# Patient Record
Sex: Male | Born: 1950 | Race: White | Hispanic: No | Marital: Married | Smoking: Former smoker
Health system: Southern US, Community
[De-identification: ages and names within clinical notes are randomized; demographics above are authoritative.]

## PROBLEM LIST (undated history)

## (undated) DIAGNOSIS — G4730 Sleep apnea, unspecified: Secondary | ICD-10-CM

## (undated) DIAGNOSIS — I10 Essential (primary) hypertension: Secondary | ICD-10-CM

## (undated) DIAGNOSIS — M1611 Unilateral primary osteoarthritis, right hip: Secondary | ICD-10-CM

## (undated) DIAGNOSIS — M1990 Unspecified osteoarthritis, unspecified site: Secondary | ICD-10-CM

## (undated) DIAGNOSIS — I4891 Unspecified atrial fibrillation: Secondary | ICD-10-CM

## (undated) HISTORY — PX: OTHER SURGICAL HISTORY: SHX169

## (undated) HISTORY — DX: Unspecified atrial fibrillation: I48.91

---

## 2000-03-26 ENCOUNTER — Other Ambulatory Visit: Admission: RE | Admit: 2000-03-26 | Discharge: 2000-03-26 | Payer: Self-pay | Admitting: Internal Medicine

## 2000-03-26 ENCOUNTER — Encounter (INDEPENDENT_AMBULATORY_CARE_PROVIDER_SITE_OTHER): Payer: Self-pay | Admitting: Specialist

## 2000-06-24 HISTORY — PX: CARPAL TUNNEL RELEASE: SHX101

## 2001-07-20 ENCOUNTER — Ambulatory Visit (HOSPITAL_BASED_OUTPATIENT_CLINIC_OR_DEPARTMENT_OTHER): Admission: RE | Admit: 2001-07-20 | Discharge: 2001-07-20 | Payer: Self-pay | Admitting: *Deleted

## 2001-08-12 ENCOUNTER — Ambulatory Visit (HOSPITAL_BASED_OUTPATIENT_CLINIC_OR_DEPARTMENT_OTHER): Admission: RE | Admit: 2001-08-12 | Discharge: 2001-08-12 | Payer: Self-pay | Admitting: *Deleted

## 2003-01-14 ENCOUNTER — Encounter: Admission: RE | Admit: 2003-01-14 | Discharge: 2003-01-14 | Payer: Self-pay | Admitting: Infectious Diseases

## 2003-01-27 ENCOUNTER — Encounter: Admission: RE | Admit: 2003-01-27 | Discharge: 2003-01-27 | Payer: Self-pay | Admitting: Infectious Diseases

## 2004-04-21 ENCOUNTER — Encounter: Admission: RE | Admit: 2004-04-21 | Discharge: 2004-04-21 | Payer: Self-pay | Admitting: Orthopedic Surgery

## 2004-04-24 ENCOUNTER — Encounter: Admission: RE | Admit: 2004-04-24 | Discharge: 2004-04-24 | Payer: Self-pay | Admitting: Orthopedic Surgery

## 2004-06-24 HISTORY — PX: KNEE ARTHROSCOPY: SUR90

## 2004-06-24 HISTORY — PX: BACK SURGERY: SHX140

## 2005-01-22 ENCOUNTER — Ambulatory Visit (HOSPITAL_COMMUNITY): Admission: RE | Admit: 2005-01-22 | Discharge: 2005-01-22 | Payer: Self-pay | Admitting: Orthopedic Surgery

## 2005-02-01 ENCOUNTER — Encounter (HOSPITAL_COMMUNITY): Admission: RE | Admit: 2005-02-01 | Discharge: 2005-03-03 | Payer: Self-pay | Admitting: Orthopedic Surgery

## 2005-02-15 ENCOUNTER — Encounter: Admission: RE | Admit: 2005-02-15 | Discharge: 2005-02-15 | Payer: Self-pay | Admitting: Neurosurgery

## 2005-02-19 ENCOUNTER — Ambulatory Visit (HOSPITAL_COMMUNITY): Admission: RE | Admit: 2005-02-19 | Discharge: 2005-02-20 | Payer: Self-pay | Admitting: Neurosurgery

## 2005-02-28 ENCOUNTER — Inpatient Hospital Stay (HOSPITAL_COMMUNITY): Admission: RE | Admit: 2005-02-28 | Discharge: 2005-03-01 | Payer: Self-pay | Admitting: Neurosurgery

## 2005-03-04 ENCOUNTER — Inpatient Hospital Stay (HOSPITAL_COMMUNITY): Admission: AD | Admit: 2005-03-04 | Discharge: 2005-03-14 | Payer: Self-pay | Admitting: Neurosurgery

## 2005-06-05 ENCOUNTER — Ambulatory Visit: Payer: Self-pay | Admitting: Internal Medicine

## 2006-02-06 ENCOUNTER — Ambulatory Visit: Payer: Self-pay | Admitting: Cardiology

## 2008-05-06 ENCOUNTER — Emergency Department (HOSPITAL_COMMUNITY): Admission: EM | Admit: 2008-05-06 | Discharge: 2008-05-06 | Payer: Self-pay | Admitting: Emergency Medicine

## 2010-09-24 ENCOUNTER — Other Ambulatory Visit: Payer: Self-pay | Admitting: Orthopedic Surgery

## 2010-09-24 DIAGNOSIS — M25561 Pain in right knee: Secondary | ICD-10-CM

## 2010-09-27 ENCOUNTER — Other Ambulatory Visit: Payer: Self-pay

## 2010-11-01 ENCOUNTER — Other Ambulatory Visit: Payer: Self-pay | Admitting: Neurosurgery

## 2010-11-01 DIAGNOSIS — M899 Disorder of bone, unspecified: Secondary | ICD-10-CM

## 2010-11-02 ENCOUNTER — Ambulatory Visit
Admission: RE | Admit: 2010-11-02 | Discharge: 2010-11-02 | Disposition: A | Discharge status: Home / Self Care | Payer: BC Managed Care – PPO | Source: Ambulatory Visit | Attending: Neurosurgery | Admitting: Neurosurgery

## 2010-11-02 DIAGNOSIS — M899 Disorder of bone, unspecified: Secondary | ICD-10-CM

## 2010-11-09 NOTE — H&P (Signed)
Timothy Barton, Timothy Barton              ACCOUNT NO.:  [id number]   MEDICAL RECORD NO.:  [id number]          PATIENT TYPE:  INP   LOCATION:                               FACILITY:  [facility]   PHYSICIAN:  Hilda Lias, M.D.   DATE OF BIRTH:  1950/11/08   DATE OF ADMISSION:  02/28/2005  DATE OF DISCHARGE:                                HISTORY & PHYSICAL   Mr. Bhakta is a gentleman who on August 29 underwent an L4-L5 diskectomy.  Patient did really well up to the point that he went home within 24 hours.  Over the weekend the wife called me because he was having some mild  headache.  He started taking some Percocet for pain and the headache got  worse.  He noticed some fluid coming through his wound and today he is  having an awful headache with more fluid up to the point I brought him  immediately to my office.  Because of the findings we are going to admit him  to the hospital for exploration of the lumbar wound.   PAST MEDICAL HISTORY:  Hand surgery, knee surgery, diskectomy a few days  ago.  He is not allergic to medication.   SOCIAL HISTORY:  Negative.   FAMILY HISTORY:  Unremarkable.   Patient is 5 feet 9 inches.  He is 330 pounds.   PHYSICAL EXAMINATION:  HEENT:  Unremarkable.  NECK:  Unremarkable.  CARDIOVASCULAR:  Unremarkable.  LUNGS:  Unremarkable.  ABDOMEN:  Unremarkable.  EXTREMITIES:  Normal pulses.  NEUROLOGIC:  Mental status unremarkable.  Cranial nerves unremarkable.  Strength unremarkable.  Sensation unremarkable.   IMPRESSION:  The lumbar wound shows that it is opening anterior and superior  and there is some CSF coming through.   CLINICAL IMPRESSION:  CSF leak.   RECOMMENDATIONS:  The patient has not eaten anything since this morning.  We  are going to admit to the hospital and take him for exploration of the  lumbar wound in the next few hours.           ______________________________  Hilda Lias, M.D.    EB/MEDQ  D:  02/27/2005  T:  02/27/2005   Job:  [id number]

## 2010-11-09 NOTE — Op Note (Signed)
[place]  Patient:    Timothy Barton, Timothy Barton Visit Number: [id number] MRN: [id number]          Service Type: DSU Location: [facility] Attending Physician:  Kendell Bane Dictated by:   Lowell Bouton, M.D. Proc. Date: 08/12/01 Admit Date:  08/12/2001 Discharge Date: 08/12/2001                             Operative Report  PREOPERATIVE DIAGNOSIS:  Right carpal tunnel syndrome.  POSTOPERATIVE DIAGNOSIS:  Right carpal tunnel syndrome.  PROCEDURE:  Decompression median nerve right carpal tunnel.  SURGEON:  Lowell Bouton, M.D.  ANESTHESIA:  0.5% Marcaine local with sedation.  OPERATIVE FINDINGS:  The patient had moderate compression of the median nerve. The were no masses identified and the motor branch was intact.  PROCEDURE:  Under 0.5% Marcaine local anesthesia with the tourniquet on the right arm, the right hand was prepped and draped in usual fashion.  After exsanguinating the limb, the tourniquet was inflated to 250 mmHg. A longitudinal incision was made in the palm just ulnar to the thenar crease measuring 3 cm in length.  Sharp dissection was carried through the subcutaneous tissues and bleeding points were coagulated.  Blunt dissection was carried through the superficial palmar fascia distal to the transverse carpal ligament and a hemostat was placed in the carpal canal up against the hook of the Hamate.   The transverse carpal ligament was then divided sharply on the ulnar border of the median nerve.  The proximal end of the ligament was divided with the scissors after dissecting the nerve away from the undersurface of the ligament.  The carpal canal was then palpated and was found to be adequately decompressed.  The nerve was examined and the motor branch identified.  The wound was irrigated with saline and the skin was closed with a 4-0 nylon suture.  Sterile dressings were applied, followed by a volar  wrist splint.  The patient tolerated the procedure well and went to the recovery room awake and stable in good condition. Dictated by:   Lowell Bouton, M.D. Attending Physician:  Kendell Bane DD:  08/12/01 TD:  08/13/01 Job: [id number]

## 2010-11-09 NOTE — Discharge Summary (Signed)
[name], [name]              ACCOUNT NO.:  [id number]   MEDICAL RECORD NO.:  [id number]          PATIENT TYPE:  INP   LOCATION:  3033                         FACILITY:  [facility]   PHYSICIAN:  Stefani Dama, M.D.  DATE OF BIRTH:  1951/03/06   DATE OF ADMISSION:  03/04/2005  DATE OF DISCHARGE:  03/14/2005                                 DISCHARGE SUMMARY   ADMITTING DIAGNOSIS:  Cerebrospinal fluid leak L3-L4 status post right L4  hemilaminectomy for disk herniation.   MAJOR OPERATION:  1.  Exploration of the lumbar wound.  2.  Right L4 hemilaminectomy.  3.  Repair of dura with 6-0 Prolene operating microscope microdissection      technique on March 08, 2005.   CONDITION ON DISCHARGE:  Improving.   HOSPITAL COURSE:  Mr. Timothy Barton is a 60 year old individual who  underwent surgical decompression and laminectomy at L3-L4 approximately 2  weeks before this admission.  He he had a CSF leak at the time; he had  required repair that CSF leak prior to this admission; he seemed to be doing  well with the wound being dry. However, on the day before his admission on  the 10th the patient noticed significant onset of headache and drainage. He  was brought to the [facility] and admitted on the 11th. After  placing a drain without drying the wound, he was taken to the operating room  on the 15th where he underwent surgical revision of the CSF leak with dural  closure. Postoperatively he had a drain placed while lying flat in bed.  The  drain was maintained for 4 days postoperatively. His drain was removed and  he was gradually mobilized on the 20th; and on the 21st he is ambulatory; he  is feeling well; and he would like to go home.  He has been using a minimal  amount of pain medication.   He is discharged home on Cipro 500 mg b.i.d. for 5 days.  His incision is  clean and dry at this time.   CONDITION ON DISCHARGE:  Stable.      Stefani Dama, M.D.  Electronically Signed     HJE/MEDQ  D:  03/14/2005  T:  03/15/2005  Job:  [id number]

## 2010-11-09 NOTE — Op Note (Signed)
[place]  Patient:    Timothy Barton, RABINE Visit Number: [id number] MRN: [id number]          Service Type: DSU Location: [facility] Attending Physician:  Kendell Bane Dictated by:   Lowell Bouton, M.D. Proc. Date: 07/20/01 Admit Date:  07/20/2001                             Operative Report  PREOPERATIVE DIAGNOSIS:  Left carpal tunnel syndrome.  POSTOPERATIVE DIAGNOSIS:  Left carpal tunnel syndrome.  OPERATION PERFORMED:  Decompression median nerve, left carpal tunnel.  SURGEON:  Lowell Bouton, M.D.  ANESTHESIA:  0.5% Marcaine local with sedation.  OPERATIVE FINDINGS:  The patient had significant pressure on the median nerve in the carpal canal; however, no masses were identified.  The motor branch of the nerve was intact.  DESCRIPTION OF PROCEDURE:  Under 0.5% Marcaine local anesthesia with a tourniquet on the left arm, the left hand was prepped and draped in the usual fashion and after exsanguinating the limb, the tourniquet was inflated to 225 mmHg.  A 3 cm longitudinal incision was made in the palm just ulnar to the thenar crease.  Sharp dissection was carried through the subcutaneous tissues and bleeding points were coagulated.  Blunt dissection was carried down through the superficial palmar fascia and hemostat was placed in the carpal canal up against the hook of the hamate.  The transverse carpal ligament was divided on the ulnar border of the median nerve.  The proximal end of the ligament was divided with the scissors after dissecting the nerve away from the undersurface of the ligament.  The carpal canal was then palpated and was found to be adequately decompressed.  The nerve was examined and motor branch identified.  The wound was irrigated with saline and the skin was closed with 4-0 nylon sutures.  Sterile dressings were applied followed by a volar wrist splint.  The patient tolerated the  procedure well and went to the recovery room awake and stable in good condition. Dictated by:   Lowell Bouton, M.D. Attending Physician:  Kendell Bane DD:  07/20/01 TD:  07/20/01 Job: [id number]

## 2010-11-09 NOTE — Op Note (Signed)
Timothy Barton, Timothy Barton              ACCOUNT NO.:  [id number]   MEDICAL RECORD NO.:  [id number]          PATIENT TYPE:  OIB   LOCATION:  2550                         FACILITY:  [facility]   PHYSICIAN:  Hilda Lias, M.D.   DATE OF BIRTH:  July 27, 1950   DATE OF PROCEDURE:  02/19/2005  DATE OF DISCHARGE:                                 OPERATIVE REPORT   PREOPERATIVE DIAGNOSIS:  Left L3-L4 herniated disc.   POSTOPERATIVE DIAGNOSIS:  Left L3-L4 herniated disc.   PROCEDURE:  Left L3-L4 foraminotomy, laminotomy, removal of six light  fragments, microscopy.   SURGEON:  Hilda Lias, M.D.   CLINICAL HISTORY:  The patient was admitted because of back and left leg  pain.  He had failed conservative treatment.  This gentleman is quite obese.  He had failed with conservative treatment and he wanted to proceed with  surgery.   DESCRIPTION OF PROCEDURE:  The patient was taken to the OR and he was  positioned in a prone manner.  We prepped the area with DuraPrep.  I was  unable to feel any bone whatsoever.  We introduced a needle and the needle  showed that we were at the level of L2.  From then on, we did the incision  below that through the skin through a thick layer of adipose tissue down to  cervical spine.  X-ray showed that one probe was at 2-3, the other was at 3-  4.  Then because we were unable to see well, we brought the microscope into  the area.  We identified the L3 and L4 lamina and using the drill, we did a  laminotomy.  A thick yellow ligament was excised.  Patient has a large  epidural vein.  Hemostasis was accomplished.  We retracted the dural sac and  there were about five to six light fragments compromising the L2 nerve root.  Removal was accomplished.  We looked at the disc space and it was completely  closed.  There was no need to do the discectomy.  At the end, the area was  irrigated.  Fentanyl and Depo-Medrol were left in the epidural space and the  wound was closed with  Vicryl and Steri-Strips.           ______________________________  Hilda Lias, M.D.     EB/MEDQ  D:  02/19/2005  T:  02/19/2005  Job:  [id number]

## 2010-11-09 NOTE — Op Note (Signed)
NAMEPROPHET, RENWICK              ACCOUNT NO.:  [id number]   MEDICAL RECORD NO.:  [id number]          PATIENT TYPE:  OIB   LOCATION:  2899                         FACILITY:  [facility]   PHYSICIAN:  Lenard Galloway. Mortenson, M.D.DATE OF BIRTH:  [birth year]   DATE OF PROCEDURE:  01/22/2005  DATE OF DISCHARGE:                                 OPERATIVE REPORT   PREOPERATIVE DIAGNOSIS:  Tear of posterior horn, medial meniscus, right  knee; chondromalacia and early osteoarthritis femoral notch, right knee.   POSTOPERATIVE DIAGNOSIS:  Tear of posterior horn, medial meniscus, right  knee; chondromalacia and early osteoarthritis femoral notch, right knee.   OPERATION PERFORMED:  Chondroplasty of femoral notch, right knee;  debridement of posterior horn, medial meniscus, right knee.   SURGEON:  Lenard Galloway. Chaney Malling, M.D.   ANESTHESIA:  MAC.   PATHOLOGY:  With the arthroscope in the knee, a very careful examination of  the right knee was undertaken.  There was some early chondromalacia in the  posterior aspect of the patella.  In the femoral notch area, there was grade  2 and grade 3 cartilage damage and fraying and tearing of articular  cartilage.  The arthroscope was then passed into the femoral notch area  distally and the anterior cruciate ligament was intact.  In the lateral  compartment, there was fairly normal articular cartilage over the lateral  femoral condyle and lateral tibial plateau.  There was some slight fraying  of the midportion of the lateral meniscus, otherwise this compartment  appeared fairly normal. The arthroscope was then passed into the medial  compartment.  The anterior two thirds of the medial meniscus was normal but  there was a tear of the meniscus at the junction of the posterior and mid  thirds of the medial meniscus.  The articular cartilage over the tibial  plateau appeared fairly normal but there was some tearing and fraying of  articular cartilage in the posterior  aspect of the femoral condyle near the  area of the torn medial meniscus.   DESCRIPTION OF PROCEDURE:  Patient placed on the operating table in supine  position with a pneumatic tourniquet about the right upper thigh.  The right  leg was placed in the leg holder, the right lower extremity was prepped with  DuraPrep and draped out in the usual manner.  An infusion cannula was placed  in the superior medial pouch and the knee distended with saline.  Anterior  medial and anterior lateral portals were made and the arthroscope was  introduced.  The findings were as described above.  The chondroplastic  shaver was inserted and the posterior aspect of the patella was debrided as  was the femoral notch area.  Once this was completed and loose cartilage was  removed from the femoral notch, the arthroscope was placed in the lateral  compartment.  There was slight fraying of the leading edge of the lateral  meniscus and this was debrided with a [employer].  The arthroscope  was then passed in the medial compartment.  There was some tear of the  posterior horn as above.  Through both medial and lateral portals, a series  of baskets was inserted and this area of torn portion was debrided.  The  intra-articular shaver was introduced and the remaining rim was then  smoothed and balanced, had a nice transition to the mid third of the medial  meniscus.  There was good stabilization of the rim.  There was some fraying  and tearing of articular cartilage over the posterior aspect of the medial  femoral condyle and this was also debrided with a chondroplastic shaver.  Marcaine was then placed in the knee and a large bulky compressive dressing  was applied.  The patient returned to the recovery room in excellent  condition. Technically, this procedure went extremely well.   DRAINS:  None.   COMPLICATIONS:  None.   I was very pleased with the surgical procedure.       RAM/MEDQ  D:   01/22/2005  T:  01/23/2005  Job:  [id number]

## 2010-11-09 NOTE — H&P (Signed)
NAMEANIK, WESCH              ACCOUNT NO.:  [id number]   MEDICAL RECORD NO.:  [id number]          PATIENT TYPE:  INP   LOCATION:  3015                         FACILITY:  [facility]   PHYSICIAN:  Hilda Lias, M.D.   DATE OF BIRTH:  17-Dec-1950   DATE OF ADMISSION:  03/04/2005  DATE OF DISCHARGE:                                HISTORY & PHYSICAL   Mr. Carroll is a gentleman who underwent diskectomy and repair of CSF leak.  Patient did well, went home with no headache and the wound was completely  dry.  Over the weekend he noticed a little bit of headache and his wife  noticed CSF.  Because of that we brought him immediately to the hospital for  further care.   PAST MEDICAL HISTORY:  Diskectomy and repair of CSF leak.   REVIEW OF SYSTEMS:  Back pain and headache.   He is not allergic to any medication.   PHYSICAL EXAMINATION:  HEENT:  Normal.  NECK:  Normal.  LUNGS:  Clear.  HEART:  Normal.  ABDOMEN:  Normal.  EXTREMITIES:  Normal.  NEUROLOGIC:  Essentially normal with no stiffening of the neck.   CLINICAL IMPRESSION:  Recurrent CSF leak.   RECOMMENDATIONS:  I talked to the patient and his wife.  Dr. Jean Rosenthal from  the anesthesia service is going to put a drain around L2.  We are going to  be draining 30 mL/hour for the next two to three days just to be sure that  there is no evidence of any more CSF leak.  At the present time I prefer not  to take him to surgery since it already did not help.  Nevertheless, if the  drain does not help to stop the leak than he is going to have lumbar  exploration at the end of the week.           ______________________________  Hilda Lias, M.D.     EB/MEDQ  D:  03/04/2005  T:  03/04/2005  Job:  [id number]

## 2010-11-09 NOTE — Op Note (Signed)
NAMEJESSEE, Timothy Barton              ACCOUNT NO.:  [id number]   MEDICAL RECORD NO.:  [id number]          PATIENT TYPE:  INP   LOCATION:  2550                         FACILITY:  [facility]   PHYSICIAN:  Hilda Lias, M.D.   DATE OF BIRTH:  1950/07/21   DATE OF PROCEDURE:  02/27/2005  DATE OF DISCHARGE:                                 OPERATIVE REPORT   PREOPERATIVE DIAGNOSIS:  Spinal headache, cerebrospinal fluid leak.   POSTOPERATIVE DIAGNOSIS:  Spinal headache, cerebrospinal fluid leak.   OPERATION PERFORMED:  Repair of cerebrospinal fluid leak.  Microscope.   SURGEON:  Hilda Lias, M.D.   ANESTHESIA:   INDICATIONS FOR PROCEDURE:  [name] is a gentleman who a week or eight  days ago underwent diskectomy.  The patient did excellently.  He went home  in less than 24 hours.  He is almost 350 pounds.  Over the weekend they  called me because he was having some headaches.  Today brought in because he  was draining some cerebrospinal fluid.  Because of that we decided to bring  him into the hospital for surgery.   DESCRIPTION OF PROCEDURE:  The patient was taken to the operating room and  he was positioned in a prone manner.  The patient was prepped with Betadine.  The previous incision was opened and the stitches were removed.  Immediately, we saw that indeed there was CSF in the epidural space.  A  specimen was sent to the laboratory to rule out a possibility of infection  associated with the CSF leak.  With the microscope, we fo8nd that indeed  there was a tiny pin hole right at the take off of the L4 nerve root.  A  single stitch of Prolene was used.  Valsalva maneuver up to 50 was negative.  Tisseel was left in the epidural space.  Fat was also put on top to help  with the possibility of recurrence.  From then on, the area was irrigated  and the wound was closed with Vicryl and nylon.           ______________________________  Hilda Lias, M.D.     EB/MEDQ  D:   02/27/2005  T:  02/28/2005  Job:  [id number]

## 2010-11-09 NOTE — Op Note (Signed)
Timothy Barton, Timothy Barton              ACCOUNT NO.:  [id number]   MEDICAL RECORD NO.:  [id number]          PATIENT TYPE:  INP   LOCATION:  2550                         FACILITY:  [facility]   PHYSICIAN:  Hilda Lias, M.D.   DATE OF BIRTH:  1951/05/02   DATE OF PROCEDURE:  03/08/2005  DATE OF DISCHARGE:                                 OPERATIVE REPORT   PREOPERATIVE DIAGNOSIS:  Cerebrospinal fluid leak, left 3-4.   POSTOPERATIVE DIAGNOSIS:  Cerebrospinal fluid leak, left 3-4.   PROCEDURE:  1.  Exploration of the lumbar wound.  2.  Right L4 hemilaminectomy.  3.  Repair of the dura mater opening with one single stitch of 6-0 Prolene.  4.  Microscopy.   SURGEON:  Hilda Lias, MD.   ASSISTANT:  Cristi Loron, MD.   CLINICAL HISTORY:  [name] is being readmitted to the operating room.  He has a lumbar diskectomy and then followed by a CSF leak.  He was  repaired.  The patient came back having the same problem.  A lumbar catheter  was inserted about four days ago.  We did our morning rounds this morning  and indeed there was still some CSF leak.  The tip of the catheter was below  the area of 3-4.  Because of that, we decided to bring him back to the  operating room.   DESCRIPTION OF PROCEDURE:  The patient was taken to the operating room and  we removed the L3 drain after we prepped the wound with Betadine.  The  incision following the previous one was made.  Immediately, plenty of CSF  was found in the subcutaneous space.  We brought the microscope into the  area.  We found a single opening right at the take-off of the L4 nerve root.  The previous suture was completely intact and there was no evidence of any  CSF leak coming through there.  Because the patient is quite obese, it was  difficult to put a stitch.  To give [place] more room and to investigate the  whole area, we removed the spinous process of L4 as well as the medial  aspect of the lamina of L4 in the right side.   Then, a single stitch of 6-0  Prolene was used.  It was tied in place.  Valsalva maneuver up to 50 was  essentially negative.  After that, we investigated the bone below and there  was no evidence of any more opening.  Tisseel was left in the epidural space  followed by ___________ and muscle.  The wound was closed with Vicryl and  nylon.  Prior to that, we introduced an LP drain medial through the yellow  ligament at the level of 2-3.  The catheter was inserted and brought out  through a separate incision about five inches above the wound.  The catheter  was draining and was secured in place.  We are going to secure this drain  with tape, and we are going to drain this constantly.          ______________________________  Hilda Lias, M.D.  EB/MEDQ  D:  03/08/2005  T:  03/09/2005  Job:  [id number]

## 2010-11-14 ENCOUNTER — Ambulatory Visit (HOSPITAL_COMMUNITY)
Admission: EM | Admit: 2010-11-14 | Discharge: 2010-11-14 | Disposition: A | Discharge status: Home / Self Care | Payer: BC Managed Care – PPO | Source: Other Acute Inpatient Hospital | Attending: Cardiology | Admitting: Cardiology

## 2010-11-14 ENCOUNTER — Emergency Department (HOSPITAL_COMMUNITY): Payer: BC Managed Care – PPO

## 2010-11-14 ENCOUNTER — Emergency Department (HOSPITAL_COMMUNITY)
Admission: EM | Admit: 2010-11-14 | Discharge: 2010-11-14 | Disposition: A | Discharge status: Other Acute Inpatient Hospital | Payer: BC Managed Care – PPO | Source: Home / Self Care | Attending: Emergency Medicine | Admitting: Emergency Medicine

## 2010-11-14 DIAGNOSIS — I10 Essential (primary) hypertension: Secondary | ICD-10-CM | POA: Insufficient documentation

## 2010-11-14 DIAGNOSIS — M1990 Unspecified osteoarthritis, unspecified site: Secondary | ICD-10-CM | POA: Insufficient documentation

## 2010-11-14 DIAGNOSIS — R079 Chest pain, unspecified: Secondary | ICD-10-CM | POA: Insufficient documentation

## 2010-11-14 DIAGNOSIS — G4733 Obstructive sleep apnea (adult) (pediatric): Secondary | ICD-10-CM | POA: Insufficient documentation

## 2010-11-14 DIAGNOSIS — I119 Hypertensive heart disease without heart failure: Secondary | ICD-10-CM | POA: Insufficient documentation

## 2010-11-14 DIAGNOSIS — I200 Unstable angina: Secondary | ICD-10-CM | POA: Insufficient documentation

## 2010-11-14 DIAGNOSIS — R61 Generalized hyperhidrosis: Secondary | ICD-10-CM | POA: Insufficient documentation

## 2010-11-14 LAB — APTT: aPTT: 30 seconds (ref 24–37)

## 2010-11-14 LAB — BASIC METABOLIC PANEL
BUN: 23 mg/dL (ref 6–23)
CO2: 27 mEq/L (ref 19–32)
Calcium: 9.1 mg/dL (ref 8.4–10.5)
Chloride: 99 mEq/L (ref 96–112)
Creatinine, Ser: 0.77 mg/dL (ref 0.4–1.5)
GFR calc Af Amer: >60 mL/min (ref >60)
GFR calc non Af Amer: >60 mL/min (ref >60)
Glucose, Bld: 95 mg/dL (ref 70–99)
Potassium: 5.5 mEq/L — ABNORMAL HIGH (ref 3.5–5.1)
Sodium: 137 mEq/L (ref 135–145)

## 2010-11-14 LAB — CK TOTAL AND CKMB (NOT AT ARMC)
CK, MB: 3 ng/mL (ref 0.3–4.0)
Relative Index: INVALID (ref 0.0–2.5)

## 2010-11-14 LAB — PROTIME-INR
INR: 1.07 (ref 0.00–1.49)
Prothrombin Time: 14.1 seconds (ref 11.6–15.2)

## 2010-11-14 LAB — CBC
HCT: 47 % (ref 39.0–52.0)
Hemoglobin: 15.8 g/dL (ref 13.0–17.0)
MCH: 30.5 pg (ref 26.0–34.0)
MCHC: 33.6 g/dL (ref 30.0–36.0)
MCV: 90.7 fL (ref 78.0–100.0)
Platelets: 141 10*3/uL — ABNORMAL LOW (ref 150–400)
RBC: 5.18 MIL/uL (ref 4.22–5.81)
RDW: 14.2 % (ref 11.5–15.5)
WBC: 11.4 10*3/uL — ABNORMAL HIGH (ref 4.0–10.5)

## 2010-11-14 LAB — DIFFERENTIAL
Basophils Absolute: 0 10*3/uL (ref 0.0–0.1)
Basophils Relative: 0 % (ref 0–1)
Eosinophils Absolute: 0 10*3/uL (ref 0.0–0.7)
Eosinophils Relative: 0 % (ref 0–5)
Lymphocytes Relative: 13 % (ref 12–46)
Lymphs Abs: 1.5 10*3/uL (ref 0.7–4.0)
Monocytes Absolute: 0.6 10*3/uL (ref 0.1–1.0)
Monocytes Relative: 5 % (ref 3–12)
Neutro Abs: 9.3 10*3/uL — ABNORMAL HIGH (ref 1.7–7.7)
Neutrophils Relative %: 82 % — ABNORMAL HIGH (ref 43–77)

## 2010-11-14 LAB — TROPONIN I: Troponin I: <0.3 ng/mL (ref <0.30)

## 2010-11-14 LAB — POCT CARDIAC MARKERS
CKMB, poc: 1.5 ng/mL (ref 1.0–8.0)
Myoglobin, poc: 84.5 ng/mL (ref 12–200)
Troponin i, poc: <0.05 ng/mL (ref 0.00–0.09)

## 2010-11-14 LAB — POTASSIUM: Potassium: 4.1 mEq/L (ref 3.5–5.1)

## 2010-12-18 NOTE — H&P (Signed)
Timothy Barton, Timothy Barton              ACCOUNT NO.:  [id number]  MEDICAL RECORD NO.:  [id number]           PATIENT TYPE:  I  LOCATION:  6522                         FACILITY:  [facility]  PHYSICIAN:  Pamella Pert, MD DATE OF BIRTH:  June 04, 1951  DATE OF ADMISSION:  11/14/2010 DATE OF DISCHARGE:  11/14/2010                             HISTORY & PHYSICAL   ADMISSION DIAGNOSIS: 1. Chest pain unstable angina. 2. Hypertension. 3. Degenerative joint disease. 4. Morbid obesity.  RECOMMENDATION:  The patient will be taken to the cardiac catheterization lab this afternoon to evaluate his coronary anatomy because of very worrisome chest discomfort associated with diaphoresis. I have discussed the risks, benefits, alternatives of cardiac catheterization including performing a pharmacological stress testing in the morning with the patient and his wife who is still at the bedside. They would wish to proceed with cardiac catheterization.  They do understand less than a percent risk of death, stroke, heart attack, urgent bypass, bleeding, infection, but not limited to these as major complications.  Further recommendations will follow after cardiac catheterization.  HISTORY OF PRESENT ILLNESS:  Timothy Barton is a pleasant 60 year old gentleman who has history of known degenerative joint disease.  He has been doing well until Monday, that is 4 days ago, when he was getting ready to go to work had developed burning sensation in his chest associated with mild diaphoresis.  He felt well within a few minutes and hence went back to work and did not have any problems since then.  Today while he was at work, the patient developed chest pressure and also burning sensation in the chest.  This was associated with marked diaphoresis.  He stated that his shirt got wet.  He stopped his activity and rested for little bit and then felt better.  He went back to resume his work and then had recurrence of  chest discomfort with marked diaphoresis.  Because of his worrisome chest pain, he presented to [facility] Emergency Department for further evaluation.  By the time he came to the emergency room, he was essentially chest pain free.  Presently, he denies any chest pain, shortness of breath, or palpitations.  REVIEW OF SYSTEMS:  He has degenerative joint disease and back pain.  He has sleep apnea and uses Z-Pak.  He is not a diabetic.  There is no recent bowel or bladder disturbances.  No history of GI bleed in the past.  No fever, nausea, vomiting.  Other systems are negative.  His home medications include hydrochlorothiazide.  ALLERGIES:  No known drug allergies.  PAST MEDICAL HISTORY:  As dictated above.  It is significant for degenerative joint disease, hypertension, and sleep apnea.  PAST SURGICAL HISTORY:  He has had back surgery in 2006.  FAMILY HISTORY:  There is no history of premature coronary artery disease in the family.  No history of diabetes in family.  SOCIAL HISTORY:  He is married, lives with his wife.  He quit smoking about 15 years ago.  Does not drink alcohol.  No history of illicit drug use.  He has sedentary lifestyle, although he works in the  [employer] which he states is pretty heavy work.  On physical exam, he is well-built and morbidly obese.  He appears to be in no acute distress.  His vital signs include a height of 5 feet and 9 inches, weight of 318 pounds, temperature of 98.7, pulse is 61% beats per minute and regular, respiration 14, blood pressure 129/62 mmHg. Cardiac exam S1 and S2 was normal without any gallops or murmur.  Chest was clear.  Abdomen is soft, obese.  No organomegaly.  Bowel sounds heard in all quadrants.  Extremities examination revealed full range of movement.  Fatty legs.  There was no evidence of eczema.  Full range of movements.  Peripheral arterial examination was normal with bilateral equal pulses.  There is no carotid  bruit.  There is no femoral arterial bruit.  His EKG was normal sinus rhythm.  His cardiac markers have been negative.  Point-of-care markers have been negative x1 along with CK, CK- MB, and troponin have been negative x1.  His CBC showed mild thrombocytopenia 141,000 but otherwise normal and BMP was within normal limits.  His chest x-ray was normal.  IMPRESSION:  Chest pain very worrisome for angina pectoris which as dictated above be taken to the cardiac catheterization lab and further evaluation will be continued and recommendations to follow.     Pamella Pert, MD     JRG/MEDQ  D:  11/14/2010  T:  11/15/2010  Job:  [id number]  cc:   Dierdre Highman. Loney Hering, MD  Electronically Signed by Yates Decamp MD on 12/18/2010 09:29:47 AM

## 2010-12-18 NOTE — Cardiovascular Report (Signed)
NAMEMARQUETT, BERTOLI              ACCOUNT NO.:  [id number]  MEDICAL RECORD NO.:  [id number]           PATIENT TYPE:  I  LOCATION:  6522                         FACILITY:  [facility]  PHYSICIAN:  Pamella Pert, MD DATE OF BIRTH:  July 27, 1950  DATE OF PROCEDURE:  11/14/2010 DATE OF DISCHARGE:  11/14/2010                           CARDIAC CATHETERIZATION   PROCEDURE PERFORMED: 1. Left ventriculography. 2. Selected left coronary arteriography.  INDICATIONS:  Mr. Timothy Barton is a 60 year old gentleman with history of hypertension.  He has normal lipid, has obstructive sleep apnea, and degenerative joint disease.  He was admitted to the hospital via emergency room with the burning sensation in the chest associated with diaphoresis with exertion.  His physical examination revealed him to be morbidly obese in no acute distress.  No significant other findings were evident.  His EKG was normal.  His cardiac markers were negative formyocardial injury.  Because of his classic presentation to presentation with chest pain after discussions regarding risks, benefits, alternatives, the patient wants to proceed with cardiac catheterization. Hence he was brought to the cardiac cath lab the same day as admission for evaluation of his coronary anatomy.  HEMODYNAMIC DATA:  The left ventricular pressure was 112/10 with end- diastolic pressure of 23 mmHg.  Aortic pressure was 112/65 with a mean of 85 mmHg.  There was no pressure gradient across the aortic valve.  ANGIOGRAPHIC DATA:  Left ventricle:  Left ventricular systolic function was normal with ejection fraction of 55-60% without any regional wall motion abnormality.  Mitral regurgitation could not be well delineated as it was a hand contrast injection.  Right coronary artery:  Right coronary artery is a large caliber vessel. It is codominant with circumflex coronary artery.  There is mild luminal irregularity.  Left main coronary artery:   Left main coronary artery is a large caliber vessel .  It is at least 5-6 mm in diameter.  There is about 10-20% luminal irregularity.  Circumflex coronary artery:  Circumflex coronary artery is codominant with right coronary artery.  It gives origin to small obtuse marginal 1 and 2 and a large obtuse marginal 3 which gives PDA branches.  There is no significant stenoses evident in this circumflex coronary artery.  LAD:  LAD is a large caliber vessel with very minimal luminal irregularity.  Mid segment of the LAD has a very mild intramyocardial bridging.  IMPRESSION: 1. No significant coronary artery disease by cardiac catheterization.     Very large vessel measuring anywhere from 4-6 mm.  All the major     vessels measured anywhere from 4-6 mm in diameter.  Normal left     ventricular systolic function. 2. Elevated left ventricular end-diastolic pressure secondary to     hypertension and hypertensive heart disease and morbid obesity.  RECOMMENDATIONS:  The patient will be discharged home today.  He will continue with his Diovan HCT at his home dose and will also be started on aspirin at 325 mg once a day along with a PPI, Protonix 40 mg once a day.  I will see him back in the office in about 2 weeks.  I advised him to be off work until Tuesday.  If he has recurrent chest pain, the patient is willing to go back to the emergency room.  He prefers to go home rather than staying in the hospital.  I discussed these with the patient and his wife and family.  They are all in agreement.  A total of 100 mL of contrast was utilized for diagnostic angiography.  TECHNIQUE OF THE PROCEDURE:  Under sterile precautions using a 6-French right radial access, a 6-French TIG #4 catheter was advanced into ascending aorta and selective left coronary arteriography was performed. The catheter was then advanced into the left ventricle.  Left ventriculography was performed both in LAO and RAO projection  and catheter pulled into the ascending aorta.  Right coronary artery selective engaged and angiography was performed.  Catheter was then pulled out of body.  Catheter exchange was done over a safety J-wire.  Hemostasis was obtained by applying TR band.  The patient tolerated the procedure well.  No immediate complications.     Pamella Pert, MD     JRG/MEDQ  D:  11/14/2010  T:  11/15/2010  Job:  [id number]  cc:   Ernestine Conrad  Electronically Signed by Yates Decamp MD on 12/18/2010 09:29:54 AM

## 2010-12-25 ENCOUNTER — Emergency Department (HOSPITAL_COMMUNITY)
Admission: EM | Admit: 2010-12-25 | Discharge: 2010-12-26 | Disposition: A | Discharge status: Home / Self Care | Payer: BC Managed Care – PPO | Attending: Emergency Medicine | Admitting: Emergency Medicine

## 2010-12-25 DIAGNOSIS — H81319 Aural vertigo, unspecified ear: Secondary | ICD-10-CM | POA: Insufficient documentation

## 2010-12-25 DIAGNOSIS — Z79899 Other long term (current) drug therapy: Secondary | ICD-10-CM | POA: Insufficient documentation

## 2010-12-25 DIAGNOSIS — I10 Essential (primary) hypertension: Secondary | ICD-10-CM | POA: Insufficient documentation

## 2010-12-26 ENCOUNTER — Emergency Department (HOSPITAL_COMMUNITY): Payer: BC Managed Care – PPO

## 2010-12-26 LAB — BASIC METABOLIC PANEL
CO2: 28 mEq/L (ref 19–32)
Chloride: 106 mEq/L (ref 96–112)
GFR calc Af Amer: >60 mL/min (ref >60)
Potassium: 3.9 mEq/L (ref 3.5–5.1)
Sodium: 139 mEq/L (ref 135–145)

## 2010-12-26 LAB — DIFFERENTIAL
Basophils Absolute: 0 10*3/uL (ref 0.0–0.1)
Eosinophils Absolute: 0 10*3/uL (ref 0.0–0.7)
Eosinophils Relative: 0 % (ref 0–5)
Lymphs Abs: 0.5 10*3/uL — ABNORMAL LOW (ref 0.7–4.0)
Neutrophils Relative %: 87 % — ABNORMAL HIGH (ref 43–77)

## 2010-12-26 LAB — CBC
MCV: 92.5 fL (ref 78.0–100.0)
Platelets: 87 10*3/uL — ABNORMAL LOW (ref 150–400)
RBC: 4.26 MIL/uL (ref 4.22–5.81)
RDW: 14.3 % (ref 11.5–15.5)
WBC: 5.5 10*3/uL (ref 4.0–10.5)

## 2012-06-19 ENCOUNTER — Encounter (HOSPITAL_COMMUNITY): Payer: Self-pay | Admitting: Pharmacy Technician

## 2012-06-25 ENCOUNTER — Other Ambulatory Visit (HOSPITAL_COMMUNITY): Payer: BC Managed Care – PPO

## 2012-06-30 ENCOUNTER — Other Ambulatory Visit: Payer: Self-pay | Admitting: Physician Assistant

## 2012-07-01 ENCOUNTER — Encounter (HOSPITAL_COMMUNITY)
Admission: RE | Admit: 2012-07-01 | Discharge: 2012-07-01 | Disposition: A | Discharge status: Home / Self Care | Payer: BC Managed Care – PPO | Source: Ambulatory Visit | Attending: Orthopedic Surgery | Admitting: Orthopedic Surgery

## 2012-07-01 ENCOUNTER — Encounter (HOSPITAL_COMMUNITY): Payer: Self-pay

## 2012-07-01 HISTORY — DX: Unspecified osteoarthritis, unspecified site: M19.90

## 2012-07-01 HISTORY — DX: Essential (primary) hypertension: I10

## 2012-07-01 HISTORY — DX: Sleep apnea, unspecified: G47.30

## 2012-07-01 LAB — COMPREHENSIVE METABOLIC PANEL
ALT: 45 U/L (ref 0–53)
Albumin: 4 g/dL (ref 3.5–5.2)
Calcium: 9.9 mg/dL (ref 8.4–10.5)
GFR calc Af Amer: >90 mL/min (ref >90)
Glucose, Bld: 84 mg/dL (ref 70–99)
Sodium: 137 mEq/L (ref 135–145)
Total Protein: 7.6 g/dL (ref 6.0–8.3)

## 2012-07-01 LAB — CBC WITH DIFFERENTIAL/PLATELET
Basophils Absolute: 0 10*3/uL (ref 0.0–0.1)
Basophils Relative: 0 % (ref 0–1)
Eosinophils Absolute: 0.1 10*3/uL (ref 0.0–0.7)
Eosinophils Relative: 1 % (ref 0–5)
MCH: 30.8 pg (ref 26.0–34.0)
MCHC: 34 g/dL (ref 30.0–36.0)
MCV: 90.3 fL (ref 78.0–100.0)
Platelets: 183 10*3/uL (ref 150–400)
RDW: 13.6 % (ref 11.5–15.5)

## 2012-07-01 LAB — URINALYSIS, ROUTINE W REFLEX MICROSCOPIC
Glucose, UA: NEGATIVE mg/dL
Ketones, ur: NEGATIVE mg/dL
Leukocytes, UA: NEGATIVE
Nitrite: NEGATIVE
Specific Gravity, Urine: 1.026 (ref 1.005–1.030)
pH: 5.5 (ref 5.0–8.0)

## 2012-07-01 LAB — ABO/RH: ABO/RH(D): O POS

## 2012-07-01 LAB — TYPE AND SCREEN
ABO/RH(D): O POS
Antibody Screen: NEGATIVE

## 2012-07-01 LAB — SURGICAL PCR SCREEN: MRSA, PCR: NEGATIVE

## 2012-07-01 MED ORDER — CHLORHEXIDINE GLUCONATE 4 % EX LIQD: 60.0000 mL | Freq: Once | CUTANEOUS | Status: DC

## 2012-07-01 NOTE — Pre-Procedure Instructions (Signed)
20 TILAK OAKLEY  07/01/2012   Your procedure is scheduled on:  Friday  07/03/12   Report to [facility] at 530 AM.  Call this number if you have problems the morning of surgery: [phone]   Remember:   Do not eat food OR DRINK :After Midnight.   Take these medicines the morning of surgery with A SIP OF WATER: TYLENOL OR HYDROCODONE IF NEEDED   Do not wear jewelry, make-up or nail polish.  Do not wear lotions, powders, or perfumes. You may wear deodorant.  Do not shave 48 hours prior to surgery. Men may shave face and neck.  Do not bring valuables to the hospital.  Contacts, dentures or bridgework may not be worn into surgery.  Leave suitcase in the car. After surgery it may be brought to your room.  For patients admitted to the hospital, checkout time is 11:00 AM the day of discharge.   Patients discharged the day of surgery will not be allowed to drive home.  Name and phone number of your driver:  Special Instructions: Shower using CHG 2 nights before surgery and the night before surgery.  If you shower the day of surgery use CHG.  Use special wash - you have one bottle of CHG for all showers.  You should use approximately 1/3 of the bottle for each shower.   Please read over the following fact sheets that you were given: Pain Booklet, Coughing and Deep Breathing, Blood Transfusion Information, Total Joint Packet, MRSA Information and Surgical Site Infection Prevention

## 2012-07-02 LAB — URINE CULTURE: Colony Count: 40000

## 2012-07-02 MED ORDER — DEXTROSE 5 % IV SOLN
3.0000 g | INTRAVENOUS | Status: AC
  Administered 2012-07-03: 3 g via INTRAVENOUS
  Filled 2012-07-02: qty 3000

## 2012-07-02 NOTE — H&P (Signed)
TOTAL HIP ADMISSION H&P  Patient is admitted for right total hip arthroplasty.  Subjective:  Chief Complaint: right hip pain  HPI: Timothy Barton, 62 y.o. male, has a history of pain and functional disability in the right hip(s) due to arthritis and patient has failed non-surgical conservative treatments for greater than 12 weeks to include NSAID's and/or analgesics, corticosteriod injections and activity modification.  Onset of symptoms was gradual with gradually worsening course.The patient noted no past surgery on the right hip(s).  Patient currently rates pain in the right hip at 10 out of 10 with activity. Patient has night pain, worsening of pain with activity and weight bearing, pain that interfers with activities of daily living and pain with passive range of motion. Patient has evidence of periarticular osteophytes and joint space narrowing by imaging studies. This condition presents safety issues increasing the risk of falls. There is no current active infection.  There are no active problems to display for this patient.  Past Medical History  Diagnosis Date  . Hypertension   . Sleep apnea   . Arthritis     Past Surgical History  Procedure Date  . Carpal tunnel release 2002    right and left  . Arthrosopy   . Knee arthroscopy 2006    right  . Back surgery 2006     ruptured disc,and repair of leak x3     (Not in a hospital admission) No Known Allergies  History  Substance Use Topics  . Smoking status: Former Smoker -- 1.5 packs/day for 21 years    Quit date: 07/01/1998  . Smokeless tobacco: Former Neurosurgeon    Types: Chew    Quit date: 07/01/1982  . Alcohol Use: No    No family history on file.   Review of Systems  Constitutional: Negative.   HENT: Negative.   Eyes: Negative.   Respiratory: Negative.   Cardiovascular: Negative.   Gastrointestinal: Negative.   Genitourinary: Negative.   Musculoskeletal: Positive for back pain and joint pain. Negative for falls.    Skin: Negative.   Neurological: Negative.   Endo/Heme/Allergies: Negative.   Psychiatric/Behavioral: Negative for depression and substance abuse.    Objective:  Physical Exam  Constitutional: He is oriented to person, place, and time. He appears well-developed and well-nourished. No distress.  HENT:  Head: Normocephalic and atraumatic.  Nose: Nose normal.  Eyes: Conjunctivae normal and EOM are normal. Pupils are equal, round, and reactive to light.  Neck: Normal range of motion. Neck supple.  Cardiovascular: Normal rate, regular rhythm, normal heart sounds and intact distal pulses.   No murmur heard. Respiratory: Effort normal and breath sounds normal. No respiratory distress. He has no wheezes.  GI: Soft. Bowel sounds are normal. He exhibits no distension. There is no tenderness.       obese  Musculoskeletal:       Right hip: He exhibits decreased range of motion, decreased strength, tenderness and bony tenderness.       bilat LE NVI, intact DF and PF ankles, no calf tenderness  Neurological: He is alert and oriented to person, place, and time. No cranial nerve deficit.  Skin: Skin is warm and dry. No rash noted. No erythema.  Psychiatric: He has a normal mood and affect. His behavior is normal.    Vital signs in last 24 hours: @VSRANGES @  Labs:  Ht 5'6 wt 324 BMI approx 50  Imaging Review Plain radiographs demonstrate severe degenerative joint disease of the right hip(s). The bone quality  appears to be good for age and reported activity level.  Assessment/Plan:  End stage arthritis, right hip(s)  The patient history, physical examination, clinical judgement of the provider and imaging studies are consistent with end stage degenerative joint disease of the right hip(s) and total hip arthroplasty is deemed medically necessary. The treatment options including medical management, injection therapy, arthroscopy and arthroplasty were discussed at length. The risks and benefits  of total hip arthroplasty were presented and reviewed. The risks due to aseptic loosening, infection, stiffness, dislocation/subluxation,  thromboembolic complications and other imponderables were discussed.  The patient acknowledged the explanation, agreed to proceed with the plan and consent was signed. Patient is being admitted for inpatient treatment for surgery, pain control, PT, OT, prophylactic antibiotics, VTE prophylaxis, progressive ambulation and ADL's and discharge planning.The patient is planning to be discharged home with home health services

## 2012-07-03 ENCOUNTER — Ambulatory Visit (HOSPITAL_COMMUNITY): Payer: BC Managed Care – PPO | Admitting: Anesthesiology

## 2012-07-03 ENCOUNTER — Encounter (HOSPITAL_COMMUNITY): Payer: Self-pay | Admitting: Orthopedic Surgery

## 2012-07-03 ENCOUNTER — Encounter (HOSPITAL_COMMUNITY): Payer: Self-pay | Admitting: Anesthesiology

## 2012-07-03 ENCOUNTER — Inpatient Hospital Stay (HOSPITAL_COMMUNITY): Payer: BC Managed Care – PPO

## 2012-07-03 ENCOUNTER — Inpatient Hospital Stay (HOSPITAL_COMMUNITY)
Admission: RE | Admit: 2012-07-03 | Discharge: 2012-07-07 | DRG: 818 | Disposition: A | Discharge status: Home Health | Payer: BC Managed Care – PPO | Source: Ambulatory Visit | Attending: Orthopedic Surgery | Admitting: Orthopedic Surgery

## 2012-07-03 ENCOUNTER — Encounter (HOSPITAL_COMMUNITY): Payer: Self-pay | Admitting: *Deleted

## 2012-07-03 ENCOUNTER — Encounter (HOSPITAL_COMMUNITY)
Admission: RE | Disposition: A | Discharge status: Home Health | Payer: Self-pay | Source: Ambulatory Visit | Attending: Orthopedic Surgery

## 2012-07-03 DIAGNOSIS — M1610 Unilateral primary osteoarthritis, unspecified hip: Principal | ICD-10-CM | POA: Diagnosis present

## 2012-07-03 DIAGNOSIS — N289 Disorder of kidney and ureter, unspecified: Secondary | ICD-10-CM | POA: Diagnosis not present

## 2012-07-03 DIAGNOSIS — D62 Acute posthemorrhagic anemia: Secondary | ICD-10-CM | POA: Diagnosis present

## 2012-07-03 DIAGNOSIS — Z01812 Encounter for preprocedural laboratory examination: Secondary | ICD-10-CM

## 2012-07-03 DIAGNOSIS — M1611 Unilateral primary osteoarthritis, right hip: Secondary | ICD-10-CM | POA: Diagnosis present

## 2012-07-03 DIAGNOSIS — Z87891 Personal history of nicotine dependence: Secondary | ICD-10-CM

## 2012-07-03 DIAGNOSIS — Z01810 Encounter for preprocedural cardiovascular examination: Secondary | ICD-10-CM

## 2012-07-03 DIAGNOSIS — Z6841 Body Mass Index (BMI) 40.0 and over, adult: Secondary | ICD-10-CM

## 2012-07-03 DIAGNOSIS — G4733 Obstructive sleep apnea (adult) (pediatric): Secondary | ICD-10-CM | POA: Diagnosis present

## 2012-07-03 DIAGNOSIS — Z01818 Encounter for other preprocedural examination: Secondary | ICD-10-CM

## 2012-07-03 DIAGNOSIS — M169 Osteoarthritis of hip, unspecified: Principal | ICD-10-CM | POA: Diagnosis present

## 2012-07-03 DIAGNOSIS — L259 Unspecified contact dermatitis, unspecified cause: Secondary | ICD-10-CM | POA: Diagnosis not present

## 2012-07-03 DIAGNOSIS — I10 Essential (primary) hypertension: Secondary | ICD-10-CM | POA: Diagnosis present

## 2012-07-03 HISTORY — PX: TOTAL HIP ARTHROPLASTY: SHX124

## 2012-07-03 HISTORY — DX: Unilateral primary osteoarthritis, right hip: M16.11

## 2012-07-03 LAB — CREATININE, SERUM
Creatinine, Ser: 0.9 mg/dL (ref 0.50–1.35)
GFR calc Af Amer: >90 mL/min (ref >90)
GFR calc non Af Amer: >90 mL/min (ref >90)

## 2012-07-03 LAB — CBC
Platelets: 177 10*3/uL (ref 150–400)
RBC: 4.05 MIL/uL — ABNORMAL LOW (ref 4.22–5.81)
WBC: 20.5 10*3/uL — ABNORMAL HIGH (ref 4.0–10.5)

## 2012-07-03 SURGERY — ARTHROPLASTY, HIP, TOTAL,POSTERIOR APPROACH
Anesthesia: General | Site: Hip | Laterality: Right | Wound class: Clean

## 2012-07-03 MED ORDER — LIDOCAINE HCL (CARDIAC) 20 MG/ML IV SOLN
INTRAVENOUS | Status: DC | PRN
  Administered 2012-07-03: 100 mg via INTRAVENOUS

## 2012-07-03 MED ORDER — VALSARTAN-HYDROCHLOROTHIAZIDE 320-12.5 MG PO TABS: 1.0000 | ORAL_TABLET | Freq: Every day | ORAL | Status: DC

## 2012-07-03 MED ORDER — CEFAZOLIN SODIUM-DEXTROSE 2-3 GM-% IV SOLR
2.0000 g | Freq: Four times a day (QID) | INTRAVENOUS | Status: AC
  Administered 2012-07-03 (×2): 2 g via INTRAVENOUS
  Filled 2012-07-03 (×2): qty 50

## 2012-07-03 MED ORDER — ENOXAPARIN SODIUM 30 MG/0.3ML ~~LOC~~ SOLN
30.0000 mg | Freq: Two times a day (BID) | SUBCUTANEOUS | Status: DC
  Administered 2012-07-04 – 2012-07-06 (×5): 30 mg via SUBCUTANEOUS
  Filled 2012-07-03 (×7): qty 0.3

## 2012-07-03 MED ORDER — SODIUM CHLORIDE 0.9 % IR SOLN
Status: DC | PRN
  Administered 2012-07-03: 1000 mL

## 2012-07-03 MED ORDER — MENTHOL 3 MG MT LOZG: 1.0000 | LOZENGE | OROMUCOSAL | Status: DC | PRN

## 2012-07-03 MED ORDER — ACETAMINOPHEN 325 MG PO TABS
650.0000 mg | ORAL_TABLET | Freq: Four times a day (QID) | ORAL | Status: DC | PRN
  Administered 2012-07-04 – 2012-07-05 (×2): 650 mg via ORAL
  Filled 2012-07-03 (×2): qty 2

## 2012-07-03 MED ORDER — SODIUM CHLORIDE 0.9 % IV SOLN
INTRAVENOUS | Status: DC
  Administered 2012-07-04: 02:00:00 via INTRAVENOUS

## 2012-07-03 MED ORDER — PROPOFOL 10 MG/ML IV BOLUS
INTRAVENOUS | Status: DC | PRN
  Administered 2012-07-03: 200 mg via INTRAVENOUS

## 2012-07-03 MED ORDER — ONDANSETRON HCL 4 MG/2ML IJ SOLN
INTRAMUSCULAR | Status: DC | PRN
  Administered 2012-07-03: 4 mg via INTRAVENOUS

## 2012-07-03 MED ORDER — OXYCODONE HCL 5 MG PO TABS
5.0000 mg | ORAL_TABLET | ORAL | Status: DC | PRN
  Administered 2012-07-03 – 2012-07-07 (×10): 10 mg via ORAL
  Filled 2012-07-03 (×10): qty 2

## 2012-07-03 MED ORDER — MIDAZOLAM HCL 5 MG/5ML IJ SOLN
INTRAMUSCULAR | Status: DC | PRN
  Administered 2012-07-03: 2 mg via INTRAVENOUS

## 2012-07-03 MED ORDER — ALBUMIN HUMAN 5 % IV SOLN
INTRAVENOUS | Status: DC | PRN
  Administered 2012-07-03: 09:00:00 via INTRAVENOUS

## 2012-07-03 MED ORDER — PHENYLEPHRINE HCL 10 MG/ML IJ SOLN
INTRAMUSCULAR | Status: DC | PRN
  Administered 2012-07-03 (×2): 40 ug via INTRAVENOUS

## 2012-07-03 MED ORDER — SODIUM CHLORIDE 0.9 % IV SOLN: INTRAVENOUS | Status: DC

## 2012-07-03 MED ORDER — IRBESARTAN 300 MG PO TABS
300.0000 mg | ORAL_TABLET | Freq: Every day | ORAL | Status: DC
  Administered 2012-07-03 – 2012-07-07 (×5): 300 mg via ORAL
  Filled 2012-07-03 (×5): qty 1

## 2012-07-03 MED ORDER — GLYCOPYRROLATE 0.2 MG/ML IJ SOLN
INTRAMUSCULAR | Status: DC | PRN
  Administered 2012-07-03: .8 mg via INTRAVENOUS

## 2012-07-03 MED ORDER — SENNOSIDES-DOCUSATE SODIUM 8.6-50 MG PO TABS
1.0000 | ORAL_TABLET | Freq: Every evening | ORAL | Status: DC | PRN
  Administered 2012-07-04: 1 via ORAL
  Filled 2012-07-03: qty 1

## 2012-07-03 MED ORDER — OXYCODONE HCL 5 MG PO TABS: ORAL_TABLET | ORAL | Status: DC

## 2012-07-03 MED ORDER — ACETAMINOPHEN 10 MG/ML IV SOLN
INTRAVENOUS | Status: DC | PRN
  Administered 2012-07-03: 1000 mg via INTRAVENOUS

## 2012-07-03 MED ORDER — DOCUSATE SODIUM 100 MG PO CAPS
100.0000 mg | ORAL_CAPSULE | Freq: Two times a day (BID) | ORAL | Status: DC
  Administered 2012-07-03 – 2012-07-07 (×6): 100 mg via ORAL
  Filled 2012-07-03 (×10): qty 1

## 2012-07-03 MED ORDER — PHENOL 1.4 % MT LIQD: 1.0000 | OROMUCOSAL | Status: DC | PRN

## 2012-07-03 MED ORDER — FENTANYL CITRATE 0.05 MG/ML IJ SOLN
INTRAMUSCULAR | Status: AC
  Filled 2012-07-03: qty 2

## 2012-07-03 MED ORDER — LACTATED RINGERS IV SOLN
INTRAVENOUS | Status: DC | PRN
  Administered 2012-07-03 (×2): via INTRAVENOUS

## 2012-07-03 MED ORDER — SUCCINYLCHOLINE CHLORIDE 20 MG/ML IJ SOLN
INTRAMUSCULAR | Status: DC | PRN
  Administered 2012-07-03: 140 mg via INTRAVENOUS

## 2012-07-03 MED ORDER — METOCLOPRAMIDE HCL 5 MG/ML IJ SOLN: 5.0000 mg | Freq: Three times a day (TID) | INTRAMUSCULAR | Status: DC | PRN

## 2012-07-03 MED ORDER — ONDANSETRON HCL 4 MG PO TABS: 4.0000 mg | ORAL_TABLET | Freq: Four times a day (QID) | ORAL | Status: DC | PRN

## 2012-07-03 MED ORDER — FENTANYL CITRATE 0.05 MG/ML IJ SOLN
25.0000 ug | INTRAMUSCULAR | Status: DC | PRN
  Administered 2012-07-03 (×2): 50 ug via INTRAVENOUS

## 2012-07-03 MED ORDER — ACETAMINOPHEN 10 MG/ML IV SOLN
1000.0000 mg | Freq: Four times a day (QID) | INTRAVENOUS | Status: AC
  Administered 2012-07-03 – 2012-07-04 (×3): 1000 mg via INTRAVENOUS
  Filled 2012-07-03 (×4): qty 100

## 2012-07-03 MED ORDER — METHOCARBAMOL 500 MG PO TABS
500.0000 mg | ORAL_TABLET | Freq: Four times a day (QID) | ORAL | Status: DC | PRN
  Administered 2012-07-03 – 2012-07-04 (×3): 500 mg via ORAL
  Filled 2012-07-03 (×2): qty 1

## 2012-07-03 MED ORDER — OXYCODONE HCL 5 MG PO TABS
ORAL_TABLET | ORAL | Status: AC
  Filled 2012-07-03: qty 2

## 2012-07-03 MED ORDER — BISACODYL 10 MG RE SUPP: 10.0000 mg | Freq: Every day | RECTAL | Status: DC | PRN

## 2012-07-03 MED ORDER — ACETAMINOPHEN 10 MG/ML IV SOLN: 1000.0000 mg | Freq: Four times a day (QID) | INTRAVENOUS | Status: DC

## 2012-07-03 MED ORDER — HYDROCHLOROTHIAZIDE 12.5 MG PO CAPS
12.5000 mg | ORAL_CAPSULE | Freq: Every day | ORAL | Status: DC
  Administered 2012-07-03 – 2012-07-07 (×5): 12.5 mg via ORAL
  Filled 2012-07-03 (×5): qty 1

## 2012-07-03 MED ORDER — ONDANSETRON HCL 4 MG/2ML IJ SOLN: 4.0000 mg | Freq: Four times a day (QID) | INTRAMUSCULAR | Status: DC | PRN

## 2012-07-03 MED ORDER — ARTIFICIAL TEARS OP OINT
TOPICAL_OINTMENT | OPHTHALMIC | Status: DC | PRN
  Administered 2012-07-03: 1 via OPHTHALMIC

## 2012-07-03 MED ORDER — HYDROMORPHONE HCL PF 1 MG/ML IJ SOLN
1.0000 mg | INTRAMUSCULAR | Status: DC | PRN
  Administered 2012-07-03 – 2012-07-04 (×3): 1 mg via INTRAVENOUS
  Filled 2012-07-03 (×2): qty 1

## 2012-07-03 MED ORDER — FLEET ENEMA 7-19 GM/118ML RE ENEM: 1.0000 | ENEMA | Freq: Once | RECTAL | Status: AC | PRN

## 2012-07-03 MED ORDER — METOCLOPRAMIDE HCL 10 MG PO TABS: 5.0000 mg | ORAL_TABLET | Freq: Three times a day (TID) | ORAL | Status: DC | PRN

## 2012-07-03 MED ORDER — METHOCARBAMOL 100 MG/ML IJ SOLN
500.0000 mg | Freq: Four times a day (QID) | INTRAVENOUS | Status: DC | PRN
  Filled 2012-07-03: qty 5

## 2012-07-03 MED ORDER — ACETAMINOPHEN 10 MG/ML IV SOLN
INTRAVENOUS | Status: AC
  Filled 2012-07-03: qty 100

## 2012-07-03 MED ORDER — CHLORHEXIDINE GLUCONATE 4 % EX LIQD: 60.0000 mL | Freq: Once | CUTANEOUS | Status: DC

## 2012-07-03 MED ORDER — METHOCARBAMOL 750 MG PO TABS: 750.0000 mg | ORAL_TABLET | Freq: Four times a day (QID) | ORAL | Status: DC

## 2012-07-03 MED ORDER — OXYCODONE HCL 5 MG PO TABS: 5.0000 mg | ORAL_TABLET | Freq: Once | ORAL | Status: DC | PRN

## 2012-07-03 MED ORDER — FENTANYL CITRATE 0.05 MG/ML IJ SOLN
INTRAMUSCULAR | Status: DC | PRN
  Administered 2012-07-03 (×2): 100 ug via INTRAVENOUS
  Administered 2012-07-03 (×6): 50 ug via INTRAVENOUS

## 2012-07-03 MED ORDER — ENOXAPARIN SODIUM 30 MG/0.3ML ~~LOC~~ SOLN: 30.0000 mg | Freq: Two times a day (BID) | SUBCUTANEOUS | Status: DC

## 2012-07-03 MED ORDER — NEOSTIGMINE METHYLSULFATE 1 MG/ML IJ SOLN
INTRAMUSCULAR | Status: DC | PRN
  Administered 2012-07-03: 5 mg via INTRAVENOUS

## 2012-07-03 MED ORDER — OXYCODONE HCL 5 MG/5ML PO SOLN: 5.0000 mg | Freq: Once | ORAL | Status: DC | PRN

## 2012-07-03 MED ORDER — ROCURONIUM BROMIDE 100 MG/10ML IV SOLN
INTRAVENOUS | Status: DC | PRN
  Administered 2012-07-03: 70 mg via INTRAVENOUS
  Administered 2012-07-03: 50 mg via INTRAVENOUS
  Administered 2012-07-03: 20 mg via INTRAVENOUS

## 2012-07-03 MED ORDER — METHOCARBAMOL 500 MG PO TABS
ORAL_TABLET | ORAL | Status: AC
  Filled 2012-07-03: qty 1

## 2012-07-03 MED ORDER — HYDROMORPHONE HCL PF 1 MG/ML IJ SOLN
INTRAMUSCULAR | Status: AC
  Filled 2012-07-03: qty 1

## 2012-07-03 MED ORDER — ACETAMINOPHEN 650 MG RE SUPP: 650.0000 mg | Freq: Four times a day (QID) | RECTAL | Status: DC | PRN

## 2012-07-03 SURGICAL SUPPLY — 59 items
BLADE SAW SAG 73X25 THK (BLADE) ×1
BLADE SAW SGTL 73X25 THK (BLADE) ×1 IMPLANT
BRUSH FEMORAL CANAL (MISCELLANEOUS) IMPLANT
CLOTH BEACON ORANGE TIMEOUT ST (SAFETY) ×2 IMPLANT
COVER BACK TABLE 24X17X13 BIG (DRAPES) IMPLANT
COVER SURGICAL LIGHT HANDLE (MISCELLANEOUS) ×2 IMPLANT
DRAPE INCISE IOBAN 66X45 STRL (DRAPES) IMPLANT
DRAPE ORTHO SPLIT 77X108 STRL (DRAPES) ×4
DRAPE SURG ORHT 6 SPLT 77X108 (DRAPES) ×2 IMPLANT
DRAPE U-SHAPE 47X51 STRL (DRAPES) ×2 IMPLANT
DRSG ADAPTIC 3X8 NADH LF (GAUZE/BANDAGES/DRESSINGS) ×2 IMPLANT
DRSG PAD ABDOMINAL 8X10 ST (GAUZE/BANDAGES/DRESSINGS) ×3 IMPLANT
DURAPREP 26ML APPLICATOR (WOUND CARE) ×2 IMPLANT
ELECT CAUTERY BLADE 6.4 (BLADE) ×2 IMPLANT
ELECT REM PT RETURN 9FT ADLT (ELECTROSURGICAL) ×2
ELECTRODE REM PT RTRN 9FT ADLT (ELECTROSURGICAL) ×1 IMPLANT
ELIMINATOR HOLE APEX DEPUY (Hips) IMPLANT
EVACUATOR 1/8 PVC DRAIN (DRAIN) IMPLANT
FACESHIELD LNG OPTICON STERILE (SAFETY) ×4 IMPLANT
GLOVE BIOGEL PI IND STRL 8 (GLOVE) ×4 IMPLANT
GLOVE BIOGEL PI INDICATOR 8 (GLOVE) ×4
GLOVE ORTHO TXT STRL SZ7.5 (GLOVE) ×6 IMPLANT
GLOVE SURG ORTHO 8.0 STRL STRW (GLOVE) ×8 IMPLANT
GOWN PREVENTION PLUS XLARGE (GOWN DISPOSABLE) ×4 IMPLANT
GOWN PREVENTION PLUS XXLARGE (GOWN DISPOSABLE) ×2 IMPLANT
GOWN STRL NON-REIN LRG LVL3 (GOWN DISPOSABLE) ×2 IMPLANT
HANDPIECE INTERPULSE COAX TIP (DISPOSABLE)
IMMOBILIZER KNEE 20 (SOFTGOODS)
IMMOBILIZER KNEE 20 THIGH 36 (SOFTGOODS) IMPLANT
IMMOBILIZER KNEE 22 UNIV (SOFTGOODS) IMPLANT
IMMOBILIZER KNEE 24 THIGH 36 (MISCELLANEOUS) IMPLANT
IMMOBILIZER KNEE 24 UNIV (MISCELLANEOUS)
KIT BASIN OR (CUSTOM PROCEDURE TRAY) ×2 IMPLANT
KIT ROOM TURNOVER OR (KITS) ×2 IMPLANT
MANIFOLD NEPTUNE II (INSTRUMENTS) ×2 IMPLANT
NDL MAYO TROCAR (NEEDLE) ×1 IMPLANT
NEEDLE 22X1 1/2 (OR ONLY) (NEEDLE) ×2 IMPLANT
NEEDLE MAYO TROCAR (NEEDLE) ×2 IMPLANT
NS IRRIG 1000ML POUR BTL (IV SOLUTION) ×2 IMPLANT
PACK TOTAL JOINT (CUSTOM PROCEDURE TRAY) ×2 IMPLANT
PAD ARMBOARD 7.5X6 YLW CONV (MISCELLANEOUS) ×4 IMPLANT
PRESSURIZER FEMORAL UNIV (MISCELLANEOUS) IMPLANT
SET HNDPC FAN SPRY TIP SCT (DISPOSABLE) IMPLANT
SPONGE GAUZE 4X4 12PLY (GAUZE/BANDAGES/DRESSINGS) ×2 IMPLANT
STAPLER VISISTAT 35W (STAPLE) ×2 IMPLANT
SUCTION FRAZIER TIP 10 FR DISP (SUCTIONS) ×2 IMPLANT
SUT ETHIBOND NAB CT1 #1 30IN (SUTURE) ×8 IMPLANT
SUT VIC AB 0 CTX 36 (SUTURE) ×4
SUT VIC AB 0 CTX36XBRD ANTBCTR (SUTURE) IMPLANT
SUT VIC AB 1 CTB1 27 (SUTURE) ×4 IMPLANT
SUT VIC AB 2-0 CT1 27 (SUTURE) ×4
SUT VIC AB 2-0 CT1 TAPERPNT 27 (SUTURE) ×2 IMPLANT
SYR CONTROL 10ML LL (SYRINGE) ×2 IMPLANT
TAPE CLOTH SURG 4X10 WHT LF (GAUZE/BANDAGES/DRESSINGS) ×1 IMPLANT
TOWEL OR 17X24 6PK STRL BLUE (TOWEL DISPOSABLE) ×2 IMPLANT
TOWEL OR 17X26 10 PK STRL BLUE (TOWEL DISPOSABLE) ×2 IMPLANT
TOWER CARTRIDGE SMART MIX (DISPOSABLE) IMPLANT
TRAY FOLEY CATH 14FR (SET/KITS/TRAYS/PACK) ×2 IMPLANT
WATER STERILE IRR 1000ML POUR (IV SOLUTION) ×8 IMPLANT

## 2012-07-03 NOTE — Progress Notes (Signed)
Orthopedic Tech Progress Note Patient Details:  RUSSELL QUINNEY 1951/01/26 [id number]  Patient ID: Berneta Levins, male   DOB: 03-25-1951, 62 y.o.   MRN: [id number] Trapeze bar patient helper;viewed order from rn order list  Nikki Dom 07/03/2012, 2:48 PM

## 2012-07-03 NOTE — H&P (View-Only) (Signed)
TOTAL HIP ADMISSION H&P  Patient is admitted for right total hip arthroplasty.  Subjective:  Chief Complaint: right hip pain  HPI: Timothy Barton, 61 y.o. male, has a history of pain and functional disability in the right hip(s) due to arthritis and patient has failed non-surgical conservative treatments for greater than 12 weeks to include NSAID's and/or analgesics, corticosteriod injections and activity modification.  Onset of symptoms was gradual with gradually worsening course.The patient noted no past surgery on the right hip(s).  Patient currently rates pain in the right hip at 10 out of 10 with activity. Patient has night pain, worsening of pain with activity and weight bearing, pain that interfers with activities of daily living and pain with passive range of motion. Patient has evidence of periarticular osteophytes and joint space narrowing by imaging studies. This condition presents safety issues increasing the risk of falls. There is no current active infection.  There are no active problems to display for this patient.  Past Medical History  Diagnosis Date  . Hypertension   . Sleep apnea   . Arthritis     Past Surgical History  Procedure Date  . Carpal tunnel release 2002    right and left  . Arthrosopy   . Knee arthroscopy 2006    right  . Back surgery 2006     ruptured disc,and repair of leak x3     (Not in a hospital admission) No Known Allergies  History  Substance Use Topics  . Smoking status: Former Smoker -- 1.5 packs/day for 21 years    Quit date: 07/01/1998  . Smokeless tobacco: Former User    Types: Chew    Quit date: 07/01/1982  . Alcohol Use: No    No family history on file.   Review of Systems  Constitutional: Negative.   HENT: Negative.   Eyes: Negative.   Respiratory: Negative.   Cardiovascular: Negative.   Gastrointestinal: Negative.   Genitourinary: Negative.   Musculoskeletal: Positive for back pain and joint pain. Negative for falls.    Skin: Negative.   Neurological: Negative.   Endo/Heme/Allergies: Negative.   Psychiatric/Behavioral: Negative for depression and substance abuse.    Objective:  Physical Exam  Constitutional: He is oriented to person, place, and time. He appears well-developed and well-nourished. No distress.  HENT:  Head: Normocephalic and atraumatic.  Nose: Nose normal.  Eyes: Conjunctivae normal and EOM are normal. Pupils are equal, round, and reactive to light.  Neck: Normal range of motion. Neck supple.  Cardiovascular: Normal rate, regular rhythm, normal heart sounds and intact distal pulses.   No murmur heard. Respiratory: Effort normal and breath sounds normal. No respiratory distress. He has no wheezes.  GI: Soft. Bowel sounds are normal. He exhibits no distension. There is no tenderness.       obese  Musculoskeletal:       Right hip: He exhibits decreased range of motion, decreased strength, tenderness and bony tenderness.       bilat LE NVI, intact DF and PF ankles, no calf tenderness  Neurological: He is alert and oriented to person, place, and time. No cranial nerve deficit.  Skin: Skin is warm and dry. No rash noted. No erythema.  Psychiatric: He has a normal mood and affect. His behavior is normal.    Vital signs in last 24 hours: @VSRANGES@  Labs:  Ht 5'6 wt 324 BMI approx 50  Imaging Review Plain radiographs demonstrate severe degenerative joint disease of the right hip(s). The bone quality   appears to be good for age and reported activity level.  Assessment/Plan:  End stage arthritis, right hip(s)  The patient history, physical examination, clinical judgement of the provider and imaging studies are consistent with end stage degenerative joint disease of the right hip(s) and total hip arthroplasty is deemed medically necessary. The treatment options including medical management, injection therapy, arthroscopy and arthroplasty were discussed at length. The risks and benefits  of total hip arthroplasty were presented and reviewed. The risks due to aseptic loosening, infection, stiffness, dislocation/subluxation,  thromboembolic complications and other imponderables were discussed.  The patient acknowledged the explanation, agreed to proceed with the plan and consent was signed. Patient is being admitted for inpatient treatment for surgery, pain control, PT, OT, prophylactic antibiotics, VTE prophylaxis, progressive ambulation and ADL's and discharge planning.The patient is planning to be discharged home with home health services 

## 2012-07-03 NOTE — Anesthesia Preprocedure Evaluation (Addendum)
Anesthesia Evaluation  Patient identified by MRN, date of birth, ID band Patient awake    Reviewed: Allergy & Precautions, H&P , NPO status , Patient's Chart, lab work & pertinent test results  Airway Mallampati: II  Neck ROM: full    Dental  (+) Dental Advisory Given and Caps Chipped cap in the upper front:   Pulmonary sleep apnea , former smoker,          Cardiovascular hypertension, Pt. on medications     Neuro/Psych    GI/Hepatic   Endo/Other  Morbid obesity  Renal/GU      Musculoskeletal  (+) Arthritis -,   Abdominal   Peds  Hematology   Anesthesia Other Findings   Reproductive/Obstetrics                         Anesthesia Physical Anesthesia Plan  ASA: II  Anesthesia Plan: General   Post-op Pain Management:    Induction: Intravenous  Airway Management Planned: Oral ETT  Additional Equipment:   Intra-op Plan:   Post-operative Plan: Extubation in OR  Informed Consent: I have reviewed the patients History and Physical, chart, labs and discussed the procedure including the risks, benefits and alternatives for the proposed anesthesia with the patient or authorized representative who has indicated his/her understanding and acceptance.   Dental advisory given  Plan Discussed with: CRNA, Surgeon and Anesthesiologist  Anesthesia Plan Comments:        Anesthesia Quick Evaluation

## 2012-07-03 NOTE — Transfer of Care (Signed)
Immediate Anesthesia Transfer of Care Note  Patient: Timothy Barton  Procedure(s) Performed: Procedure(s) (LRB) with comments: TOTAL HIP ARTHROPLASTY (Right) - with autograft  Patient Location: PACU  Anesthesia Type:General  Level of Consciousness: awake and patient cooperative  Airway & Oxygen Therapy: Patient Spontanous Breathing and Patient connected to face mask oxygen  Post-op Assessment: Report given to PACU RN and Patient moving all extremities X 4  Post vital signs: Reviewed and stable  Complications: No apparent anesthesia complications

## 2012-07-03 NOTE — Interval H&P Note (Signed)
History and Physical Interval Note:  07/03/2012 7:33 AM  Timothy Barton  has presented today for surgery, with the diagnosis of OA RIGHT HIP  The various methods of treatment have been discussed with the patient and family. After consideration of risks, benefits and other options for treatment, the patient has consented to  Procedure(s) (LRB) with comments: TOTAL HIP ARTHROPLASTY (Right) - with autograft as a surgical intervention .  The patient's history has been reviewed, patient examined, no change in status, stable for surgery.  I have reviewed the patient's chart and labs.  Questions were answered to the patient's satisfaction.     [name],W D

## 2012-07-03 NOTE — Anesthesia Postprocedure Evaluation (Signed)
Anesthesia Post Note  Patient: Timothy Barton  Procedure(s) Performed: Procedure(s) (LRB): TOTAL HIP ARTHROPLASTY (Right)  Anesthesia type: General  Patient location: PACU  Post pain: Pain level controlled and Adequate analgesia  Post assessment: Post-op Vital signs reviewed, Patient's Cardiovascular Status Stable, Respiratory Function Stable, Patent Airway and Pain level controlled  Last Vitals:  Filed Vitals:   07/03/12 1130  BP:   Pulse:   Temp: 36.2 C  Resp:     Post vital signs: Reviewed and stable  Level of consciousness: awake, alert  and oriented  Complications: No apparent anesthesia complications

## 2012-07-03 NOTE — Preoperative (Signed)
Beta Blockers   Reason not to administer Beta Blockers:Not Applicable 

## 2012-07-03 NOTE — Anesthesia Procedure Notes (Signed)
Procedure Name: Intubation Date/Time: 07/03/2012 7:46 AM Performed by: Sherie Don Pre-anesthesia Checklist: Patient identified, Emergency Drugs available, Suction available, Patient being monitored and Timeout performed Patient Re-evaluated:Patient Re-evaluated prior to inductionOxygen Delivery Method: Circle system utilized Preoxygenation: Pre-oxygenation with 100% oxygen Intubation Type: IV induction and Rapid sequence Laryngoscope Size: Mac and 3 (Glide scope large adult blade) Tube type: Oral Number of attempts: 1 Airway Equipment and Method: Stylet and Video-laryngoscopy Placement Confirmation: ETT inserted through vocal cords under direct vision,  CO2 detector and breath sounds checked- equal and bilateral Secured at: 23 cm Tube secured with: Tape Dental Injury: Bloody posterior oropharynx  Difficulty Due To: Difficulty was anticipated and Difficult Airway- due to large tongue Future Recommendations: Recommend- induction with short-acting agent, and alternative techniques readily available

## 2012-07-03 NOTE — Brief Op Note (Signed)
07/03/2012  10:50 AM  PATIENT:  Berneta Levins  62 y.o. male  PRE-OPERATIVE DIAGNOSIS:  OA RIGHT HIP  POST-OPERATIVE DIAGNOSIS:  OA RIGHT HIP  PROCEDURE:  Procedure(s) (LRB) with comments: TOTAL HIP ARTHROPLASTY (Right) - with autograft  SURGEON:  Surgeon(s) and Role:    * W D Carloyn Manner., MD - Primary  PHYSICIAN ASSISTANT:   ASSISTANTS: Margart Sickles, PA-C   ANESTHESIA:   general  EBL:  Total I/O In: 2950 [I.V.:2700; IV Piggyback:250] Out: 575 [Urine:175; Blood:400]  BLOOD ADMINISTERED:none  DRAINS: none   LOCAL MEDICATIONS USED:  NONE  SPECIMEN:  No Specimen  DISPOSITION OF SPECIMEN:  N/A  COUNTS:  YES  TOURNIQUET:  * No tourniquets in log *  DICTATION: .Other Dictation: Dictation Number   PLAN OF CARE: Admit to inpatient   PATIENT DISPOSITION:  PACU - hemodynamically stable.   Delay start of Pharmacological VTE agent (>24hrs) due to surgical blood loss or risk of bleeding: yes

## 2012-07-04 LAB — CBC
HCT: 29.2 % — ABNORMAL LOW (ref 39.0–52.0)
Hemoglobin: 10.1 g/dL — ABNORMAL LOW (ref 13.0–17.0)
MCH: 31.4 pg (ref 26.0–34.0)
MCHC: 34.6 g/dL (ref 30.0–36.0)
MCV: 90.7 fL (ref 78.0–100.0)

## 2012-07-04 LAB — BASIC METABOLIC PANEL
BUN: 26 mg/dL — ABNORMAL HIGH (ref 6–23)
Creatinine, Ser: 1.88 mg/dL — ABNORMAL HIGH (ref 0.50–1.35)
GFR calc non Af Amer: 37 mL/min — ABNORMAL LOW (ref >90)
Glucose, Bld: 119 mg/dL — ABNORMAL HIGH (ref 70–99)
Potassium: 4.4 mEq/L (ref 3.5–5.1)

## 2012-07-04 NOTE — Progress Notes (Signed)
Patient ID: Timothy Barton, male   DOB: 13-Jan-1951, 62 y.o.   MRN: [id number]     Subjective:  Patient reports pain as mild to moderate.  Patient having a feeling of light headedness at times but not at time of visit.  Objective:   VITALS:   Filed Vitals:   07/03/12 2229 07/04/12 0000 07/04/12 0400 07/04/12 0541  BP: 144/64   95/61  Pulse: 71   80  Temp: 98.6 F (37 C)   99.6 F (37.6 C)  TempSrc: Oral     Resp: 18 18 19 18   Height:      Weight:      SpO2: 96% 96% 97% 96%    ABD soft Sensation intact distally Dorsiflexion/Plantar flexion intact Incision: dressing C/D/I and no drainage  LABS  Results for orders placed during the hospital encounter of 07/03/12 (from the past 24 hour(s))  CBC     Status: Abnormal   Collection Time   07/03/12  3:22 PM      Component Value Range   WBC 20.5 (*) 4.0 - 10.5 K/uL   RBC 4.05 (*) 4.22 - 5.81 MIL/uL   Hemoglobin 12.4 (*) 13.0 - 17.0 g/dL   HCT 62.1 (*) 30.8 - 65.7 %   MCV 90.4  78.0 - 100.0 fL   MCH 30.6  26.0 - 34.0 pg   MCHC 33.9  30.0 - 36.0 g/dL   RDW 84.6  96.2 - 95.2 %   Platelets 177  150 - 400 K/uL  CREATININE, SERUM     Status: Normal   Collection Time   07/03/12  3:22 PM      Component Value Range   Creatinine, Ser 0.90  0.50 - 1.35 mg/dL   GFR calc non Af Amer >90  >90 mL/min   GFR calc Af Amer >90  >90 mL/min  CBC     Status: Abnormal   Collection Time   07/04/12  7:10 AM      Component Value Range   WBC 10.5  4.0 - 10.5 K/uL   RBC 3.22 (*) 4.22 - 5.81 MIL/uL   Hemoglobin 10.1 (*) 13.0 - 17.0 g/dL   HCT 84.1 (*) 32.4 - 40.1 %   MCV 90.7  78.0 - 100.0 fL   MCH 31.4  26.0 - 34.0 pg   MCHC 34.6  30.0 - 36.0 g/dL   RDW 02.7  25.3 - 66.4 %   Platelets 144 (*) 150 - 400 K/uL  BASIC METABOLIC PANEL     Status: Abnormal   Collection Time   07/04/12  7:10 AM      Component Value Range   Sodium 134 (*) 135 - 145 mEq/L   Potassium 4.4  3.5 - 5.1 mEq/L   Chloride 96  96 - 112 mEq/L   CO2 28  19 - 32 mEq/L   Glucose, Bld 119 (*) 70 - 99 mg/dL   BUN 26 (*) 6 - 23 mg/dL   Creatinine, Ser 4.03 (*) 0.50 - 1.35 mg/dL   Calcium 8.4  8.4 - 47.4 mg/dL   GFR calc non Af Amer 37 (*) >90 mL/min   GFR calc Af Amer 43 (*) >90 mL/min    Dg Pelvis Portable  07/03/2012  *RADIOLOGY REPORT*  Clinical Data: Postop  PORTABLE PELVIS  Comparison: 11/22/2010  Findings: Right total hip arthroplasty has been performed. Anatomic alignment of the osseous and prosthetic structures.  No breakage or loosening of the hardware. No acute fracture.  IMPRESSION: Right  total hip arthroplasty anatomically aligned.   Original Report Authenticated By: Jolaine Click, M.D.    Dg Hip Portable 1 View Right  07/03/2012  *RADIOLOGY REPORT*  Clinical Data: Postop  PORTABLE RIGHT HIP - 1 VIEW  Comparison: 11/22/2010  Findings: Right total hip arthroplasty has been performed. Anatomic alignment of the osseous and prosthetic structures.  No breakage or loosening of the hardware.  IMPRESSION: Total hip arthroplasty anatomically aligned.   Original Report Authenticated By: Jolaine Click, M.D.     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 Recheck creatinine tomorrow.  Advance diet Up with therapy Plan for discharge tomorrow Will continue to monitor dizziness his BP was 95/61 at time of visit but was not symptomatic       [name], [name] 07/04/2012, 10:22 AM   Teryl Lucy, MD Cell [phone] Pager [phone]

## 2012-07-04 NOTE — Progress Notes (Signed)
Physical Therapy Treatment Patient Details Name: NEFTALI ABAIR MRN: [id number] DOB: 1950-07-04 Today's Date: 07/04/2012 Time: 4098-1191 PT Time Calculation (min): 16 min  PT Assessment / Plan / Recommendation Comments on Treatment Session  Exchanged pt's in room recliner for a wide recliner.  Recliner seat elevated using blanket rolls to assist with sit-->stand.    Follow Up Recommendations  Home health PT;Supervision for mobility/OOB     Does the patient have the potential to tolerate intense rehabilitation     Barriers to Discharge        Equipment Recommendations  Other (comment) (lift chair)    Recommendations for Other Services    Frequency 7X/week   Plan Discharge plan remains appropriate;Frequency remains appropriate    Precautions / Restrictions Precautions Precautions: Posterior Hip Precaution Comments: Reviewed 3/3 hip precautions. Restrictions RLE Weight Bearing: Weight bearing as tolerated   Pertinent Vitals/Pain 3/10    Mobility  Bed Mobility Bed Mobility: Supine to Sit Supine to Sit: 1: +2 Total assist;With rails;HOB elevated Supine to Sit: Patient Percentage: 60% Details for Bed Mobility Assistance: verbal cues for sequencing, precautions Transfers Sit to Stand: 1: +2 Total assist;From bed;With upper extremity assist Sit to Stand: Patient Percentage: 60% Stand to Sit: 4: Min assist;To chair/3-in-1;With upper extremity assist Details for Transfer Assistance: verbal cues for sequencing Ambulation/Gait Ambulation/Gait Assistance: 4: Min guard Ambulation Distance (Feet): 35 Feet Assistive device: Rolling walker Ambulation/Gait Assistance Details: verbal cues for safety, RW management.  Pt wanting to rest with forearms on RW. Gait Pattern: Step-to pattern;Antalgic Gait velocity: decreased    Exercises     PT Diagnosis:    PT Problem List:   PT Treatment Interventions:     PT Goals Acute Rehab PT Goals PT Goal: Supine/Side to Sit - Progress:  Goal set today PT Goal: Sit to Supine/Side - Progress: Goal set today PT Goal: Sit to Stand - Progress: Goal set today PT Goal: Stand to Sit - Progress: Goal set today PT Goal: Ambulate - Progress: Goal set today PT Goal: Up/Down Stairs - Progress: Goal set today Additional Goals Additional Goal #1: Pt to verbalize and adhere to 3/3 hip precautions. PT Goal: Additional Goal #1 - Progress: Goal set today  Visit Information  Last PT Received On: 07/04/12 Assistance Needed: +2    Subjective Data  Subjective: "My hips are more sore this afternoon." Patient Stated Goal: home   Cognition  Overall Cognitive Status: Appears within functional limits for tasks assessed/performed Arousal/Alertness: Awake/alert Orientation Level: Appears intact for tasks assessed Behavior During Session: [facility] for tasks performed    Balance     End of Session PT - End of Session Equipment Utilized During Treatment: Gait belt Activity Tolerance: Patient tolerated treatment well Patient left: in chair;with call bell/phone within reach Nurse Communication: Mobility status   GP     Ilda Foil 07/04/2012, 2:59 PM  Aida Raider, PT  Office # [phone] Pager [phone]

## 2012-07-04 NOTE — Progress Notes (Signed)
RT Note: Pt placed on Auto CPAP with nasal mask and 2L 02 bleed in. Pt tolerating well, RT and RN will continue to monitor.

## 2012-07-04 NOTE — Op Note (Signed)
NAME:  Timothy Barton, Timothy Barton NO.:  [id number]  MEDICAL RECORD NO.:  [id number]  LOCATION:  [id number]                        FACILITY:  [facility]  PHYSICIAN:  Dyke Brackett, M.D.    DATE OF BIRTH:  July 01, 1950  DATE OF PROCEDURE: DATE OF DISCHARGE:                              OPERATIVE REPORT   INDICATIONS:  A 62 year old with morbid obesity with BMI of 50 with end- stage hip not responding to conservative treatment, thought to be amenable to hospitalization.  PREOPERATIVE DIAGNOSIS: 1. Severe osteoarthritis, right hip. 2. Morbid obesity.  POSTOPERATIVE DIAGNOSIS: 1. Severe osteoarthritis, right hip. 2. Morbid obesity.  OPERATION:  Right total hip replacement (AML porous coated 15 femur, 1.5 mm neck length with 36-mm ceramic __________ ball, and 54-mm Gription sector cup with a cross-link polyethylene).  SURGEON:  Dyke Brackett, M.D.  ASSISTANTVincent Peyer.  BLOOD LOSS:  Approximately 600.  DESCRIPTION OF PROCEDURE:  Sterile prep and drape, a lateral position posterior approach to the hip made.  The iliotibial band was split. Gluteus maximus fascia split.  We split the external rotators and did a T-capsulotomy of the hip.  Hip had a very little motion maintaining the exposure somewhat difficult.  We identified all landmarks carefully, protected the sciatic nerve.  We did dislocate the hip and then cut the head at about 1 fingerbreadth above the lesser trochanter progressively reaming up to a 0.5 mm under reaming with 0.5 mm reamer and then rasping the calcar plane.  The revision cut on the neck due to some excess neck length.  Attention was next directed to the acetabulum.  Acetabular retractors placed anteriorly and inferiorly with a wing superior, posterior.  Rim was exposed.  We had excised a fair amount of redundant capsule and labrum due to the patient's large size.  We then reamed basically in about 45-50 degrees abduction, 15-20 degrees anteversion and  reamed up to a 53-mm diameter except the 54-mm cup.  Elected to use a Gription cup with sectors, although this fit was snugged up.  I did not need to use the holes on the sector cup.  We did do an apex screw, however.  We placed a trial poly and then trialed off the rasp.  __________ left probably half a fingerbreadth __________ despite the vigorous reaming and rasping __________ even though we had a good snug fit.  I then placed the final prosthesis.  Final prosthesis was placed.  Then trialed off that.  We had the option for a 1.5 mm ceramic versus metal and based on the patient's young age and the potential stability increasing to 1.5, we elected to use +1.5 neck length ceramic head.  Prior to this, we replaced the trial poly with the final poly and all parameters deemed to be acceptable specifically flexion to 90 degrees, full adduction of the side and internal rotation of 45 degrees before the hip would dislocate.  Wound was copiously irrigated.  There was not really a lot of capsule we could close.  We closed the fascia with 2 running Ethibond, the subcutaneous fatty layer with 0, 2-0 and the skin clips on the skin.  Marcaine with epinephrine on the skin.  Lightly compressive sterile dressing and knee immobilizer applied.  Taken to recovery room in stable condition.     Dyke Brackett, M.D.     WDC/MEDQ  D:  07/03/2012  T:  07/04/2012  Job:  [id number]

## 2012-07-04 NOTE — Progress Notes (Signed)
RT Note: Pt has his own CPAP machine and is using it at this time.

## 2012-07-04 NOTE — Evaluation (Signed)
Physical Therapy Evaluation Patient Details Name: Timothy Barton MRN: [id number] DOB: Jul 13, 1950 Today's Date: 07/04/2012 Time: 1308-6578 PT Time Calculation (min): 25 min  PT Assessment / Plan / Recommendation Clinical Impression  Pt underwent R THA 07-03-12.  PT indicated to maximize functional mobility to ensure safe d/c home with family.    PT Assessment  Patient needs continued PT services    Follow Up Recommendations  Home health PT;Supervision for mobility/OOB    Does the patient have the potential to tolerate intense rehabilitation      Barriers to Discharge None      Equipment Recommendations  Other (comment) (lift chair)    Recommendations for Other Services     Frequency 7X/week    Precautions / Restrictions Precautions Precautions: Posterior Hip Precaution Comments: Educated pt on 3/3 hip precautions. Restrictions Weight Bearing Restrictions: Yes RLE Weight Bearing: Weight bearing as tolerated   Pertinent Vitals/Pain 4/10      Mobility  Transfers Transfers: Sit to Stand;Stand to Sit Sit to Stand: 1: +2 Total assist;From bed;With upper extremity assist Sit to Stand: Patient Percentage: 50% Stand to Sit: 4: Min assist;With armrests;To chair/3-in-1 Details for Transfer Assistance: sit-->stand from bedside recliner with Min assist.  Verbal cues for hand placement, sequencing Ambulation/Gait Ambulation/Gait Assistance: 4: Min guard Ambulation Distance (Feet): 45 Feet Assistive device: Rolling walker Ambulation/Gait Assistance Details: verbal cues for RW management Gait Pattern: Step-to pattern;Antalgic Gait velocity: decreased    Shoulder Instructions     Exercises     PT Diagnosis: Difficulty walking;Acute pain  PT Problem List: Decreased activity tolerance;Decreased mobility;Obesity;Pain;Decreased knowledge of precautions PT Treatment Interventions: DME instruction;Gait training;Stair training;Functional mobility training;Therapeutic  activities;Therapeutic exercise;Patient/family education   PT Goals Acute Rehab PT Goals PT Goal Formulation: With patient Time For Goal Achievement: 07/11/12 Potential to Achieve Goals: Good Pt will go Supine/Side to Sit: with min assist;with HOB 0 degrees PT Goal: Supine/Side to Sit - Progress: Goal set today Pt will go Sit to Supine/Side: with min assist;with HOB 0 degrees PT Goal: Sit to Supine/Side - Progress: Goal set today Pt will go Sit to Stand: from elevated surface;with supervision;with upper extremity assist PT Goal: Sit to Stand - Progress: Goal set today Pt will go Stand to Sit: with supervision;with upper extremity assist PT Goal: Stand to Sit - Progress: Goal set today Pt will Ambulate: 51 - 150 feet;with supervision;with rolling walker PT Goal: Ambulate - Progress: Goal set today Pt will Go Up / Down Stairs: 3-5 stairs;with min assist;with rail(s) PT Goal: Up/Down Stairs - Progress: Goal set today  Visit Information  Last PT Received On: 07/04/12 Assistance Needed: +2    Subjective Data  Subjective: "I need to get out of this bed." Patient Stated Goal: home   Prior Functioning  Home Living Lives With: Spouse;Family Available Help at Discharge: Family Type of Home: Mobile home Home Access: Stairs to enter Entrance Stairs-Number of Steps: 5 Entrance Stairs-Rails: Right;Left;Can reach both Home Layout: One level Home Adaptive Equipment: Walker - rolling Prior Function Level of Independence: Independent Able to Take Stairs?: Yes Driving: Yes Vocation: Retired Comments: worked until 05/2012 Communication Communication: No difficulties    Cognition  Overall Cognitive Status: Appears within functional limits for tasks assessed/performed Arousal/Alertness: Awake/alert Orientation Level: Appears intact for tasks assessed Behavior During Session: [facility] for tasks performed    Extremity/Trunk Assessment     Balance    End of Session PT - End of  Session Equipment Utilized During Treatment: Gait belt Activity Tolerance: Patient tolerated  treatment well Patient left: in chair;with call bell/phone within reach;with family/visitor present  GP     Ilda Foil 07/04/2012, 10:55 AM  Aida Raider, PT  Office # [phone] Pager [phone]

## 2012-07-04 NOTE — Progress Notes (Signed)
Occupational Therapy Evaluation Patient Details Name: RANEY KOEPPEN MRN: [id number] DOB: March 06, 1951 Today's Date: 07/04/2012 Time: 1830-1920 OT Time Calculation (min): 50 min  OT Assessment / Plan / Recommendation Clinical Impression  62 yo obese pt s/p R THA. Pt will benefit from skilled OT services to max independence with ADL and functional mobility for ADL to facilitate D/C to next venue of care. Pt is not ready to D/C home at this time. Pt requires Total A +2 at this time with transfers from standard height surfaces as he will have at home. If pt does not improve with mobility tomorrow, recommend that pt  D/C to SNF for ST rehab before returning home. Discussed with pt and wife who are in agreement if needed.    OT Assessment  Patient needs continued OT Services    Follow Up Recommendations  SNF;Home health OT (HH vs SNF)    Barriers to Discharge Other (comment) (6 STE) wife works  Equipment Recommendations  3 in 1 bedside comode;Other (comment) (wide BSC . pt >300#)    Recommendations for Other Services  nutrition  Frequency  Min 3X/week    Precautions / Restrictions Precautions Precautions: Posterior Hip Precaution Booklet Issued: Yes (comment) Precaution Comments: Reviewed 3/3 hip precautions. (pt able to recall) Restrictions RLE Weight Bearing: Weight bearing as tolerated   Pertinent Vitals/Pain 5. nsg notified. Repositioned. Ice    ADL  Eating/Feeding: Independent Where Assessed - Eating/Feeding: Edge of bed Grooming: Set up Where Assessed - Grooming: Supported sitting Upper Body Bathing: Set up Where Assessed - Upper Body Bathing: Supported sitting Lower Body Bathing: +1 Total assistance Where Assessed - Lower Body Bathing: Supported sit to stand Upper Body Dressing: Set up Where Assessed - Upper Body Dressing: Supported sitting Lower Body Dressing: +1 Total assistance Where Assessed - Lower Body Dressing: Supported sit to Pharmacist, hospital:  Chief of Staff: Patient Percentage: 50% Acupuncturist: Materials engineer and Hygiene: +1 Total assistance Where Assessed - Engineer, mining and Hygiene: Sit to stand from 3-in-1 or toilet Equipment Used: Rolling walker;Gait belt;Reacher;Long-handled sponge;Sock aid Transfers/Ambulation Related to ADLs: Pt unable to transfer from sit - stand with +1 total a this am ADL Comments: No knowledge of hip precautions/AE for ADL. Discussed concerns regarding D/C home. Pt unable to transfer from sit - stand this pm    OT Diagnosis: Generalized weakness;Acute pain  OT Problem List: Decreased strength;Decreased range of motion;Decreased activity tolerance;Decreased knowledge of use of DME or AE;Decreased knowledge of precautions;Cardiopulmonary status limiting activity;Obesity;Pain OT Treatment Interventions: Self-care/ADL training;DME and/or AE instruction;Therapeutic activities;Patient/family education;Therapeutic exercise;Energy conservation   OT Goals Acute Rehab OT Goals OT Goal Formulation: With patient Time For Goal Achievement: 07/18/12 Potential to Achieve Goals: Good ADL Goals Pt Will Perform Lower Body Dressing: with min assist;with caregiver independent in assisting;Sit to stand from chair;with adaptive equipment;Unsupported ADL Goal: Lower Body Dressing - Progress: Goal set today Pt Will Transfer to Toilet: with min assist;Ambulation;3-in-1;with DME;Extra wide 3-in-1;Maintaining hip precautions ADL Goal: Toilet Transfer - Progress: Goal set today Pt Will Perform Toileting - Clothing Manipulation: with modified independence;with adaptive equipment;Sitting on 3-in-1 or toilet;Standing ADL Goal: Toileting - Clothing Manipulation - Progress: Goal set today Pt Will Perform Toileting - Hygiene: with modified independence;Standing at 3-in-1/toilet;Sit to stand from 3-in-1/toilet;with adaptive equipment ADL  Goal: Toileting - Hygiene - Progress: Goal set today Additional ADL Goal #1: Complete bed mobility with mod A in preparation for ADL ADL Goal: Additional Goal #1 -  Progress: Goal set today Additional ADL Goal #2: Demonstrate hip precautions during mobility for ADL with S. ADL Goal: Additional Goal #2 - Progress: Goal set today  Visit Information  Last OT Received On: 07/04/12 Assistance Needed: +2    Subjective Data  Subjective: I can't do it   Prior Functioning     Home Living Lives With: Spouse;Family Available Help at Discharge: Family Type of Home: Mobile home Home Access: Stairs to enter Entrance Stairs-Number of Steps: 5 Entrance Stairs-Rails: Right;Left;Can reach both Home Layout: One level Bathroom Shower/Tub: Walk-in shower;Door [employer] Toilet: Standard Bathroom Accessibility: Yes How Accessible: Accessible via walker Home Adaptive Equipment: Walker - rolling;Bedside commode/3-in-1;Shower chair with back (std BSC) Prior Function Level of Independence: Independent Able to Take Stairs?: Yes Driving: Yes Vocation: Retired Musician: No difficulties Dominant Hand: Right         Vision/Perception  WFL   Cognition  Overall Cognitive Status: Appears within functional limits for tasks assessed/performed Arousal/Alertness: Awake/alert Orientation Level: Appears intact for tasks assessed Behavior During Session: [facility] for tasks performed    Extremity/Trunk Assessment Right Upper Extremity Assessment RUE ROM/Strength/Tone: WFL for tasks assessed RUE Sensation: WFL - Light Touch;WFL - Proprioception RUE Coordination: WFL - gross/fine motor Left Upper Extremity Assessment LUE ROM/Strength/Tone: WFL for tasks assessed LUE Sensation: WFL - Light Touch;WFL - Proprioception LUE Coordination: WFL - gross/fine motor Right Lower Extremity Assessment RLE ROM/Strength/Tone: Deficits;Due to pain;Due to precautions RLE ROM/Strength/Tone Deficits:  THA Left Lower Extremity Assessment LLE ROM/Strength/Tone: Deficits LLE ROM/Strength/Tone Deficits: c/o L hip pain Trunk Assessment Trunk Assessment: Normal     Mobility Bed Mobility Bed Mobility: Supine to Sit;Sitting - Scoot to Edge of Bed;Sit to Supine Supine to Sit: 1: +2 Total assist;HOB flat Supine to Sit: Patient Percentage: 60% Sitting - Scoot to Edge of Bed: 3: Mod assist Sit to Supine: 1: +2 Total assist;HOB flat Sit to Supine: Patient Percentage: 60% Details for Bed Mobility Assistance: max vc and manual facilitation. Pt unable to scoot hips back onto bed. Required +3 A to move pt back onto bed due to body habitus. Transfers Transfers: Sit to Stand;Stand to Sit Sit to Stand: 1: +2 Total assist;With upper extremity assist;From bed Sit to Stand: Patient Percentage: 50% Stand to Sit: 1: +2 Total assist;To bed Stand to Sit: Patient Percentage: 50% Details for Transfer Assistance: Pt with decreased performance this pm due to fatigue. Pt c/o L hip pain. ? due to taking pressure off R LE.     Shoulder Instructions     Exercise     Balance  poor dynamic sitting balance. Unable to lift butt back onto bed. Required +3 A   End of Session OT - End of Session Equipment Utilized During Treatment: Gait belt Activity Tolerance: Other (comment);Patient limited by fatigue Patient left: in bed;with call bell/phone within reach;with family/visitor present Nurse Communication: Mobility status;Other (comment) (concerns over d/C plans)  GO     Komal Stangelo,HILLARY 07/04/2012, 8:27 PM [facility], OTR/L  [phone] 07/04/2012

## 2012-07-05 ENCOUNTER — Encounter (HOSPITAL_COMMUNITY): Payer: Self-pay | Admitting: Orthopedic Surgery

## 2012-07-05 DIAGNOSIS — M1611 Unilateral primary osteoarthritis, right hip: Secondary | ICD-10-CM

## 2012-07-05 HISTORY — DX: Unilateral primary osteoarthritis, right hip: M16.11

## 2012-07-05 LAB — CBC
Hemoglobin: 9.5 g/dL — ABNORMAL LOW (ref 13.0–17.0)
MCH: 30.7 pg (ref 26.0–34.0)
MCHC: 33.9 g/dL (ref 30.0–36.0)
RDW: 14.3 % (ref 11.5–15.5)

## 2012-07-05 NOTE — Progress Notes (Signed)
Patient ID: Timothy Barton, male   DOB: 1950-12-25, 62 y.o.   MRN: [id number]     Subjective:  Patient reports pain as mild to moderate.  Patient having problem progressing with PT talked with family and all would like to proceed to SNF  Objective:   VITALS:   Filed Vitals:   07/04/12 2040 07/05/12 0342 07/05/12 0610 07/05/12 0800  BP: 105/55  95/50   Pulse: 103  92   Temp: 101.1 F (38.4 C) 98.4 F (36.9 C) 99.3 F (37.4 C)   TempSrc: Oral Oral Oral   Resp: 18  16 16   Height:      Weight:      SpO2: 98%  94% 94%    ABD soft Sensation intact distally Dorsiflexion/Plantar flexion intact Incision: dressing C/D/I and no drainage   CBC    Component Value Date/Time   WBC 9.6 07/05/2012 0515   RBC 3.09* 07/05/2012 0515   HGB 9.5* 07/05/2012 0515   HCT 28.0* 07/05/2012 0515   PLT 109* 07/05/2012 0515   MCV 90.6 07/05/2012 0515   MCH 30.7 07/05/2012 0515   MCHC 33.9 07/05/2012 0515   RDW 14.3 07/05/2012 0515   LYMPHSABS 2.3 07/01/2012 1541   MONOABS 0.9 07/01/2012 1541   EOSABS 0.1 07/01/2012 1541   BASOSABS 0.0 07/01/2012 1541      Assessment/Plan: 2 Days Post-Op  Right Total Hip Arthroplasty  Principal Problem:  *Osteoarthritis of right hip   Advance diet Up with therapy Plan for discharge tomorrow ABLA continue to monitor Patient and family wanting to go to SNF Dry dressing change PRN WBAT   Haskel Khan 07/05/2012, 11:42 AM   Teryl Lucy, MD Cell [phone] Pager [phone]

## 2012-07-05 NOTE — Progress Notes (Signed)
Physical Therapy Treatment Patient Details Name: Timothy Barton MRN: [id number] DOB: 1951/03/30 Today's Date: 07/05/2012 Time: 5638-7564 PT Time Calculation (min): 49 min  PT Assessment / Plan / Recommendation Comments on Treatment Session  Pt/family goal is for pt to return home with HHPT.  Pt's wife is working on getting a Surveyor, mining for pt to use at home for daytime and sleeping at night.    Follow Up Recommendations  Home health PT;Supervision for mobility/OOB     Does the patient have the potential to tolerate intense rehabilitation     Barriers to Discharge        Equipment Recommendations  Rolling walker with 5" wheels (bariatric)    Recommendations for Other Services    Frequency 7X/week   Plan Discharge plan remains appropriate;Frequency remains appropriate    Precautions / Restrictions Precautions Precautions: Posterior Hip Precaution Comments: Reviewed 3/3 hip precautions Restrictions RLE Weight Bearing: Weight bearing as tolerated   Pertinent Vitals/Pain     Mobility  Transfers Sit to Stand: 4: Min assist;From chair/3-in-1;From elevated surface;With armrests Stand to Sit: 4: Min assist;To elevated surface;To chair/3-in-1;With armrests Details for Transfer Assistance: verbal cues for sequencing, hand placement Ambulation/Gait Ambulation/Gait Assistance: 4: Min guard Ambulation Distance (Feet): 90 Feet Assistive device: Rolling walker Ambulation/Gait Assistance Details: verbal cues for safety, RW management Gait Pattern: Antalgic;Step-to pattern Gait velocity: decreased Stairs: Yes Stairs Assistance: 4: Min assist Stairs Assistance Details (indicate cue type and reason): verbal cues for sequencing Stair Management Technique: Two rails;Forwards Number of Stairs: 3     Exercises     PT Diagnosis:    PT Problem List:   PT Treatment Interventions:     PT Goals Acute Rehab PT Goals PT Goal: Supine/Side to Sit - Progress: Progressing toward  goal PT Goal: Sit to Supine/Side - Progress: Progressing toward goal PT Goal: Sit to Stand - Progress: Progressing toward goal PT Goal: Stand to Sit - Progress: Progressing toward goal PT Goal: Ambulate - Progress: Progressing toward goal PT Goal: Up/Down Stairs - Progress: Met Additional Goals PT Goal: Additional Goal #1 - Progress: Progressing toward goal  Visit Information  Last PT Received On: 07/05/12 Assistance Needed: +1 PT/OT Co-Evaluation/Treatment: Yes    Subjective Data  Subjective: "I am just agitated today." Patient Stated Goal: home   Cognition  Overall Cognitive Status: Appears within functional limits for tasks assessed/performed Arousal/Alertness: Awake/alert Orientation Level: Appears intact for tasks assessed Behavior During Session: [facility] for tasks performed    Balance     End of Session PT - End of Session Equipment Utilized During Treatment: Gait belt Activity Tolerance: Patient tolerated treatment well Patient left: with family/visitor present;Other (comment) (in bathroom, NT aware) Nurse Communication: Mobility status   GP     Ilda Foil 07/05/2012, 4:43 PM  Aida Raider, PT  Office # [phone] Pager [phone]

## 2012-07-05 NOTE — Progress Notes (Signed)
Pt is having c/o constipation, last BM 07/02/12.  PRN laxatives administered night prior.  Spouse brought prune juice from home and gave to patient.  Nurse informed pt that if no BM after pt, other measures will be taken.  Water intake encouraged, sodas at bedside.  Will continue to monitor for status changes.

## 2012-07-05 NOTE — Progress Notes (Signed)
Occupational Therapy Treatment Patient Details Name: Timothy Barton MRN: [id number] DOB: 02/02/1951 Today's Date: 07/05/2012 Time: 1600-1630 OT Time Calculation (min): 30 min  OT Assessment / Plan / Recommendation Comments on Treatment Session Pt with improved performance today. Discussed situation with SW who stated that pt would be responsible for large portion of rehab financially. Pt unable to afford rehab, therefore, PT/OT co treated to resolve D/C concerns. Pt will need to sleep in stable recliner and have family adjust height of bed/chair to adhere to post hip precautions. Pt will need HHOT/PT after D/C. Pt will be ready for D/C home Tuesday. Pt needs wide walker and wide BSC due to wt @335 #.     Follow Up Recommendations  Home health OT    Barriers to Discharge   none    Equipment Recommendations  3 in 1 bedside comode;Other (comment) (wide)    Recommendations for Other Services  none  Frequency Min 3X/week   Plan Discharge plan needs to be updated    Precautions / Restrictions Precautions Precautions: Posterior Hip Precaution Booklet Issued: Yes (comment) Precaution Comments: Reviewed 3/3 hip precautions Restrictions RLE Weight Bearing: Weight bearing as tolerated   Pertinent Vitals/Pain 4    ADL  Toilet Transfer: Min Pension scheme manager Method: Sit to Barista: Comfort height toilet;Other (comment);Grab bars Toileting - Clothing Manipulation and Hygiene: Moderate assistance Where Assessed - Toileting Clothing Manipulation and Hygiene: Sit to stand from 3-in-1 or toilet;Standing (given toilet aid) Equipment Used: Gait belt;Reacher;Long-handled sponge;Rolling walker;Sock aid Transfers/Ambulation Related to ADLs: min A from chair. REquires vc for proper positioning/adherance to hip precautions during mobility ADL Comments: Pt given AE due to financial constraints as pt will be home alone when wife returns to work. AE dramatically increases  independence with mobility Requires mod vc to follow precautions during mobility   OT Diagnosis:    OT Problem List:   OT Treatment Interventions:     OT Goals Acute Rehab OT Goals OT Goal Formulation: With patient Time For Goal Achievement: 07/18/12 Potential to Achieve Goals: Good ADL Goals Pt Will Perform Lower Body Dressing: with min assist;with caregiver independent in assisting;Sit to stand from chair;with adaptive equipment;Unsupported ADL Goal: Lower Body Dressing - Progress: Progressing toward goals Pt Will Transfer to Toilet: with min assist;Ambulation;3-in-1;with DME;Extra wide 3-in-1;Maintaining hip precautions ADL Goal: Toilet Transfer - Progress: Progressing toward goals Pt Will Perform Toileting - Clothing Manipulation: with modified independence;with adaptive equipment;Sitting on 3-in-1 or toilet;Standing ADL Goal: Toileting - Clothing Manipulation - Progress: Progressing toward goals Pt Will Perform Toileting - Hygiene: with modified independence;Standing at 3-in-1/toilet;Sit to stand from 3-in-1/toilet;with adaptive equipment ADL Goal: Toileting - Hygiene - Progress: Progressing toward goals Additional ADL Goal #1: Complete bed mobility with mod A in preparation for ADL Additional ADL Goal #2: Demonstrate hip precautions during mobility for ADL with S. ADL Goal: Additional Goal #2 - Progress: Progressing toward goals  Visit Information  Last OT Received On: 07/05/12 Assistance Needed: +1    Subjective Data      Prior Functioning       Cognition  Overall Cognitive Status: Appears within functional limits for tasks assessed/performed Arousal/Alertness: Awake/alert Orientation Level: Appears intact for tasks assessed Behavior During Session: [facility] for tasks performed    Mobility  Shoulder Instructions Bed Mobility Bed Mobility: Not assessed Transfers Sit to Stand: 4: Min assist;From chair/3-in-1;From elevated surface;With armrests Stand to Sit: 4: Min  assist;To elevated surface;To chair/3-in-1;With armrests Details for Transfer Assistance: verbal cues for sequencing, hand  placement       Exercises      Balance  WFL for ADL   End of Session OT - End of Session Equipment Utilized During Treatment: Gait belt Activity Tolerance: Patient tolerated treatment well Patient left: Other (comment) (in bathroom) Nurse Communication: Mobility status;Precautions  GO     Teresa Nicodemus,HILLARY 07/05/2012, 5:17 PM [facility], OTR/L  [phone] 07/05/2012

## 2012-07-06 ENCOUNTER — Encounter (HOSPITAL_COMMUNITY): Payer: Self-pay | Admitting: Orthopedic Surgery

## 2012-07-06 LAB — BASIC METABOLIC PANEL
Calcium: 8.9 mg/dL (ref 8.4–10.5)
Chloride: 95 mEq/L — ABNORMAL LOW (ref 96–112)
Creatinine, Ser: 1.1 mg/dL (ref 0.50–1.35)
GFR calc Af Amer: 82 mL/min — ABNORMAL LOW (ref >90)

## 2012-07-06 LAB — CBC
MCH: 31.2 pg (ref 26.0–34.0)
MCV: 91 fL (ref 78.0–100.0)
Platelets: 81 10*3/uL — ABNORMAL LOW (ref 150–400)
RDW: 14.1 % (ref 11.5–15.5)
WBC: 6.7 10*3/uL (ref 4.0–10.5)

## 2012-07-06 MED ORDER — ENOXAPARIN SODIUM 40 MG/0.4ML ~~LOC~~ SOLN
40.0000 mg | Freq: Two times a day (BID) | SUBCUTANEOUS | Status: DC
  Administered 2012-07-06 – 2012-07-07 (×2): 40 mg via SUBCUTANEOUS
  Filled 2012-07-06 (×4): qty 0.4

## 2012-07-06 NOTE — Progress Notes (Signed)
Occupational Therapy Treatment Patient Details Name: Timothy Barton MRN: [id number] DOB: [birth year] Today's Date: 07/06/2012 Time:  -     OT Assessment / Plan / Recommendation Comments on Treatment Session pt making progress and should comtinue with OT services to maximize safety and level of function to return home safely    Follow Up Recommendations  Home health OT    Barriers to Discharge       Equipment Recommendations  3 in 1 bedside comode;Tub/shower seat;Other (comment) (ADL A/E kit)    Recommendations for Other Services    Frequency Min 2X/week   Plan Discharge plan remains appropriate    Precautions / Restrictions Precautions Precautions: Posterior Hip Precaution Comments: pt able to recall 3/3 hip precautions Restrictions Weight Bearing Restrictions: Yes RLE Weight Bearing: Weight bearing as tolerated   Pertinent Vitals/Pain     ADL  Lower Body Bathing: Moderate assistance;Other (comment);Simulated (LH sponge) Where Assessed - Lower Body Bathing: Unsupported sitting Lower Body Dressing: Simulated;Performed;Moderate assistance;Maximal assistance;Other (comment) (reacher, sock aid) Where Assessed - Lower Body Dressing: Unsupported sitting;Supported sit to stand Toilet Transfer: Performed;Min guard Toilet Transfer Method: Other (comment) (ambulating from RW level) Toileting - Clothing Manipulation and Hygiene: Performed;Minimal assistance Where Assessed - Engineer, mining and Hygiene: Standing Equipment Used: Rolling walker;Sock aid;Long-handled shoe horn;Long-handled sponge;Reacher;Gait belt ADL Comments: Pt demos competent use of ADL A/E for bathing and dressing tasks    OT Diagnosis:    OT Problem List:   OT Treatment Interventions:     OT Goals ADL Goals ADL Goal: Lower Body Dressing - Progress: Progressing toward goals ADL Goal: Toilet Transfer - Progress: Progressing toward goals ADL Goal: Toileting - Clothing Manipulation - Progress:  Progressing toward goals ADL Goal: Additional Goal #2 - Progress: Progressing toward goals  Visit Information  Last OT Received On: 07/06/12     Subjective Data  Subjective: ' I should go home tomorrow ' Patient Stated Goal: to return home   Prior Functioning       Cognition  Overall Cognitive Status: Appears within functional limits for tasks assessed/performed Arousal/Alertness: Awake/alert Orientation Level: Appears intact for tasks assessed Behavior During Session: [facility] for tasks performed    Mobility  Shoulder Instructions Bed Mobility Bed Mobility: Not assessed Transfers Transfers: Sit to Stand;Stand to Sit Sit to Stand: 4: Min guard Stand to Sit: 4: Min guard Details for Transfer Assistance: cues for UE use and control descent to chair.         Exercises   Other Exercises Other Exercises: Pt provided with green theraband for B UE bicep/tricpe exercises, pt able to return demo Independently   Balance Balance Balance Assessed: No   End of Session OT - End of Session Equipment Utilized During Treatment: Gait belt;Other (comment) (RW, ADL A/E) Activity Tolerance: Patient tolerated treatment well Patient left: in chair;with call bell/phone within reach;with family/visitor present  GO     Galen Manila 07/06/2012, 3:34 PM

## 2012-07-06 NOTE — Progress Notes (Signed)
Physical Therapy Treatment Patient Details Name: Timothy Barton MRN: [id number] DOB: 06/21/1951 Today's Date: 07/06/2012 Time: 4696-2952 PT Time Calculation (min): 17 min  PT Assessment / Plan / Recommendation Comments on Treatment Session  pt rpesents with R THA.  pt moving well, but still requiring A for mobility.  pt notes not having had any pain meds since yesterday.  RN made aware.      Follow Up Recommendations  Home health PT;Supervision for mobility/OOB     Does the patient have the potential to tolerate intense rehabilitation     Barriers to Discharge        Equipment Recommendations  Rolling walker with 5" wheels    Recommendations for Other Services    Frequency 7X/week   Plan Discharge plan remains appropriate;Frequency remains appropriate    Precautions / Restrictions Precautions Precautions: Posterior Hip Precaution Comments: Reviewed 3/3 hip precautions Restrictions Weight Bearing Restrictions: Yes RLE Weight Bearing: Weight bearing as tolerated   Pertinent Vitals/Pain Indicates painful, but did not rate.  Notes has not had pain meds since yesterday.  RN made aware.      Mobility  Bed Mobility Bed Mobility: Supine to Sit;Sitting - Scoot to Edge of Bed Supine to Sit: 4: Min assist;HOB elevated;With rails Sitting - Scoot to Edge of Bed: 4: Min assist Details for Bed Mobility Assistance: A with R LE only.  pt utilized rails and HOB elevation to complete with minimal A.   Transfers Transfers: Sit to Stand;Stand to Sit Sit to Stand: 4: Min assist;With upper extremity assist;From bed Stand to Sit: 4: Min guard;With upper extremity assist;To chair/3-in-1;With armrests Details for Transfer Assistance: cues for UE use, getting closer to recliner prior to sitting and controlling descent.   Ambulation/Gait Ambulation/Gait Assistance: 4: Min guard Ambulation Distance (Feet): 80 Feet Assistive device: Rolling walker Ambulation/Gait Assistance Details: cues to  slide RW instead of lifting it, and to slow down.   Gait Pattern: Decreased step length - left;Decreased stance time - right;Step-to pattern Stairs: No Wheelchair Mobility Wheelchair Mobility: No    Exercises Total Joint Exercises Ankle Circles/Pumps: AROM;Both;10 reps Long Arc Quad: AROM;Right;10 reps   PT Diagnosis:    PT Problem List:   PT Treatment Interventions:     PT Goals Acute Rehab PT Goals Time For Goal Achievement: 07/11/12 Potential to Achieve Goals: Good PT Goal: Supine/Side to Sit - Progress: Progressing toward goal PT Goal: Sit to Stand - Progress: Progressing toward goal PT Goal: Stand to Sit - Progress: Progressing toward goal PT Goal: Ambulate - Progress: Progressing toward goal Additional Goals PT Goal: Additional Goal #1 - Progress: Progressing toward goal  Visit Information  Last PT Received On: 07/06/12 Assistance Needed: +1    Subjective Data  Subjective: They set my breakfast off to the side and I haven't been able to get it.     Cognition  Overall Cognitive Status: Appears within functional limits for tasks assessed/performed Arousal/Alertness: Awake/alert Orientation Level: Appears intact for tasks assessed Behavior During Session: [facility] for tasks performed    Balance  Balance Balance Assessed: No  End of Session PT - End of Session Equipment Utilized During Treatment: Gait belt Activity Tolerance: Patient tolerated treatment well Patient left: in chair;with call bell/phone within reach Nurse Communication: Mobility status;Patient requests pain meds   GP     Sunny Schlein, [place] [phone] 07/06/2012, 8:12 AM

## 2012-07-06 NOTE — Progress Notes (Signed)
UR COMPLETED  

## 2012-07-06 NOTE — Progress Notes (Signed)
Subjective: 3 Days Post-Op Procedure(s) (LRB): TOTAL HIP ARTHROPLASTY (Right) Patient reports pain as mild to moderate. +flatus and BM Feels making progress with PT/OT Denies dizziness, SOB, lightheadedness, fatigue  Objective: Vital signs in last 24 hours: Temp:  [98.3 F (36.8 C)-100.4 F (38 C)] 98.3 F (36.8 C) (01/13 0619) Pulse Rate:  [83-107] 83  (01/13 0619) Resp:  [18-19] 18  (01/13 0800) BP: (104-107)/(44-58) 106/44 mmHg (01/13 0619) SpO2:  [97 %] 97 % (01/13 0800)  Intake/Output from previous day: 01/12 0701 - 01/13 0700 In: -  Out: 750 [Urine:750] Intake/Output this shift:     Basename 07/06/12 0630 07/05/12 0515 07/04/12 0710 07/03/12 1522  HGB 8.7* 9.5* 10.1* 12.4*    Basename 07/06/12 0630 07/05/12 0515  WBC 6.7 9.6  RBC 2.79* 3.09*  HCT 25.4* 28.0*  PLT 81* 109*    Basename 07/04/12 0710 07/03/12 1522  NA 134* --  K 4.4 --  CL 96 --  CO2 28 --  BUN 26* --  CREATININE 1.88* 0.90  GLUCOSE 119* --  CALCIUM 8.4 --   No results found for this basename: LABPT:2,INR:2 in the last 72 hours  Neurovascular intact Sensation intact distally Intact pulses distally Dorsiflexion/Plantar flexion intact Incision: scant drainage No cellulitis present Compartment soft Patient able to stand from seated position with assist stand with walker for dressing change today He does have some blistering it appears where the surgical drape adhesive was over the right buttock no surrounding erythema or cellulitis.    Assessment/Plan: 3 Days Post-Op Procedure(s) (LRB): TOTAL HIP ARTHROPLASTY (Right) Up with therapy Discharge home with home health tomorrow, feel patient would benefit from additional day of PT/OT prior to d/c home tomorrow, this plan was discussed over the weekend in efforts to avoid SNF need and feel it is appropriate.   Recheck BMP today.  Continue pain control as needed Continue lovenox for dvt proph   Georgene Kopper 07/06/2012, 1:21 PM

## 2012-07-06 NOTE — Progress Notes (Signed)
Pt put himself on home unit. Pt is comfortable. RT will monitor.

## 2012-07-06 NOTE — Progress Notes (Signed)
CARE MANAGEMENT NOTE 07/06/2012  Patient:  Timothy Barton, Timothy Barton   Account Number:  [id number]  Date Initiated:  07/06/2012  Documentation initiated by:  Vance Peper  Subjective/Objective Assessment:   62 yr old male s/p right total hip arthroplasty.     Action/Plan:   CM spoke with patient concerning home health and DME needs. Choice offered. Patient has rolling walker, 3in1 and shower seat. Has family support at discharge.   Anticipated DC Date:  07/07/2012   Anticipated DC Plan:  HOME W HOME HEALTH SERVICES      DC Planning Services  CM consult      [facility] Choice  HOME HEALTH   Choice offered to / List presented to:  C-1 Patient        HH arranged  HH-2 PT  HH-3 OT      [facility] agency  [facility]   Status of service:  Completed, signed off Medicare Important Message given?   (If response is "NO", the following Medicare IM given date fields will be blank) Date Medicare IM given:   Date Additional Medicare IM given:    Discharge Disposition:  HOME W HOME HEALTH SERVICES  Per UR Regulation:    If discussed at Long Length of Stay Meetings, dates discussed:    Comments:

## 2012-07-06 NOTE — Progress Notes (Signed)
Physical Therapy Treatment Patient Details Name: Timothy Barton MRN: [id number] DOB: 10-05-1950 Today's Date: 07/06/2012 Time: 4098-1191 PT Time Calculation (min): 27 min  PT Assessment / Plan / Recommendation Comments on Treatment Session  pt presents with R THA.  pt movign well this pm and notes plan is for him to D/C tomorrow.  Will f/u tomorrow morning.      Follow Up Recommendations  Home health PT;Supervision for mobility/OOB     Does the patient have the potential to tolerate intense rehabilitation     Barriers to Discharge        Equipment Recommendations  Rolling walker with 5" wheels    Recommendations for Other Services    Frequency 7X/week   Plan Discharge plan remains appropriate;Frequency remains appropriate    Precautions / Restrictions Precautions Precautions: Posterior Hip Precaution Comments: pt able to verbalize 3/3 hip precautions.   Restrictions Weight Bearing Restrictions: Yes RLE Weight Bearing: Weight bearing as tolerated   Pertinent Vitals/Pain Indicates pain better this pm.    Mobility  Bed Mobility Bed Mobility: Not assessed Transfers Transfers: Sit to Stand;Stand to Sit Sit to Stand: 4: Min guard;With upper extremity assist;From chair/3-in-1;With armrests Stand to Sit: 4: Min guard;With upper extremity assist;To chair/3-in-1;With armrests Details for Transfer Assistance: cues for UE use and control descent to chair.   Ambulation/Gait Ambulation/Gait Assistance: 4: Min guard Ambulation Distance (Feet): 140 Feet Assistive device: Rolling walker Ambulation/Gait Assistance Details: cues for positionign in RW, upright posture, and to slow down.  pt tends to start ruching on way back to room.   Gait Pattern: Decreased step length - left;Decreased stance time - right;Step-to pattern Stairs: No Wheelchair Mobility Wheelchair Mobility: No    Exercises Total Joint Exercises Ankle Circles/Pumps: AROM;Both;15 reps Towel Squeeze: AROM;Both;10  reps Short Arc Quad: AROM;Right;10 reps Hip ABduction/ADduction: AAROM;Right;10 reps Straight Leg Raises: AAROM;Right;10 reps Long Arc Quad: AROM;Right;10 reps   PT Diagnosis:    PT Problem List:   PT Treatment Interventions:     PT Goals Acute Rehab PT Goals Time For Goal Achievement: 07/11/12 Potential to Achieve Goals: Good PT Goal: Sit to Stand - Progress: Progressing toward goal PT Goal: Stand to Sit - Progress: Progressing toward goal PT Goal: Ambulate - Progress: Progressing toward goal Additional Goals PT Goal: Additional Goal #1 - Progress: Progressing toward goal  Visit Information  Last PT Received On: 07/06/12 Assistance Needed: +1    Subjective Data  Subjective: pt agreeable to PT.     Cognition  Overall Cognitive Status: Appears within functional limits for tasks assessed/performed Arousal/Alertness: Awake/alert Orientation Level: Appears intact for tasks assessed Behavior During Session: [facility] for tasks performed    Balance  Balance Balance Assessed: No  End of Session PT - End of Session Equipment Utilized During Treatment: Gait belt Activity Tolerance: Patient tolerated treatment well Patient left: in chair;with call bell/phone within reach Nurse Communication: Mobility status;Patient requests pain meds   GP     Sunny Schlein, [place] [phone] 07/06/2012, 2:14 PM

## 2012-07-07 ENCOUNTER — Encounter (HOSPITAL_COMMUNITY): Payer: Self-pay | Admitting: General Practice

## 2012-07-07 DIAGNOSIS — L259 Unspecified contact dermatitis, unspecified cause: Secondary | ICD-10-CM | POA: Diagnosis not present

## 2012-07-07 MED ORDER — ENOXAPARIN SODIUM 40 MG/0.4ML ~~LOC~~ SOLN: 40.0000 mg | Freq: Two times a day (BID) | SUBCUTANEOUS | Status: DC

## 2012-07-07 MED ORDER — DIPHENHYDRAMINE-ZINC ACETATE 2-0.1 % EX CREA
TOPICAL_CREAM | Freq: Three times a day (TID) | CUTANEOUS | Status: DC | PRN
  Filled 2012-07-07 (×2): qty 28

## 2012-07-07 NOTE — Progress Notes (Signed)
Subjective: 4 Days Post-Op Procedure(s) (LRB): TOTAL HIP ARTHROPLASTY (Right) Patient reports pain as mild and moderate.   Pt reports rash on back of neck and legs/buttock area, itching. Denies SOB, dizziness, lightheadedness, chills, sweats, N/V.   Objective: Vital signs in last 24 hours: Temp:  [98.1 F (36.7 C)-99.1 F (37.3 C)] 98.2 F (36.8 C) (01/14 0533) Pulse Rate:  [79-93] 79  (01/14 0533) Resp:  [18-20] 18  (01/14 0800) BP: (109-120)/(43-62) 112/62 mmHg (01/14 0533) SpO2:  [98 %-100 %] 98 % (01/14 0800)  Intake/Output from previous day: 01/13 0701 - 01/14 0700 In: 630 [P.O.:630] Out: 1200 [Urine:1200] Intake/Output this shift:     Basename 07/06/12 0630 07/05/12 0515  HGB 8.7* 9.5*    Basename 07/06/12 0630 07/05/12 0515  WBC 6.7 9.6  RBC 2.79* 3.09*  HCT 25.4* 28.0*  PLT 81* 109*    Basename 07/06/12 1318  NA 135  K 4.2  CL 95*  CO2 28  BUN 31*  CREATININE 1.10  GLUCOSE 117*  CALCIUM 8.9   No results found for this basename: LABPT:2,INR:2 in the last 72 hours  Neurovascular intact Sensation intact distally Dorsiflexion/Plantar flexion intact Incision: scant drainage No cellulitis present Erythematous itchy rash post neck and buttock/legs, no drainage Blisters superior to dressing likely from OR drape, no surrounding erythema, no drainage  Assessment/Plan: 4 Days Post-Op Procedure(s) (LRB): TOTAL HIP ARTHROPLASTY (Right) Discharge home with home health Ordered benadryl cream to itchy rash areas, appears to likely be contact dermatitis Small amount of antibiotic ointment to blistered areas Dressing change today prior to d/c home  Timothy Barton, Ivin Booty 07/07/2012, 9:15 AM

## 2012-07-07 NOTE — Progress Notes (Signed)
Discharge instructions completed with patient and daughter. Prescriptions given. All questions answered. Patient discharged with some dressing supplies. Patient discharged with volunteer services to private car.

## 2012-07-07 NOTE — Progress Notes (Signed)
Physical Therapy Treatment Patient Details Name: Timothy Barton MRN: [id number] DOB: 05-05-1951 Today's Date: 07/07/2012 Time: 4098-1191 PT Time Calculation (min): 33 min  PT Assessment / Plan / Recommendation Comments on Treatment Session  pt presents with R THA.  pt moving well and feels confident to go home.  Reviewed car transfer and bedroom set-up with pt and wife, as well as returning to activity at home.  pt ready for D/C from PT stand point.      Follow Up Recommendations  Home health PT;Supervision for mobility/OOB     Does the patient have the potential to tolerate intense rehabilitation     Barriers to Discharge        Equipment Recommendations  Rolling walker with 5" wheels    Recommendations for Other Services    Frequency 7X/week   Plan Discharge plan remains appropriate;Frequency remains appropriate    Precautions / Restrictions Precautions Precautions: Posterior Hip Precaution Comments: pt able to verbalize 3/3 hip precautions.   Restrictions Weight Bearing Restrictions: Yes RLE Weight Bearing: Weight bearing as tolerated   Pertinent Vitals/Pain Indicates pain better today, only a little stiff.    Mobility  Bed Mobility Bed Mobility: Not assessed Transfers Transfers: Sit to Stand;Stand to Sit Sit to Stand: 5: Supervision;With upper extremity assist;From chair/3-in-1 Stand to Sit: 5: Supervision;With upper extremity assist;To chair/3-in-1 Details for Transfer Assistance: cues for use of UEs to control descent to chair Ambulation/Gait Ambulation/Gait Assistance: 5: Supervision Ambulation Distance (Feet): 300 Feet Assistive device: Rolling walker Ambulation/Gait Assistance Details: cues for upright posture and to stay closer to RW.   Gait Pattern: Decreased step length - left;Decreased stance time - right;Step-to pattern Stairs: Yes Stairs Assistance: 4: Min guard Stairs Assistance Details (indicate cue type and reason): Demos good technique.   Stair  Management Technique: Two rails;Forwards Number of Stairs: 2  Wheelchair Mobility Wheelchair Mobility: No    Exercises     PT Diagnosis:    PT Problem List:   PT Treatment Interventions:     PT Goals Acute Rehab PT Goals Time For Goal Achievement: 07/11/12 Potential to Achieve Goals: Good PT Goal: Sit to Stand - Progress: Met PT Goal: Stand to Sit - Progress: Met PT Goal: Ambulate - Progress: Met PT Goal: Up/Down Stairs - Progress: Met Additional Goals PT Goal: Additional Goal #1 - Progress: Met  Visit Information  Last PT Received On: 07/07/12 Assistance Needed: +1    Subjective Data  Subjective: I'm ready to go home.     Cognition  Overall Cognitive Status: Appears within functional limits for tasks assessed/performed Arousal/Alertness: Awake/alert Orientation Level: Appears intact for tasks assessed Behavior During Session: [facility] for tasks performed    Balance  Balance Balance Assessed: No  End of Session PT - End of Session Equipment Utilized During Treatment: Gait belt Activity Tolerance: Patient tolerated treatment well Patient left: in chair;with call bell/phone within reach;with family/visitor present Nurse Communication: Mobility status   GP     Sunny Schlein, [place] [phone] 07/07/2012, 9:20 AM

## 2012-07-16 NOTE — Discharge Summary (Signed)
Home PATIENT ID: Timothy Barton        MRN:  [id number]          DOB/AGE: [birth year] / 62 y.o.    DISCHARGE SUMMARY  ADMISSION DATE:    07/03/2012 DISCHARGE DATE:  07/07/2012  ADMISSION DIAGNOSIS: OA RIGHT HIP    DISCHARGE DIAGNOSIS:  OA RIGHT HIP    ADDITIONAL DIAGNOSIS: Principal Problem:  *Osteoarthritis of right hip Active Problems:  Contact dermatitis  Past Medical History  Diagnosis Date  . Hypertension   . Sleep apnea   . Arthritis   . Osteoarthritis of right hip 07/05/2012    PROCEDURE: Procedure(s): TOTAL HIP ARTHROPLASTY  Right  on 07/03/2012  CONSULTS:   PT/OT  HISTORY: see admission H&P   HOSPITAL COURSE:  Timothy Barton is a 62 y.o. admitted on 07/03/2012 and found to have a diagnosis of OA RIGHT HIP.  After appropriate laboratory studies were obtained  they were taken to the operating room on 07/03/2012 and underwent  Right Procedure(s): TOTAL HIP ARTHROPLASTY.   They were given perioperative antibiotics:  Anti-infectives     Start     Dose/Rate Route Frequency Ordered Stop   07/03/12 1345   ceFAZolin (ANCEF) IVPB 2 g/50 mL premix        2 g 100 mL/hr over 30 Minutes Intravenous Every 6 hours 07/03/12 1335 07/03/12 2001   07/03/12 0600   ceFAZolin (ANCEF) 3 g in dextrose 5 % 50 mL IVPB        3 g 160 mL/hr over 30 Minutes Intravenous On call to O.R. 07/02/12 1444 07/03/12 0750        .  Tolerated the procedure well.  Placed with a foley intraoperatively.  Given Ofirmev at induction and for 24 hours.    POD #1, allowed out of bed to a chair.  PT for ambulation and exercise program.  Foley D/C'd in morning.  IV saline locked.  O2 discontionued.  POD #2, continued PT and ambulation. Dressing changed.  Patient was slow with mobilization was recommended by therapy for SNF vs another day with inpatient PT/OT.  Plan was discussed and arranged discharge home with HHPT for 07/07/2012.  The remainder of the hospital course was dedicated to ambulation and  strengthening.   The patient was discharged on 13 Days Post-Op in  Stable condition.  Blood products given:  none  DIAGNOSTIC STUDIES: Recent vital signs: No data found.      Recent laboratory studies: No results found for this basename: WBC:7,HGB:7,HCT:7,PLT:7 in the last 168 hours No results found for this basename: NA:7,K:7,CL:7,CO2:7,BUN:7,CREATININE:7,GLUCOSE:7,CALCIUM:7 in the last 168 hours Lab Results  Component Value Date   INR 1.07 07/01/2012   INR 1.17 12/26/2010   INR 1.07 11/14/2010     Recent Radiographic Studies :  Dg Chest 2 View  07/01/2012  *RADIOLOGY REPORT*  Clinical Data: Hypertension, preop evaluation for right hip arthroplasty, sleep apnea  CHEST - 2 VIEW  Comparison: 11/14/2010  Findings: Normal heart size and vascularity.  No focal pneumonia, collapse, consolidation, edema, effusion or pneumothorax.  Right upper lobe prominent vascular marking versus small calcified granuloma, there is stable.  Degenerative changes of the spine with mild scoliosis.  No compression fracture evident.  No significant interval change.  IMPRESSION: Stable chest exam.  No superimposed acute process   Original Report Authenticated By: Judie Petit. Miles Costain, M.D.    Dg Pelvis Portable  07/03/2012  *RADIOLOGY REPORT*  Clinical Data: Postop  PORTABLE PELVIS  Comparison: 11/22/2010  Findings:  Right total hip arthroplasty has been performed. Anatomic alignment of the osseous and prosthetic structures.  No breakage or loosening of the hardware. No acute fracture.  IMPRESSION: Right total hip arthroplasty anatomically aligned.   Original Report Authenticated By: Timothy Barton, M.D.    Dg Hip Portable 1 View Right  07/03/2012  *RADIOLOGY REPORT*  Clinical Data: Postop  PORTABLE RIGHT HIP - 1 VIEW  Comparison: 11/22/2010  Findings: Right total hip arthroplasty has been performed. Anatomic alignment of the osseous and prosthetic structures.  No breakage or loosening of the hardware.  IMPRESSION: Total hip arthroplasty  anatomically aligned.   Original Report Authenticated By: Timothy Barton, M.D.     DISCHARGE INSTRUCTIONS:   DISCHARGE MEDICATIONS:     Medication List     As of 07/16/2012  8:33 AM    STOP taking these medications         aspirin EC 81 MG tablet      diclofenac 75 MG EC tablet   Commonly known as: VOLTAREN      HYDROcodone-acetaminophen 10-325 MG per tablet   Commonly known as: NORCO      TAKE these medications         acetaminophen 500 MG tablet   Commonly known as: TYLENOL   Take 1,000 mg by mouth 2 (two) times daily as needed. For pain      enoxaparin 40 MG/0.4ML injection   Commonly known as: LOVENOX   Inject 0.4 mLs (40 mg total) into the skin every 12 (twelve) hours.      Fish Oil 1360 MG Caps   Take 1,360 mg by mouth daily.      GLUCOSAMINE CHOND COMPLEX/MSM PO   Take 60 mLs by mouth daily. Glucosamine 2000, chondroiton 1200, msm 500      methocarbamol 750 MG tablet   Commonly known as: ROBAXIN   Take 1 tablet (750 mg total) by mouth 4 (four) times daily.      oxyCODONE 5 MG immediate release tablet   Commonly known as: Oxy IR/ROXICODONE   1-2 tabs po q4-6hrs prn pain      valsartan-hydrochlorothiazide 320-12.5 MG per tablet   Commonly known as: DIOVAN-HCT   Take 1 tablet by mouth daily.        FOLLOW UP VISIT:       Follow-up Information    Schedule an appointment as soon as possible for a visit with CAFFREY JR,W D, MD. (to be seen on 07/16/12)    Contact information:   [address] [place] [address] [phone]          DISPOSITION:  Home with HHPT  CONDITION:  {Stable  Margart Sickles 07/16/2012, 8:33 AM

## 2012-07-27 ENCOUNTER — Ambulatory Visit (HOSPITAL_COMMUNITY)
Admission: RE | Admit: 2012-07-27 | Discharge: 2012-07-27 | Disposition: A | Discharge status: Home / Self Care | Payer: BC Managed Care – PPO | Source: Ambulatory Visit | Attending: Orthopedic Surgery | Admitting: Orthopedic Surgery

## 2012-07-27 DIAGNOSIS — I10 Essential (primary) hypertension: Secondary | ICD-10-CM | POA: Insufficient documentation

## 2012-07-27 DIAGNOSIS — Z96649 Presence of unspecified artificial hip joint: Secondary | ICD-10-CM | POA: Insufficient documentation

## 2012-07-27 DIAGNOSIS — M6281 Muscle weakness (generalized): Secondary | ICD-10-CM | POA: Insufficient documentation

## 2012-07-27 DIAGNOSIS — IMO0001 Reserved for inherently not codable concepts without codable children: Secondary | ICD-10-CM | POA: Insufficient documentation

## 2012-07-27 DIAGNOSIS — M25559 Pain in unspecified hip: Secondary | ICD-10-CM | POA: Insufficient documentation

## 2012-07-27 DIAGNOSIS — R29898 Other symptoms and signs involving the musculoskeletal system: Secondary | ICD-10-CM | POA: Insufficient documentation

## 2012-07-27 DIAGNOSIS — M25569 Pain in unspecified knee: Secondary | ICD-10-CM | POA: Insufficient documentation

## 2012-07-27 DIAGNOSIS — M549 Dorsalgia, unspecified: Secondary | ICD-10-CM | POA: Insufficient documentation

## 2012-07-27 DIAGNOSIS — R269 Unspecified abnormalities of gait and mobility: Secondary | ICD-10-CM | POA: Insufficient documentation

## 2012-07-27 NOTE — Evaluation (Signed)
Physical Therapy Evaluation  Patient Details  Name: Timothy Barton MRN: [id number] Date of Birth: 05-12-1951  Today's Date: 07/27/2012  Time: 4098-1191  PT Time Calculation (min): 42 min  Charges: 1 eval, 10' TE Visit#: 1 of 8  Re-eval: 08/26/12  Re-eval:   Assessment Diagnosis: R THA Surgical Date: 07/03/12 Next MD Visit: Dr. Christian Mate - Feb 17th  Past Medical History:  Past Medical History  Diagnosis Date  . Hypertension   . Sleep apnea   . Arthritis   . Osteoarthritis of right hip 07/05/2012   Past Surgical History:  Past Surgical History  Procedure Date  . Carpal tunnel release 2002    right and left  . Arthrosopy   . Knee arthroscopy 2006    right  . Back surgery 2006     ruptured disc,and repair of leak x3  . Total hip arthroplasty 07/03/2012    Procedure: TOTAL HIP ARTHROPLASTY;  Surgeon: Thera Flake., MD;  Location: [facility] OR;  Service: Orthopedics;  Laterality: Right;  with autograft   Subjective Symptoms/Limitations Symptoms: PMH: B knee pain, back pain, L hip pain Pertinent History: Pt is referred to PT for R THA on 07/03/12 secondary to R hip OA. His c/co is decreased stamina, increased hip and leg stiffness.  How long can you stand comfortably?: 5 minutes  How long can you walk comfortably?: 5-10 minutes independently Patient Stated Goals: Pt wants to gain his strength and stamina back.  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   Sensation/Coordination/Flexibility/Functional Tests Functional Tests Functional Tests: 5 STS w/o UE A: 13  sec Functional Tests: ABC: 70%  Assessment RLE Strength Right Hip Flexion: 3+/5 Right Hip Extension: 3+/5 Right Hip ABduction: 3+/5 Right Knee Flexion: 4/5 (increased cramping to hamstring) Right Knee Extension: 4/5  Mobility/Balance  Ambulation/Gait Ambulation/Gait Assistance: 7: Independent Gait Pattern: Decreased step length - left;Decreased stance time - right;Step-to pattern;Antalgic;Lateral hip instability Stairs Assistance: 7: Independent;6: Modified independent (Device/Increase time) Stair Management Technique: No rails;One rail Right Number of Stairs: 5  Static Standing Balance Single Leg Stance - Right Leg: 0  Single Leg Stance - Left Leg: 2  Tandem Stance - Right Leg: 0  Tandem Stance - Left Leg: 7  Rhomberg - Eyes Opened: 10  Rhomberg - Eyes Closed: 10  Dynamic Gait Index Level Surface: Mild Impairment Change in Gait Speed: Moderate Impairment Gait with Horizontal Head Turns: Moderate Impairment Gait with Vertical Head Turns: Moderate Impairment Gait and Pivot Turn: Mild Impairment Step Over Obstacle: Mild Impairment Step Around Obstacles: Moderate Impairment Steps: Mild Impairment Total Score: 12    Exercise/Treatments Standing Heel Raises: 10 reps;Limitations Heel Raises Limitations: no HHA, toe raises 10 reps w/o HHA Functional Squat: 15 reps Stairs: Gym Steps 3 RT w/1-2 HHA on handrail Supine Bridges: 10 reps Straight Leg Raises: Right;5 reps Other Supine Knee Exercises: TrA w/R LE marching x10 w/cueing for breathing  Physical Therapy Assessment and Plan   PT Assessment and Plan Clinical Impression Statement: Pt is a 62 year old male referred to PT s/p R THA on 07/03/12.  After examination and discussing with patient he has a physically demanding job which is contemplating possible return.  At  this time due to his lack of activity tolerance, impaired balance and decreased functional strength feel he would not be safe to return to work.   Pt will  benefit from skilled therapeutic intervention in order to improve on the following deficits: Abnormal gait;Decreased balance;Decreased activity tolerance;Decreased strength;Pain;Impaired perceived functional ability Rehab Potential: Good PT Frequency: Min 2X/week PT Duration: 8 weeks PT Treatment/Interventions: Gait training;Stair training;Functional mobility training;Therapeutic activities;Therapeutic exercise;Balance training;Neuromuscular re-education;Patient/family education;Manual techniques PT Plan: Improve activity tolerance (TM for gait training at slow speed to improve gait mechanics), Standing strengthening and balance activities (side stepping, tandem gait, retro gait, squats, heel/toe raises, knee flexion, rocker board, stair training, hip hikes).      Goals Home Exercise Program Pt will Perform Home Exercise Program: Independently PT Goal: Perform Home Exercise Program - Progress: Goal set today PT Short Term Goals Time to Complete Short Term Goals: 4 weeks PT Short Term Goal 1: Pt will improve functional strength and demonstrate 5 STS in 8.4 sec for normalitive values. PT Short Term Goal 2: Pt will improve static standing balance on R and L LE on solid surface x10 sec for improved joint kinesthtic awareness.   PT Short Term Goal 3: Pt will present with mild gait impairments when ambulating for 10 minutes to decrease risk of secondary injury. PT Short Term Goal 4: Pt will improve activity tolerance and tolerate standing and walking for greater than 10 minutes in order to return to short community ambulation.   PT Long Term Goals Time to Complete Long Term Goals: 8 weeks PT Long Term Goal 1: Pt will improve his dynamic gait and score greater than or equal to a 20/24 on the DGI to decrease fall risk in community.   PT Long Term Goal 2: Pt will improve his activity tolerance and ambulate x20 minutes in indoor and outdoor environment for improved community ambulation.   Long Term Goal  3: Pt will score an 80% on the ABC for improved percieved functional ability.   Long Term Goal 4: Pt will improve LE functional strength and balance to WNL in order to climb a ladder for safe RTW activities.   Problem List Patient Active Problem List   Diagnosis   .  Osteoarthritis of right hip   .  Contact dermatitis   .  Abnormality of gait   .  Hip weakness     Milton Sagona, PT 07/27/2012, 1:53 PM  Physician Documentation Your signature is required to indicate approval of the treatment plan as stated above.  Please sign and either send electronically or make a copy of this report for your files and return this physician signed original.   Please mark one 1.__approve of plan  2. ___approve of plan with the following conditions.   ______________________________                                                          _____________________ Physician Signature  Date  

## 2012-07-30 ENCOUNTER — Inpatient Hospital Stay (HOSPITAL_COMMUNITY): Admission: RE | Admit: 2012-07-30 | Payer: BC Managed Care – PPO | Source: Ambulatory Visit

## 2012-08-03 ENCOUNTER — Inpatient Hospital Stay (HOSPITAL_COMMUNITY)
Admission: RE | Admit: 2012-08-03 | Payer: BC Managed Care – PPO | Source: Ambulatory Visit | Admitting: Physical Therapy

## 2012-08-06 ENCOUNTER — Ambulatory Visit (HOSPITAL_COMMUNITY): Payer: BC Managed Care – PPO

## 2012-08-10 ENCOUNTER — Ambulatory Visit (HOSPITAL_COMMUNITY): Payer: BC Managed Care – PPO | Admitting: Physical Therapy

## 2012-08-13 ENCOUNTER — Ambulatory Visit (HOSPITAL_COMMUNITY): Payer: BC Managed Care – PPO

## 2012-08-17 ENCOUNTER — Ambulatory Visit (HOSPITAL_COMMUNITY): Payer: BC Managed Care – PPO | Admitting: Physical Therapy

## 2012-08-20 ENCOUNTER — Ambulatory Visit (HOSPITAL_COMMUNITY): Payer: BC Managed Care – PPO | Admitting: Physical Therapy

## 2013-09-27 ENCOUNTER — Encounter (HOSPITAL_COMMUNITY): Payer: Self-pay | Admitting: Emergency Medicine

## 2013-09-27 ENCOUNTER — Emergency Department (HOSPITAL_COMMUNITY)
Admission: EM | Admit: 2013-09-27 | Discharge: 2013-09-27 | Disposition: A | Discharge status: Home / Self Care | Payer: BC Managed Care – PPO | Source: Home / Self Care

## 2013-09-27 DIAGNOSIS — L03113 Cellulitis of right upper limb: Secondary | ICD-10-CM

## 2013-09-27 DIAGNOSIS — T148XXA Other injury of unspecified body region, initial encounter: Secondary | ICD-10-CM

## 2013-09-27 DIAGNOSIS — L02519 Cutaneous abscess of unspecified hand: Secondary | ICD-10-CM

## 2013-09-27 DIAGNOSIS — IMO0002 Reserved for concepts with insufficient information to code with codable children: Secondary | ICD-10-CM

## 2013-09-27 DIAGNOSIS — L03119 Cellulitis of unspecified part of limb: Secondary | ICD-10-CM

## 2013-09-27 DIAGNOSIS — W64XXXA Exposure to other animate mechanical forces, initial encounter: Secondary | ICD-10-CM

## 2013-09-27 MED ORDER — TETANUS-DIPHTH-ACELL PERTUSSIS 5-2.5-18.5 LF-MCG/0.5 IM SUSP
0.5000 mL | Freq: Once | INTRAMUSCULAR | Status: AC
  Administered 2013-09-27: 0.5 mL via INTRAMUSCULAR

## 2013-09-27 MED ORDER — TETANUS-DIPHTH-ACELL PERTUSSIS 5-2.5-18.5 LF-MCG/0.5 IM SUSP
INTRAMUSCULAR | Status: AC
  Filled 2013-09-27: qty 0.5

## 2013-09-27 MED ORDER — AMOXICILLIN-POT CLAVULANATE 875-125 MG PO TABS: 1.0000 | ORAL_TABLET | Freq: Two times a day (BID) | ORAL | Status: DC

## 2013-09-27 NOTE — ED Provider Notes (Signed)
CSN: [id number]     Arrival date & time 09/27/13  0946 History   First MD Initiated Contact with Patient 09/27/13 1032     Chief Complaint  Patient presents with  . Rash   (Consider location/radiation/quality/duration/timing/severity/associated sxs/prior Treatment) HPI Comments: 63 year old man is complaining of redness to the dorsum of the right hand for 3 days. This follows a scratch mark to the dorsum of the hand made by his dog. The wound has no swelling or drainage. The patient denies constitutional symptoms. Denies pain or limited range of motion.  Patient is a 63 y.o. male presenting with rash.  Rash   Past Medical History  Diagnosis Date  . Hypertension   . Sleep apnea   . Arthritis   . Osteoarthritis of right hip 07/05/2012   Past Surgical History  Procedure Laterality Date  . Carpal tunnel release  2002    right and left  . Arthrosopy    . Knee arthroscopy  2006    right  . Back surgery  2006     ruptured disc,and repair of leak x3  . Total hip arthroplasty  07/03/2012    Procedure: TOTAL HIP ARTHROPLASTY;  Surgeon: Yvette Rack., MD;  Location: [facility];  Service: Orthopedics;  Laterality: Right;  with autograft   History reviewed. No pertinent family history. History  Substance Use Topics  . Smoking status: Former Smoker -- 1.50 packs/day for 21 years    Quit date: 07/01/1998  . Smokeless tobacco: Former Systems developer    Types: [place] date: 07/01/1982  . Alcohol Use: No    Review of Systems  Constitutional: Negative.   Respiratory: Negative.   Gastrointestinal: Negative.   Musculoskeletal: Negative.   Skin: Positive for color change and wound. Negative for rash.  Neurological: Negative.     Allergies  Review of patient's allergies indicates no known allergies.  Home Medications   Current Outpatient Rx  Name  Route  Sig  Dispense  Refill  . acetaminophen (TYLENOL) 500 MG tablet   Oral   Take 1,000 mg by mouth 2 (two) times daily as needed. For pain        . amoxicillin-clavulanate (AUGMENTIN) 875-125 MG per tablet   Oral   Take 1 tablet by mouth every 12 (twelve) hours.   14 tablet   0   . enoxaparin (LOVENOX) 40 MG/0.4ML injection   Subcutaneous   Inject 0.4 mLs (40 mg total) into the skin every 12 (twelve) hours.   24 Syringe   0   . methocarbamol (ROBAXIN-750) 750 MG tablet   Oral   Take 1 tablet (750 mg total) by mouth 4 (four) times daily.   60 tablet   0   . Misc Natural Products (GLUCOSAMINE CHOND COMPLEX/MSM PO)   Oral   Take 60 mLs by mouth daily. Glucosamine 2000, chondroiton 1200, msm 500         . Omega-3 Fatty Acids (FISH OIL) 1360 MG CAPS   Oral   Take 1,360 mg by mouth daily.         Marland Kitchen oxyCODONE (ROXICODONE) 5 MG immediate release tablet      1-2 tabs po q4-6hrs prn pain   90 tablet   0   . valsartan-hydrochlorothiazide (DIOVAN-HCT) 320-12.5 MG per tablet   Oral   Take 1 tablet by mouth daily.          BP 144/73  Pulse 68  Temp(Src) 98.7 F (37.1 C) (Oral)  Resp  14  SpO2 96% Physical Exam  Nursing note and vitals reviewed. Constitutional: He appears well-developed and well-nourished. No distress.  Neck: Normal range of motion. Neck supple.  Pulmonary/Chest: Effort normal. No respiratory distress.  Musculoskeletal: Normal range of motion. He exhibits no edema and no tenderness.  Neurological: He is alert. He exhibits normal muscle tone.  Skin: Skin is warm and dry. No rash noted. There is erythema.  There is cutaneous erythema involving approximately 75% of the dorsum of the hand. There are no areas of lymphangitis. The wound is a superficial abrasion without swelling, puffiness or drainage. It is healing by secondary intention and has scabbed over. There does not appear to be infection in the wound itself. No tenderness to the hand, full range of motion, no swelling, distal neurovascular motor sensory is intact. No involvement of the wrist. Radial pulses 2+.  Psychiatric: He has a  normal mood and affect.    ED Course  Procedures (including critical care time) Labs Review Labs Reviewed - No data to display Imaging Review No results found.   MDM   1. Cellulitis of hand, right   2. Animal scratch     Early cellulitis limited to the dorsum of the R hand.  Augmentin 875 bid Warm compresses emphasized Tdap. Return if worse , new sx's    Janne Napoleon, NP 09/27/13 1111

## 2013-09-27 NOTE — ED Notes (Signed)
C/o rash on right hand Denies any itching or soreness States has used jock itch cream on hand Hand is red with heat

## 2013-09-27 NOTE — ED Provider Notes (Signed)
Medical screening examination/treatment/procedure(s) were performed by non-physician practitioner and as supervising physician I was immediately available for consultation/collaboration.  Philipp Deputy, M.D.  Harden Mo, MD 09/27/13 [phone]

## 2013-09-27 NOTE — Discharge Instructions (Signed)
Cellulitis °Cellulitis is an infection of the skin and the tissue under the skin. The infected area is usually red and tender. This happens most often in the arms and lower legs. °HOME CARE  °· Take your antibiotic medicine as told. Finish the medicine even if you start to feel better. °· Keep the infected arm or leg raised (elevated). °· Put a warm cloth on the area up to 4 times per day. °· Only take medicines as told by your doctor. °· Keep all doctor visits as told. °GET HELP RIGHT AWAY IF:  °· You have a fever. °· You feel very sleepy. °· You throw up (vomit) or have watery poop (diarrhea). °· You feel sick and have muscle aches and pains. °· You see red streaks on the skin coming from the infected area. °· Your red area gets bigger or turns a dark color. °· Your bone or joint under the infected area is painful after the skin heals. °· Your infection comes back in the same area or different area. °· You have a puffy (swollen) bump in the infected area. °· You have new symptoms. °MAKE SURE YOU:  °· Understand these instructions. °· Will watch your condition. °· Will get help right away if you are not doing well or get worse. °Document Released: 11/27/2007 Document Revised: 12/10/2011 Document Reviewed: 08/26/2011 °[employer]® Patient Information ©2014 [employer]. ° °

## 2014-10-21 ENCOUNTER — Ambulatory Visit: Payer: Self-pay | Admitting: Physician Assistant

## 2014-10-21 NOTE — H&P (Signed)
TOTAL HIP ADMISSION H&P  Patient is admitted for left total hip arthroplasty.  Subjective:  Chief Complaint: left hip pain  HPI: Timothy Barton, 64 y.o. male, has a history of pain and functional disability in the left hip(s) due to arthritis and patient has failed non-surgical conservative treatments for greater than 12 weeks to include NSAID's and/or analgesics, corticosteriod injections, use of assistive devices and activity modification.  Onset of symptoms was gradual starting >10 years ago with gradually worsening course since that time.The patient noted no past surgery on the left hip(s).  Patient currently rates pain in the left hip at 8 out of 10 with activity. Patient has night pain, worsening of pain with activity and weight bearing, trendelenberg gait, pain that interfers with activities of daily living and pain with passive range of motion. Patient has evidence of periarticular osteophytes and joint space narrowing by imaging studies. This condition presents safety issues increasing the risk of falls. There is no current active infection.  Patient Active Problem List   Diagnosis Date Noted  . Abnormality of gait 07/27/2012  . Hip weakness 07/27/2012  . Contact dermatitis 07/07/2012  . Osteoarthritis of right hip 07/05/2012   Past Medical History  Diagnosis Date  . Hypertension   . Sleep apnea   . Arthritis   . Osteoarthritis of right hip 07/05/2012    Past Surgical History  Procedure Laterality Date  . Carpal tunnel release  2002    right and left  . Arthrosopy    . Knee arthroscopy  2006    right  . Back surgery  2006     ruptured disc,and repair of leak x3  . Total hip arthroplasty  07/03/2012    Procedure: TOTAL HIP ARTHROPLASTY;  Surgeon: Yvette Rack., MD;  Location: [facility];  Service: Orthopedics;  Laterality: Right;  with autograft     (Not in a hospital admission) No Known Allergies  History  Substance Use Topics  . Smoking status: Former Smoker -- 1.50  packs/day for 21 years    Quit date: 07/01/1998  . Smokeless tobacco: Former Systems developer    Types: [place] date: 07/01/1982  . Alcohol Use: No    No family history on file.   Review of Systems  Constitutional: Negative.   HENT: Positive for tinnitus. Negative for congestion, ear discharge, ear pain, hearing loss, nosebleeds and sore throat.   Eyes: Negative.   Respiratory: Negative.  Negative for stridor.   Cardiovascular: Negative.   Gastrointestinal: Negative.   Genitourinary: Negative.   Musculoskeletal: Positive for myalgias and joint pain.  Skin: Negative.   Neurological: Negative.  Negative for headaches.  Endo/Heme/Allergies: Negative.   Psychiatric/Behavioral: Negative.     Objective:  Physical Exam  Constitutional: He is oriented to person, place, and time. He appears well-developed and well-nourished. No distress.  HENT:  Head: Normocephalic and atraumatic.  Nose: Nose normal.  Eyes: Conjunctivae and EOM are normal. Pupils are equal, round, and reactive to light.  Neck: Normal range of motion.  Cardiovascular: Normal rate, regular rhythm, normal heart sounds and intact distal pulses.   Respiratory: Effort normal and breath sounds normal. No respiratory distress. He has no wheezes.  GI: Soft. Bowel sounds are normal. He exhibits no distension. There is no tenderness.  Musculoskeletal:       Left hip: He exhibits decreased range of motion, decreased strength, tenderness and bony tenderness.  Lymphadenopathy:    He has no cervical adenopathy.  Neurological: He  is alert and oriented to person, place, and time. No cranial nerve deficit.  Skin: Skin is warm and dry. No rash noted. No erythema.  Psychiatric: He has a normal mood and affect. His behavior is normal.    Vital signs in last 24 hours: @VSRANGES @  Labs:   Estimated body mass index is 47.72 kg/(m^2) as calculated from the following:   Height as of 07/03/12: 5\' 8"  (1.727 m).   Weight as of 07/01/12: 142.339  kg (313 lb 12.8 oz).   Imaging Review Plain radiographs demonstrate severe degenerative joint disease of the left hip(s). The bone quality appears to be good for age and reported activity level.  Assessment/Plan:  End stage arthritis, left hip(s)  The patient history, physical examination, clinical judgement of the provider and imaging studies are consistent with end stage degenerative joint disease of the left hip(s) and total hip arthroplasty is deemed medically necessary. The treatment options including medical management, injection therapy, arthroscopy and arthroplasty were discussed at length. The risks and benefits of total hip arthroplasty were presented and reviewed. The risks due to aseptic loosening, infection, stiffness, dislocation/subluxation,  thromboembolic complications and other imponderables were discussed.  The patient acknowledged the explanation, agreed to proceed with the plan and consent was signed. Patient is being admitted for inpatient treatment for surgery, pain control, PT, OT, prophylactic antibiotics, VTE prophylaxis, progressive ambulation and ADL's and discharge planning.The patient is planning to be discharged home with home health services

## 2014-10-24 ENCOUNTER — Other Ambulatory Visit (HOSPITAL_COMMUNITY): Payer: Self-pay

## 2014-10-26 ENCOUNTER — Encounter (HOSPITAL_COMMUNITY): Payer: Self-pay

## 2014-10-26 ENCOUNTER — Other Ambulatory Visit (HOSPITAL_COMMUNITY): Payer: Self-pay

## 2014-10-26 ENCOUNTER — Ambulatory Visit (HOSPITAL_COMMUNITY)
Admission: RE | Admit: 2014-10-26 | Discharge: 2014-10-26 | Disposition: A | Discharge status: Home / Self Care | Payer: BLUE CROSS/BLUE SHIELD | Source: Ambulatory Visit | Attending: Physician Assistant | Admitting: Physician Assistant

## 2014-10-26 ENCOUNTER — Encounter (HOSPITAL_COMMUNITY)
Admission: RE | Admit: 2014-10-26 | Discharge: 2014-10-26 | Disposition: A | Discharge status: Home / Self Care | Payer: BLUE CROSS/BLUE SHIELD | Source: Ambulatory Visit | Attending: Orthopedic Surgery | Admitting: Orthopedic Surgery

## 2014-10-26 DIAGNOSIS — R0602 Shortness of breath: Secondary | ICD-10-CM | POA: Diagnosis not present

## 2014-10-26 DIAGNOSIS — Z0183 Encounter for blood typing: Secondary | ICD-10-CM | POA: Insufficient documentation

## 2014-10-26 DIAGNOSIS — M1612 Unilateral primary osteoarthritis, left hip: Secondary | ICD-10-CM | POA: Diagnosis not present

## 2014-10-26 DIAGNOSIS — Z01818 Encounter for other preprocedural examination: Secondary | ICD-10-CM | POA: Insufficient documentation

## 2014-10-26 DIAGNOSIS — Z01812 Encounter for preprocedural laboratory examination: Secondary | ICD-10-CM | POA: Diagnosis not present

## 2014-10-26 LAB — TYPE AND SCREEN
ABO/RH(D): O POS
Antibody Screen: NEGATIVE

## 2014-10-26 LAB — CBC WITH DIFFERENTIAL/PLATELET
Basophils Absolute: 0 10*3/uL (ref 0.0–0.1)
Basophils Relative: 0 % (ref 0–1)
Eosinophils Absolute: 0.1 10*3/uL (ref 0.0–0.7)
Eosinophils Relative: 2 % (ref 0–5)
HEMATOCRIT: 44.7 % (ref 39.0–52.0)
HEMOGLOBIN: 15.2 g/dL (ref 13.0–17.0)
LYMPHS ABS: 1.1 10*3/uL (ref 0.7–4.0)
Lymphocytes Relative: 18 % (ref 12–46)
MCH: 31.2 pg (ref 26.0–34.0)
MCHC: 34 g/dL (ref 30.0–36.0)
MCV: 91.8 fL (ref 78.0–100.0)
MONO ABS: 0.4 10*3/uL (ref 0.1–1.0)
MONOS PCT: 6 % (ref 3–12)
NEUTROS ABS: 4.6 10*3/uL (ref 1.7–7.7)
Neutrophils Relative %: 74 % (ref 43–77)
Platelets: 111 10*3/uL — ABNORMAL LOW (ref 150–400)
RBC: 4.87 MIL/uL (ref 4.22–5.81)
RDW: 14.2 % (ref 11.5–15.5)
WBC: 6.3 10*3/uL (ref 4.0–10.5)

## 2014-10-26 LAB — SURGICAL PCR SCREEN
MRSA, PCR: NEGATIVE
STAPHYLOCOCCUS AUREUS: NEGATIVE

## 2014-10-26 LAB — URINALYSIS, ROUTINE W REFLEX MICROSCOPIC
BILIRUBIN URINE: NEGATIVE
Glucose, UA: NEGATIVE mg/dL
Hgb urine dipstick: NEGATIVE
Ketones, ur: NEGATIVE mg/dL
Leukocytes, UA: NEGATIVE
NITRITE: NEGATIVE
PH: 7 (ref 5.0–8.0)
Protein, ur: NEGATIVE mg/dL
SPECIFIC GRAVITY, URINE: 1.021 (ref 1.005–1.030)
Urobilinogen, UA: 1 mg/dL (ref 0.0–1.0)

## 2014-10-26 LAB — COMPREHENSIVE METABOLIC PANEL
ALT: 44 U/L (ref 17–63)
AST: 40 U/L (ref 15–41)
Albumin: 3.7 g/dL (ref 3.5–5.0)
Alkaline Phosphatase: 93 U/L (ref 38–126)
Anion gap: 10 (ref 5–15)
BUN: 15 mg/dL (ref 6–20)
CO2: 28 mmol/L (ref 22–32)
Calcium: 9.1 mg/dL (ref 8.9–10.3)
Chloride: 101 mmol/L (ref 101–111)
Creatinine, Ser: 0.91 mg/dL (ref 0.61–1.24)
GFR calc Af Amer: >60 mL/min (ref >60)
GFR calc non Af Amer: >60 mL/min (ref >60)
Glucose, Bld: 142 mg/dL — ABNORMAL HIGH (ref 70–99)
Potassium: 3.8 mmol/L (ref 3.5–5.1)
SODIUM: 139 mmol/L (ref 135–145)
TOTAL PROTEIN: 6.8 g/dL (ref 6.5–8.1)
Total Bilirubin: 0.7 mg/dL (ref 0.3–1.2)

## 2014-10-26 LAB — APTT: aPTT: 32 seconds (ref 24–37)

## 2014-10-26 LAB — PROTIME-INR
INR: 1.07 (ref 0.00–1.49)
Prothrombin Time: 14 seconds (ref 11.6–15.2)

## 2014-10-26 MED ORDER — CHLORHEXIDINE GLUCONATE 4 % EX LIQD: 60.0000 mL | Freq: Once | CUTANEOUS | Status: DC

## 2014-10-26 NOTE — Pre-Procedure Instructions (Signed)
KEEDAN SAMPLE  10/26/2014   Your procedure is scheduled on:  Nov 04, 2014  Report to [facility] Admitting at 5:30 AM.  Call this number if you have problems the morning of surgery: [phone]   Remember:   Do not eat food or drink liquids after midnight.   Take these medicines the morning of surgery with A SIP OF WATER: IF NEEDED: HYDROcodone-acetaminophen (NORCO)    STOP ASPIRIN, NSAIDS (DICLOFENAC,ADVIL, ALEVE, IBUPROFEN), HERBAL SUPPLEMENTS, FISH OIL ONE WEEK PRIOR TO SURGERY   Do not wear jewelry.  Do not wear lotions, powders, or colognes. You may wear deodorant.  Men may shave face and neck.  Do not bring valuables to the hospital.  [facility] is not responsible  for any belongings or valuables.               Contacts, dentures or bridgework may not be worn into surgery.  Leave suitcase in the car. After surgery it may be brought to your room.  For patients admitted to the hospital, discharge time is determined by your    treatment team.      Special Instructions: "Inez"   Please read over the following fact sheets that you were given: Pain Booklet, Coughing and Deep Breathing, Blood Transfusion Information and Surgical Site Infection Prevention

## 2014-10-27 LAB — URINE CULTURE

## 2014-11-03 MED ORDER — SODIUM CHLORIDE 0.9 % IV SOLN: INTRAVENOUS | Status: DC

## 2014-11-03 MED ORDER — CEFAZOLIN SODIUM 10 G IJ SOLR
3.0000 g | INTRAMUSCULAR | Status: AC
  Administered 2014-11-04: 3 g via INTRAVENOUS
  Filled 2014-11-03: qty 3000

## 2014-11-03 MED ORDER — SODIUM CHLORIDE 0.9 % IV SOLN
1000.0000 mg | INTRAVENOUS | Status: AC
  Administered 2014-11-04: 1000 mg via INTRAVENOUS
  Filled 2014-11-03: qty 10

## 2014-11-04 ENCOUNTER — Inpatient Hospital Stay (HOSPITAL_COMMUNITY)
Admission: RE | Admit: 2014-11-04 | Discharge: 2014-11-06 | DRG: 470 | Disposition: A | Discharge status: Home Health | Payer: BLUE CROSS/BLUE SHIELD | Source: Ambulatory Visit | Attending: Orthopedic Surgery | Admitting: Orthopedic Surgery

## 2014-11-04 ENCOUNTER — Inpatient Hospital Stay (HOSPITAL_COMMUNITY): Payer: BLUE CROSS/BLUE SHIELD | Admitting: Anesthesiology

## 2014-11-04 ENCOUNTER — Inpatient Hospital Stay (HOSPITAL_COMMUNITY): Payer: BLUE CROSS/BLUE SHIELD | Admitting: Vascular Surgery

## 2014-11-04 ENCOUNTER — Inpatient Hospital Stay (HOSPITAL_COMMUNITY): Payer: BLUE CROSS/BLUE SHIELD

## 2014-11-04 ENCOUNTER — Encounter (HOSPITAL_COMMUNITY): Payer: Self-pay | Admitting: *Deleted

## 2014-11-04 ENCOUNTER — Encounter (HOSPITAL_COMMUNITY)
Admission: RE | Disposition: A | Discharge status: Home Health | Payer: Self-pay | Source: Ambulatory Visit | Attending: Orthopedic Surgery

## 2014-11-04 DIAGNOSIS — M1612 Unilateral primary osteoarthritis, left hip: Principal | ICD-10-CM | POA: Diagnosis present

## 2014-11-04 DIAGNOSIS — Z6841 Body Mass Index (BMI) 40.0 and over, adult: Secondary | ICD-10-CM | POA: Diagnosis not present

## 2014-11-04 DIAGNOSIS — Z87891 Personal history of nicotine dependence: Secondary | ICD-10-CM

## 2014-11-04 DIAGNOSIS — G4730 Sleep apnea, unspecified: Secondary | ICD-10-CM | POA: Diagnosis present

## 2014-11-04 DIAGNOSIS — Z96641 Presence of right artificial hip joint: Secondary | ICD-10-CM | POA: Diagnosis present

## 2014-11-04 DIAGNOSIS — I10 Essential (primary) hypertension: Secondary | ICD-10-CM | POA: Diagnosis present

## 2014-11-04 DIAGNOSIS — M25552 Pain in left hip: Secondary | ICD-10-CM | POA: Diagnosis present

## 2014-11-04 HISTORY — PX: TOTAL HIP ARTHROPLASTY: SHX124

## 2014-11-04 SURGERY — ARTHROPLASTY, HIP, TOTAL,POSTERIOR APPROACH
Anesthesia: General | Site: Hip | Laterality: Left

## 2014-11-04 MED ORDER — ONDANSETRON HCL 4 MG/2ML IJ SOLN
INTRAMUSCULAR | Status: DC | PRN
  Administered 2014-11-04: 4 mg via INTRAVENOUS

## 2014-11-04 MED ORDER — OXYCODONE HCL 5 MG PO TABS: ORAL_TABLET | ORAL | Status: DC

## 2014-11-04 MED ORDER — CEFAZOLIN SODIUM-DEXTROSE 2-3 GM-% IV SOLR
2.0000 g | Freq: Four times a day (QID) | INTRAVENOUS | Status: AC
  Administered 2014-11-04 (×2): 2 g via INTRAVENOUS
  Filled 2014-11-04 (×2): qty 50

## 2014-11-04 MED ORDER — SODIUM CHLORIDE 0.9 % IJ SOLN
INTRAMUSCULAR | Status: AC
  Filled 2014-11-04: qty 10

## 2014-11-04 MED ORDER — STERILE WATER FOR INJECTION IJ SOLN
INTRAMUSCULAR | Status: AC
  Filled 2014-11-04: qty 10

## 2014-11-04 MED ORDER — BUPIVACAINE-EPINEPHRINE (PF) 0.5% -1:200000 IJ SOLN
INTRAMUSCULAR | Status: AC
Start: 2014-11-04 — End: 2014-11-04
  Filled 2014-11-04: qty 30

## 2014-11-04 MED ORDER — ALBUMIN HUMAN 5 % IV SOLN
INTRAVENOUS | Status: DC | PRN
  Administered 2014-11-04 (×2): via INTRAVENOUS

## 2014-11-04 MED ORDER — BUPIVACAINE-EPINEPHRINE 0.5% -1:200000 IJ SOLN
INTRAMUSCULAR | Status: DC | PRN
Start: 2014-11-04 — End: 2014-11-04
  Administered 2014-11-04: 30 mL

## 2014-11-04 MED ORDER — VECURONIUM BROMIDE 10 MG IV SOLR
INTRAVENOUS | Status: AC
  Filled 2014-11-04: qty 10

## 2014-11-04 MED ORDER — BISACODYL 10 MG RE SUPP: 10.0000 mg | Freq: Every day | RECTAL | Status: DC | PRN

## 2014-11-04 MED ORDER — FLEET ENEMA 7-19 GM/118ML RE ENEM: 1.0000 | ENEMA | Freq: Once | RECTAL | Status: AC | PRN

## 2014-11-04 MED ORDER — SUCCINYLCHOLINE CHLORIDE 20 MG/ML IJ SOLN
INTRAMUSCULAR | Status: AC
  Filled 2014-11-04: qty 1

## 2014-11-04 MED ORDER — DOCUSATE SODIUM 100 MG PO CAPS
100.0000 mg | ORAL_CAPSULE | Freq: Two times a day (BID) | ORAL | Status: DC
  Administered 2014-11-04 – 2014-11-06 (×5): 100 mg via ORAL
  Filled 2014-11-04 (×5): qty 1

## 2014-11-04 MED ORDER — ROCURONIUM BROMIDE 50 MG/5ML IV SOLN
INTRAVENOUS | Status: AC
  Filled 2014-11-04: qty 1

## 2014-11-04 MED ORDER — ONDANSETRON HCL 4 MG/2ML IJ SOLN: 4.0000 mg | Freq: Four times a day (QID) | INTRAMUSCULAR | Status: DC | PRN

## 2014-11-04 MED ORDER — HYDROMORPHONE HCL 1 MG/ML IJ SOLN
INTRAMUSCULAR | Status: AC
  Administered 2014-11-04: 0.5 mg via INTRAVENOUS
  Filled 2014-11-04: qty 1

## 2014-11-04 MED ORDER — OXYCODONE HCL 5 MG PO TABS: 5.0000 mg | ORAL_TABLET | Freq: Once | ORAL | Status: DC | PRN

## 2014-11-04 MED ORDER — PROPOFOL 10 MG/ML IV BOLUS
INTRAVENOUS | Status: AC
  Filled 2014-11-04: qty 20

## 2014-11-04 MED ORDER — EPHEDRINE SULFATE 50 MG/ML IJ SOLN
INTRAMUSCULAR | Status: AC
  Filled 2014-11-04: qty 1

## 2014-11-04 MED ORDER — ACETAMINOPHEN 325 MG PO TABS
650.0000 mg | ORAL_TABLET | Freq: Four times a day (QID) | ORAL | Status: DC | PRN
  Administered 2014-11-05: 650 mg via ORAL
  Filled 2014-11-04: qty 2

## 2014-11-04 MED ORDER — HYDROMORPHONE HCL 1 MG/ML IJ SOLN: 1.0000 mg | INTRAMUSCULAR | Status: DC | PRN

## 2014-11-04 MED ORDER — NEOSTIGMINE METHYLSULFATE 10 MG/10ML IV SOLN
INTRAVENOUS | Status: DC | PRN
  Administered 2014-11-04: 5 mg via INTRAVENOUS

## 2014-11-04 MED ORDER — MIDAZOLAM HCL 5 MG/5ML IJ SOLN
INTRAMUSCULAR | Status: DC | PRN
  Administered 2014-11-04: 2 mg via INTRAVENOUS

## 2014-11-04 MED ORDER — VECURONIUM BROMIDE 10 MG IV SOLR
INTRAVENOUS | Status: DC | PRN
  Administered 2014-11-04: 4 mg via INTRAVENOUS
  Administered 2014-11-04: 2 mg via INTRAVENOUS
  Administered 2014-11-04: 4 mg via INTRAVENOUS
  Administered 2014-11-04 (×3): 2 mg via INTRAVENOUS

## 2014-11-04 MED ORDER — KETOROLAC TROMETHAMINE 30 MG/ML IJ SOLN
30.0000 mg | Freq: Once | INTRAMUSCULAR | Status: AC | PRN
  Administered 2014-11-04: 30 mg via INTRAVENOUS

## 2014-11-04 MED ORDER — FENTANYL CITRATE (PF) 100 MCG/2ML IJ SOLN
INTRAMUSCULAR | Status: DC | PRN
  Administered 2014-11-04 (×4): 50 ug via INTRAVENOUS
  Administered 2014-11-04: 100 ug via INTRAVENOUS
  Administered 2014-11-04: 50 ug via INTRAVENOUS

## 2014-11-04 MED ORDER — PROMETHAZINE HCL 25 MG/ML IJ SOLN: 6.2500 mg | INTRAMUSCULAR | Status: DC | PRN

## 2014-11-04 MED ORDER — ONDANSETRON HCL 4 MG/2ML IJ SOLN
INTRAMUSCULAR | Status: AC
  Filled 2014-11-04: qty 2

## 2014-11-04 MED ORDER — OXYCODONE HCL 5 MG PO TABS
5.0000 mg | ORAL_TABLET | ORAL | Status: DC | PRN
  Administered 2014-11-04 – 2014-11-05 (×5): 10 mg via ORAL
  Administered 2014-11-05 – 2014-11-06 (×2): 5 mg via ORAL
  Filled 2014-11-04 (×4): qty 2
  Filled 2014-11-04 (×2): qty 1
  Filled 2014-11-04: qty 2

## 2014-11-04 MED ORDER — MIDAZOLAM HCL 2 MG/2ML IJ SOLN
INTRAMUSCULAR | Status: AC
  Filled 2014-11-04: qty 2

## 2014-11-04 MED ORDER — PHENYLEPHRINE 40 MCG/ML (10ML) SYRINGE FOR IV PUSH (FOR BLOOD PRESSURE SUPPORT)
PREFILLED_SYRINGE | INTRAVENOUS | Status: AC
  Filled 2014-11-04: qty 10

## 2014-11-04 MED ORDER — LIDOCAINE HCL (CARDIAC) 20 MG/ML IV SOLN
INTRAVENOUS | Status: AC
  Filled 2014-11-04: qty 5

## 2014-11-04 MED ORDER — MENTHOL 3 MG MT LOZG: 1.0000 | LOZENGE | OROMUCOSAL | Status: DC | PRN

## 2014-11-04 MED ORDER — METOCLOPRAMIDE HCL 5 MG/ML IJ SOLN
5.0000 mg | Freq: Three times a day (TID) | INTRAMUSCULAR | Status: DC | PRN
Start: 2014-11-04 — End: 2014-11-06

## 2014-11-04 MED ORDER — IRBESARTAN 300 MG PO TABS
300.0000 mg | ORAL_TABLET | Freq: Every day | ORAL | Status: DC
  Administered 2014-11-04 – 2014-11-06 (×3): 300 mg via ORAL
  Filled 2014-11-04 (×3): qty 1

## 2014-11-04 MED ORDER — OXYCODONE HCL 5 MG PO TABS
ORAL_TABLET | ORAL | Status: DC
Start: 2014-11-04 — End: 2014-11-04
  Filled 2014-11-04: qty 2

## 2014-11-04 MED ORDER — OXYCODONE HCL 5 MG/5ML PO SOLN: 5.0000 mg | Freq: Once | ORAL | Status: DC | PRN

## 2014-11-04 MED ORDER — GLYCOPYRROLATE 0.2 MG/ML IJ SOLN
INTRAMUSCULAR | Status: DC | PRN
  Administered 2014-11-04: 0.6 mg via INTRAVENOUS

## 2014-11-04 MED ORDER — APIXABAN 2.5 MG PO TABS
2.5000 mg | ORAL_TABLET | Freq: Two times a day (BID) | ORAL | Status: DC
  Administered 2014-11-05 – 2014-11-06 (×3): 2.5 mg via ORAL
  Filled 2014-11-04 (×3): qty 1

## 2014-11-04 MED ORDER — METOCLOPRAMIDE HCL 5 MG PO TABS: 5.0000 mg | ORAL_TABLET | Freq: Three times a day (TID) | ORAL | Status: DC | PRN

## 2014-11-04 MED ORDER — SODIUM CHLORIDE 0.9 % IR SOLN
Status: DC | PRN
  Administered 2014-11-04: 1000 mL

## 2014-11-04 MED ORDER — ACETAMINOPHEN 650 MG RE SUPP: 650.0000 mg | Freq: Four times a day (QID) | RECTAL | Status: DC | PRN

## 2014-11-04 MED ORDER — METHOCARBAMOL 750 MG PO TABS: 750.0000 mg | ORAL_TABLET | Freq: Four times a day (QID) | ORAL | Status: DC

## 2014-11-04 MED ORDER — SENNOSIDES-DOCUSATE SODIUM 8.6-50 MG PO TABS: 1.0000 | ORAL_TABLET | Freq: Every evening | ORAL | Status: DC | PRN

## 2014-11-04 MED ORDER — KETOROLAC TROMETHAMINE 30 MG/ML IJ SOLN
INTRAMUSCULAR | Status: AC
  Administered 2014-11-04: 30 mg via INTRAVENOUS
  Filled 2014-11-04: qty 1

## 2014-11-04 MED ORDER — FENTANYL CITRATE (PF) 250 MCG/5ML IJ SOLN
INTRAMUSCULAR | Status: AC
  Filled 2014-11-04: qty 5

## 2014-11-04 MED ORDER — HYDROMORPHONE HCL 1 MG/ML IJ SOLN
0.2500 mg | INTRAMUSCULAR | Status: DC | PRN
  Administered 2014-11-04 (×2): 0.5 mg via INTRAVENOUS

## 2014-11-04 MED ORDER — LACTATED RINGERS IV SOLN
INTRAVENOUS | Status: DC | PRN
  Administered 2014-11-04 (×3): via INTRAVENOUS

## 2014-11-04 MED ORDER — SUCCINYLCHOLINE CHLORIDE 20 MG/ML IJ SOLN
INTRAMUSCULAR | Status: DC | PRN
  Administered 2014-11-04: 120 mg via INTRAVENOUS

## 2014-11-04 MED ORDER — APIXABAN 2.5 MG PO TABS: 2.5000 mg | ORAL_TABLET | Freq: Two times a day (BID) | ORAL | Status: DC

## 2014-11-04 MED ORDER — LIDOCAINE HCL (CARDIAC) 20 MG/ML IV SOLN
INTRAVENOUS | Status: DC | PRN
  Administered 2014-11-04: 100 mg via INTRAVENOUS

## 2014-11-04 MED ORDER — ACETAMINOPHEN 10 MG/ML IV SOLN
1000.0000 mg | INTRAVENOUS | Status: AC
  Administered 2014-11-04: 1000 mg via INTRAVENOUS
  Filled 2014-11-04: qty 100

## 2014-11-04 MED ORDER — VALSARTAN-HYDROCHLOROTHIAZIDE 320-12.5 MG PO TABS: 1.0000 | ORAL_TABLET | Freq: Every day | ORAL | Status: DC

## 2014-11-04 MED ORDER — ONDANSETRON HCL 4 MG PO TABS: 4.0000 mg | ORAL_TABLET | Freq: Four times a day (QID) | ORAL | Status: DC | PRN

## 2014-11-04 MED ORDER — PROPOFOL 10 MG/ML IV BOLUS
INTRAVENOUS | Status: DC | PRN
  Administered 2014-11-04: 200 mg via INTRAVENOUS

## 2014-11-04 MED ORDER — GLYCOPYRROLATE 0.2 MG/ML IJ SOLN
INTRAMUSCULAR | Status: AC
  Filled 2014-11-04: qty 1

## 2014-11-04 MED ORDER — SODIUM CHLORIDE 0.9 % IV SOLN
INTRAVENOUS | Status: DC
  Administered 2014-11-04: 14:00:00 via INTRAVENOUS

## 2014-11-04 MED ORDER — PHENOL 1.4 % MT LIQD: 1.0000 | OROMUCOSAL | Status: DC | PRN

## 2014-11-04 MED ORDER — HYDROCHLOROTHIAZIDE 12.5 MG PO CAPS
12.5000 mg | ORAL_CAPSULE | Freq: Every day | ORAL | Status: DC
  Administered 2014-11-04 – 2014-11-06 (×3): 12.5 mg via ORAL
  Filled 2014-11-04 (×3): qty 1

## 2014-11-04 MED ORDER — METHOCARBAMOL 750 MG PO TABS
750.0000 mg | ORAL_TABLET | Freq: Four times a day (QID) | ORAL | Status: DC
  Administered 2014-11-04 – 2014-11-06 (×8): 750 mg via ORAL
  Filled 2014-11-04 (×8): qty 1

## 2014-11-04 SURGICAL SUPPLY — 59 items
BLADE SAW SAG 73X25 THK (BLADE) ×1
BLADE SAW SGTL 73X25 THK (BLADE) ×1 IMPLANT
BRUSH FEMORAL CANAL (MISCELLANEOUS) IMPLANT
CAPT HIP TOTAL 2 ×1 IMPLANT
COVER SURGICAL LIGHT HANDLE (MISCELLANEOUS) ×2 IMPLANT
DRAPE IMP U-DRAPE 54X76 (DRAPES) ×2 IMPLANT
DRAPE INCISE IOBAN 66X45 STRL (DRAPES) IMPLANT
DRAPE ORTHO SPLIT 77X108 STRL (DRAPES) ×4
DRAPE SURG ORHT 6 SPLT 77X108 (DRAPES) ×2 IMPLANT
DRAPE U-SHAPE 47X51 STRL (DRAPES) ×2 IMPLANT
DRSG ADAPTIC 3X8 NADH LF (GAUZE/BANDAGES/DRESSINGS) ×1 IMPLANT
DRSG AQUACEL AG ADV 3.5X14 (GAUZE/BANDAGES/DRESSINGS) ×1 IMPLANT
DRSG PAD ABDOMINAL 8X10 ST (GAUZE/BANDAGES/DRESSINGS) ×4 IMPLANT
DURAPREP 26ML APPLICATOR (WOUND CARE) ×3 IMPLANT
ELECT BLADE 6.5 EXT (BLADE) IMPLANT
ELECT CAUTERY BLADE 6.4 (BLADE) ×2 IMPLANT
ELECT REM PT RETURN 9FT ADLT (ELECTROSURGICAL) ×2
ELECTRODE REM PT RTRN 9FT ADLT (ELECTROSURGICAL) ×1 IMPLANT
FACESHIELD WRAPAROUND (MASK) ×4 IMPLANT
FACESHIELD WRAPAROUND OR TEAM (MASK) ×2 IMPLANT
GAUZE SPONGE 4X4 12PLY STRL (GAUZE/BANDAGES/DRESSINGS) ×1 IMPLANT
GLOVE BIOGEL PI IND STRL 8 (GLOVE) ×2 IMPLANT
GLOVE BIOGEL PI INDICATOR 8 (GLOVE) ×2
GLOVE ORTHO TXT STRL SZ7.5 (GLOVE) ×4 IMPLANT
GLOVE SURG ORTHO 8.0 STRL STRW (GLOVE) ×4 IMPLANT
GOWN STRL REUS W/ TWL LRG LVL3 (GOWN DISPOSABLE) ×1 IMPLANT
GOWN STRL REUS W/ TWL XL LVL3 (GOWN DISPOSABLE) ×1 IMPLANT
GOWN STRL REUS W/TWL 2XL LVL3 (GOWN DISPOSABLE) ×2 IMPLANT
GOWN STRL REUS W/TWL LRG LVL3 (GOWN DISPOSABLE) ×2
GOWN STRL REUS W/TWL XL LVL3 (GOWN DISPOSABLE) ×2
HANDPIECE INTERPULSE COAX TIP (DISPOSABLE)
HOOD PEEL AWAY FACE SHEILD DIS (HOOD) ×2 IMPLANT
IMMOBILIZER KNEE 22 (SOFTGOODS) ×1 IMPLANT
IMMOBILIZER KNEE 22 UNIV (SOFTGOODS) ×2 IMPLANT
KIT BASIN OR (CUSTOM PROCEDURE TRAY) ×2 IMPLANT
KIT ROOM TURNOVER OR (KITS) ×2 IMPLANT
MANIFOLD NEPTUNE II (INSTRUMENTS) ×2 IMPLANT
NDL MAYO TROCAR (NEEDLE) IMPLANT
NEEDLE 22X1 1/2 (OR ONLY) (NEEDLE) ×2 IMPLANT
NEEDLE MAYO TROCAR (NEEDLE) IMPLANT
NS IRRIG 1000ML POUR BTL (IV SOLUTION) ×2 IMPLANT
PACK TOTAL JOINT (CUSTOM PROCEDURE TRAY) ×2 IMPLANT
PACK UNIVERSAL I (CUSTOM PROCEDURE TRAY) ×2 IMPLANT
PAD ARMBOARD 7.5X6 YLW CONV (MISCELLANEOUS) ×4 IMPLANT
PRESSURIZER FEMORAL UNIV (MISCELLANEOUS) IMPLANT
SET HNDPC FAN SPRY TIP SCT (DISPOSABLE) IMPLANT
STAPLER VISISTAT 35W (STAPLE) ×2 IMPLANT
SUCTION FRAZIER TIP 10 FR DISP (SUCTIONS) ×2 IMPLANT
SUT ETHIBOND 2 V 37 (SUTURE) ×1 IMPLANT
SUT ETHIBOND NAB CT1 #1 30IN (SUTURE) ×4 IMPLANT
SUT VIC AB 0 CT1 27 (SUTURE) ×4
SUT VIC AB 0 CT1 27XBRD ANBCTR (SUTURE) ×2 IMPLANT
SUT VIC AB 2-0 CT1 27 (SUTURE) ×4
SUT VIC AB 2-0 CT1 TAPERPNT 27 (SUTURE) ×2 IMPLANT
SYR CONTROL 10ML LL (SYRINGE) ×2 IMPLANT
TOWEL OR 17X24 6PK STRL BLUE (TOWEL DISPOSABLE) ×2 IMPLANT
TOWEL OR 17X26 10 PK STRL BLUE (TOWEL DISPOSABLE) ×2 IMPLANT
TOWER CARTRIDGE SMART MIX (DISPOSABLE) IMPLANT
WATER STERILE IRR 1000ML POUR (IV SOLUTION) ×2 IMPLANT

## 2014-11-04 NOTE — Discharge Instructions (Signed)
INSTRUCTIONS AFTER JOINT REPLACEMENT  ° °o Remove items at home which could result in a fall. This includes throw rugs or furniture in walking pathways °o ICE to the affected joint every three hours while awake for 30 minutes at a time, for at least the first 3-5 days, and then as needed for pain and swelling.  Continue to use ice for pain and swelling. You may notice swelling that will progress down to the foot and ankle.  This is normal after surgery.  Elevate your leg when you are not up walking on it.   °o Continue to use the breathing machine you got in the hospital (incentive spirometer) which will help keep your temperature down.  It is common for your temperature to cycle up and down following surgery, especially at night when you are not up moving around and exerting yourself.  The breathing machine keeps your lungs expanded and your temperature down. ° ° °DIET:  As you were doing prior to hospitalization, we recommend a well-balanced diet. ° °DRESSING / WOUND CARE / SHOWERING ° °You may change your dressing 3-5 days after surgery.  Then change the dressing every day with sterile gauze.  Please use good hand washing techniques before changing the dressing.  Do not use any lotions or creams on the incision until instructed by your surgeon. ° °ACTIVITY ° °o Increase activity slowly as tolerated, but follow the weight bearing instructions below.   °o No driving for 6 weeks or until further direction given by your physician.  You cannot drive while taking narcotics.  °o No lifting or carrying greater than 10 lbs. until further directed by your surgeon. °o Avoid periods of inactivity such as sitting longer than an hour when not asleep. This helps prevent blood clots.  °o You may return to work once you are authorized by your doctor.  ° ° ° °WEIGHT BEARING  ° °Weight bearing as tolerated with assist device (walker, cane, etc) as directed, use it as long as suggested by your surgeon or therapist, typically at  least 4-6 weeks. ° ° °EXERCISES ° °Results after joint replacement surgery are often greatly improved when you follow the exercise, range of motion and muscle strengthening exercises prescribed by your doctor. Safety measures are also important to protect the joint from further injury. Any time any of these exercises cause you to have increased pain or swelling, decrease what you are doing until you are comfortable again and then slowly increase them. If you have problems or questions, call your caregiver or physical therapist for advice.  ° °Rehabilitation is important following a joint replacement. After just a few days of immobilization, the muscles of the leg can become weakened and shrink (atrophy).  These exercises are designed to build up the tone and strength of the thigh and leg muscles and to improve motion. Often times heat used for twenty to thirty minutes before working out will loosen up your tissues and help with improving the range of motion but do not use heat for the first two weeks following surgery (sometimes heat can increase post-operative swelling).  ° °These exercises can be done on a training (exercise) mat, on the floor, on a table or on a bed. Use whatever works the best and is most comfortable for you.    Use music or television while you are exercising so that the exercises are a pleasant break in your day. This will make your life better with the exercises acting as a break   in your routine that you can look forward to.   Perform all exercises about fifteen times, three times per day or as directed.  You should exercise both the operative leg and the other leg as well. ° °Exercises include: °  °• Quad Sets - Tighten up the muscle on the front of the thigh (Quad) and hold for 5-10 seconds.   °• Straight Leg Raises - With your knee straight (if you were given a brace, keep it on), lift the leg to 60 degrees, hold for 3 seconds, and slowly lower the leg.  Perform this exercise against  resistance later as your leg gets stronger.  °• Leg Slides: Lying on your back, slowly slide your foot toward your buttocks, bending your knee up off the floor (only go as far as is comfortable). Then slowly slide your foot back down until your leg is flat on the floor again.  °• Angel Wings: Lying on your back spread your legs to the side as far apart as you can without causing discomfort.  °• Hamstring Strength:  Lying on your back, push your heel against the floor with your leg straight by tightening up the muscles of your buttocks.  Repeat, but this time bend your knee to a comfortable angle, and push your heel against the floor.  You may put a pillow under the heel to make it more comfortable if necessary.  ° °A rehabilitation program following joint replacement surgery can speed recovery and prevent re-injury in the future due to weakened muscles. Contact your doctor or a physical therapist for more information on knee rehabilitation.  ° ° °CONSTIPATION ° °Constipation is defined medically as fewer than three stools per week and severe constipation as less than one stool per week.  Even if you have a regular bowel pattern at home, your normal regimen is likely to be disrupted due to multiple reasons following surgery.  Combination of anesthesia, postoperative narcotics, change in appetite and fluid intake all can affect your bowels.  ° °YOU MUST use at least one of the following options; they are listed in order of increasing strength to get the job done.  They are all available over the counter, and you may need to use some, POSSIBLY even all of these options:   ° °Drink plenty of fluids (prune juice may be helpful) and high fiber foods °Colace 100 mg by mouth twice a day  °Senokot for constipation as directed and as needed Dulcolax (bisacodyl), take with full glass of water  °Miralax (polyethylene glycol) once or twice a day as needed. ° °If you have tried all these things and are unable to have a bowel  movement in the first 3-4 days after surgery call either your surgeon or your primary doctor.   ° °If you experience loose stools or diarrhea, hold the medications until you stool forms back up.  If your symptoms do not get better within 1 week or if they get worse, check with your doctor.  If you experience "the worst abdominal pain ever" or develop nausea or vomiting, please contact the office immediately for further recommendations for treatment. ° ° °ITCHING:  If you experience itching with your medications, try taking only a single pain pill, or even half a pain pill at a time.  You can also use Benadryl over the counter for itching or also to help with sleep.  ° °TED HOSE STOCKINGS:  Use stockings on both legs until for at least 2 weeks or as   directed by physician office. They may be removed at night for sleeping. ° °MEDICATIONS:  See your medication summary on the “After Visit Summary” that nursing will review with you.  You may have some home medications which will be placed on hold until you complete the course of blood thinner medication.  It is important for you to complete the blood thinner medication as prescribed. ° °PRECAUTIONS:  If you experience chest pain or shortness of breath - call 911 immediately for transfer to the hospital emergency department.  ° °If you develop a fever greater that 101 F, purulent drainage from wound, increased redness or drainage from wound, foul odor from the wound/dressing, or calf pain - CONTACT YOUR SURGEON.   °                                                °FOLLOW-UP APPOINTMENTS:  If you do not already have a post-op appointment, please call the office for an appointment to be seen by your surgeon.  Guidelines for how soon to be seen are listed in your “After Visit Summary”, but are typically between 1-4 weeks after surgery. ° °OTHER INSTRUCTIONS:  ° °Knee Replacement:  Do not place pillow under knee, focus on keeping the knee straight while resting. CPM  instructions: 0-90 degrees, 2 hours in the morning, 2 hours in the afternoon, and 2 hours in the evening. Place foam block, curve side up under heel at all times except when in CPM or when walking.  DO NOT modify, tear, cut, or change the foam block in any way. ° °MAKE SURE YOU:  °• Understand these instructions.  °• Get help right away if you are not doing well or get worse.  ° ° °Thank you for letting us be a part of your medical care team.  It is a privilege we respect greatly.  We hope these instructions will help you stay on track for a fast and full recovery!  ° ° ° °Information on my medicine - ELIQUIS® (apixaban) ° °This medication education was reviewed with me or my healthcare representative as part of my discharge preparation.  The pharmacist that spoke with me during my hospital stay was:  Shonica Weier P, RPH ° °Why was Eliquis® prescribed for you? °Eliquis® was prescribed for you to reduce the risk of blood clots forming after orthopedic surgery.   ° °What do You need to know about Eliquis®? °Take your Eliquis® TWICE DAILY - one tablet in the morning and one tablet in the evening with or without food.  It would be best to take the dose about the same time each day. ° °If you have difficulty swallowing the tablet whole please discuss with your pharmacist how to take the medication safely. ° °Take Eliquis® exactly as prescribed by your doctor and DO NOT stop taking Eliquis® without talking to the doctor who prescribed the medication.  Stopping without other medication to take the place of Eliquis® may increase your risk of developing a clot. ° °After discharge, you should have regular check-up appointments with your healthcare provider that is prescribing your Eliquis®. ° °What do you do if you miss a dose? °If a dose of ELIQUIS® is not taken at the scheduled time, take it as soon as possible on the same day and twice-daily administration should be resumed.  The dose should not   be doubled to make up for  a missed dose.  Do not take more than one tablet of ELIQUIS at the same time. ° °Important Safety Information °A possible side effect of Eliquis® is bleeding. You should call your healthcare provider right away if you experience any of the following: °? Bleeding from an injury or your nose that does not stop. °? Unusual colored urine (red or dark brown) or unusual colored stools (red or black). °? Unusual bruising for unknown reasons. °? A serious fall or if you hit your head (even if there is no bleeding). ° °Some medicines may interact with Eliquis® and might increase your risk of bleeding or clotting while on Eliquis®. To help avoid this, consult your healthcare provider or pharmacist prior to using any new prescription or non-prescription medications, including herbals, vitamins, non-steroidal anti-inflammatory drugs (NSAIDs) and supplements. ° °This website has more information on Eliquis® (apixaban): http://www.eliquis.com/eliquis/home ° °

## 2014-11-04 NOTE — Anesthesia Postprocedure Evaluation (Signed)
  Anesthesia Post-op Note  Patient: Timothy Barton  Procedure(s) Performed: Procedure(s): TOTAL HIP ARTHROPLASTY (Left)  Patient Location: PACU  Anesthesia Type:General  Level of Consciousness: awake and alert   Airway and Oxygen Therapy: Patient Spontanous Breathing  Post-op Pain: none  Post-op Assessment: Post-op Vital signs reviewed  Post-op Vital Signs: Reviewed  Last Vitals:  Filed Vitals:   11/04/14 1339  BP: 104/67  Pulse: 96  Temp: 36.8 C  Resp: 20    Complications: No apparent anesthesia complications

## 2014-11-04 NOTE — Interval H&P Note (Signed)
History and Physical Interval Note:  11/04/2014 7:27 AM  Timothy Barton  has presented today for surgery, with the diagnosis of OA LEFT HIP  The various methods of treatment have been discussed with the patient and family. After consideration of risks, benefits and other options for treatment, the patient has consented to  Procedure(s): TOTAL HIP ARTHROPLASTY (Left) as a surgical intervention .  The patient's history has been reviewed, patient examined, no change in status, stable for surgery.  I have reviewed the patient's chart and labs.  Questions were answered to the patient's satisfaction.     Vir Whetstine JR,W D

## 2014-11-04 NOTE — Progress Notes (Signed)
assessment done per consult to RT order

## 2014-11-04 NOTE — Anesthesia Procedure Notes (Signed)
Procedure Name: Intubation Date/Time: 11/04/2014 7:46 AM Performed by: Jenne Campus Pre-anesthesia Checklist: Patient identified, Emergency Drugs available, Suction available, Patient being monitored and Timeout performed Patient Re-evaluated:Patient Re-evaluated prior to inductionOxygen Delivery Method: Circle system utilized Preoxygenation: Pre-oxygenation with 100% oxygen Intubation Type: IV induction Ventilation: Mask ventilation without difficulty and Oral airway inserted - appropriate to patient size Laryngoscope Size: Mac, 4 and Glidescope Grade View: Grade IV Tube type: Oral Tube size: 7.5 mm Number of attempts: 1 Airway Equipment and Method: Rigid stylet and Video-laryngoscopy Placement Confirmation: positive ETCO2,  CO2 detector and breath sounds checked- equal and bilateral Secured at: 22 cm Tube secured with: Tape Dental Injury: Teeth and Oropharynx as per pre-operative assessment  Difficulty Due To: Difficulty was anticipated and Difficult Airway- due to large tongue Future Recommendations: Recommend- induction with short-acting agent, and alternative techniques readily available

## 2014-11-04 NOTE — Anesthesia Preprocedure Evaluation (Addendum)
Anesthesia Evaluation  Patient identified by MRN, date of birth, ID band Patient awake    Reviewed: Allergy & Precautions, NPO status , Patient's Chart, lab work & pertinent test results  Airway Mallampati: III  TM Distance: >3 FB Neck ROM: Full    Dental  (+) Teeth Intact, Dental Advisory Given   Pulmonary sleep apnea and Continuous Positive Airway Pressure Ventilation , former smoker,  breath sounds clear to auscultation  Pulmonary exam normal       Cardiovascular hypertension, Pt. on medications Rhythm:Regular Rate:Normal     Neuro/Psych negative neurological ROS  negative psych ROS   GI/Hepatic negative GI ROS, Neg liver ROS,   Endo/Other  Morbid obesity  Renal/GU negative Renal ROS     Musculoskeletal   Abdominal   Peds  Hematology Plts 111   Anesthesia Other Findings   Reproductive/Obstetrics negative OB ROS                           Anesthesia Physical Anesthesia Plan  ASA: III  Anesthesia Plan: General   Post-op Pain Management:    Induction: Intravenous  Airway Management Planned: Oral ETT  Additional Equipment:   Intra-op Plan:   Post-operative Plan: Extubation in OR  Informed Consent: I have reviewed the patients History and Physical, chart, labs and discussed the procedure including the risks, benefits and alternatives for the proposed anesthesia with the patient or authorized representative who has indicated his/her understanding and acceptance.   Dental advisory given  Plan Discussed with: CRNA  Anesthesia Plan Comments:         Anesthesia Quick Evaluation

## 2014-11-04 NOTE — H&P (View-Only) (Signed)
TOTAL HIP ADMISSION H&P  Patient is admitted for left total hip arthroplasty.  Subjective:  Chief Complaint: left hip pain  HPI: Timothy Barton, 64 y.o. male, has a history of pain and functional disability in the left hip(s) due to arthritis and patient has failed non-surgical conservative treatments for greater than 12 weeks to include NSAID's and/or analgesics, corticosteriod injections, use of assistive devices and activity modification.  Onset of symptoms was gradual starting >10 years ago with gradually worsening course since that time.The patient noted no past surgery on the left hip(s).  Patient currently rates pain in the left hip at 8 out of 10 with activity. Patient has night pain, worsening of pain with activity and weight bearing, trendelenberg gait, pain that interfers with activities of daily living and pain with passive range of motion. Patient has evidence of periarticular osteophytes and joint space narrowing by imaging studies. This condition presents safety issues increasing the risk of falls. There is no current active infection.  Patient Active Problem List   Diagnosis Date Noted  . Abnormality of gait 07/27/2012  . Hip weakness 07/27/2012  . Contact dermatitis 07/07/2012  . Osteoarthritis of right hip 07/05/2012   Past Medical History  Diagnosis Date  . Hypertension   . Sleep apnea   . Arthritis   . Osteoarthritis of right hip 07/05/2012    Past Surgical History  Procedure Laterality Date  . Carpal tunnel release  2002    right and left  . Arthrosopy    . Knee arthroscopy  2006    right  . Back surgery  2006     ruptured disc,and repair of leak x3  . Total hip arthroplasty  07/03/2012    Procedure: TOTAL HIP ARTHROPLASTY;  Surgeon: Yvette Rack., MD;  Location: [facility];  Service: Orthopedics;  Laterality: Right;  with autograft     (Not in a hospital admission) No Known Allergies  History  Substance Use Topics  . Smoking status: Former Smoker -- 1.50  packs/day for 21 years    Quit date: 07/01/1998  . Smokeless tobacco: Former Systems developer    Types: [place] date: 07/01/1982  . Alcohol Use: No    No family history on file.   Review of Systems  Constitutional: Negative.   HENT: Positive for tinnitus. Negative for congestion, ear discharge, ear pain, hearing loss, nosebleeds and sore throat.   Eyes: Negative.   Respiratory: Negative.  Negative for stridor.   Cardiovascular: Negative.   Gastrointestinal: Negative.   Genitourinary: Negative.   Musculoskeletal: Positive for myalgias and joint pain.  Skin: Negative.   Neurological: Negative.  Negative for headaches.  Endo/Heme/Allergies: Negative.   Psychiatric/Behavioral: Negative.     Objective:  Physical Exam  Constitutional: He is oriented to person, place, and time. He appears well-developed and well-nourished. No distress.  HENT:  Head: Normocephalic and atraumatic.  Nose: Nose normal.  Eyes: Conjunctivae and EOM are normal. Pupils are equal, round, and reactive to light.  Neck: Normal range of motion.  Cardiovascular: Normal rate, regular rhythm, normal heart sounds and intact distal pulses.   Respiratory: Effort normal and breath sounds normal. No respiratory distress. He has no wheezes.  GI: Soft. Bowel sounds are normal. He exhibits no distension. There is no tenderness.  Musculoskeletal:       Left hip: He exhibits decreased range of motion, decreased strength, tenderness and bony tenderness.  Lymphadenopathy:    He has no cervical adenopathy.  Neurological: He  is alert and oriented to person, place, and time. No cranial nerve deficit.  Skin: Skin is warm and dry. No rash noted. No erythema.  Psychiatric: He has a normal mood and affect. His behavior is normal.    Vital signs in last 24 hours: @VSRANGES @  Labs:   Estimated body mass index is 47.72 kg/(m^2) as calculated from the following:   Height as of 07/03/12: 5\' 8"  (1.727 m).   Weight as of 07/01/12: 142.339  kg (313 lb 12.8 oz).   Imaging Review Plain radiographs demonstrate severe degenerative joint disease of the left hip(s). The bone quality appears to be good for age and reported activity level.  Assessment/Plan:  End stage arthritis, left hip(s)  The patient history, physical examination, clinical judgement of the provider and imaging studies are consistent with end stage degenerative joint disease of the left hip(s) and total hip arthroplasty is deemed medically necessary. The treatment options including medical management, injection therapy, arthroscopy and arthroplasty were discussed at length. The risks and benefits of total hip arthroplasty were presented and reviewed. The risks due to aseptic loosening, infection, stiffness, dislocation/subluxation,  thromboembolic complications and other imponderables were discussed.  The patient acknowledged the explanation, agreed to proceed with the plan and consent was signed. Patient is being admitted for inpatient treatment for surgery, pain control, PT, OT, prophylactic antibiotics, VTE prophylaxis, progressive ambulation and ADL's and discharge planning.The patient is planning to be discharged home with home health services

## 2014-11-04 NOTE — Transfer of Care (Signed)
Immediate Anesthesia Transfer of Care Note  Patient: Timothy Barton  Procedure(s) Performed: Procedure(s): TOTAL HIP ARTHROPLASTY (Left)  Patient Location: PACU  Anesthesia Type:General  Level of Consciousness: awake, oriented and patient cooperative  Airway & Oxygen Therapy: Patient Spontanous Breathing and Patient connected to face mask oxygen  Post-op Assessment: Report given to RN and Post -op Vital signs reviewed and stable  Post vital signs: Reviewed  Last Vitals:  Filed Vitals:   11/04/14 0551  BP: 145/79  Pulse: 84  Temp: 36.8 C  Resp: 20    Complications: No apparent anesthesia complications

## 2014-11-04 NOTE — Brief Op Note (Signed)
11/04/2014  11:09 AM  PATIENT:  Timothy Barton  64 y.o. male  PRE-OPERATIVE DIAGNOSIS:  OA LEFT HIP  POST-OPERATIVE DIAGNOSIS:  osteoarthritis left hip   PROCEDURE:  Procedure(s): TOTAL HIP ARTHROPLASTY (Left)  SURGEON:  Surgeon(s) and Role:    * Earlie Server, MD - Primary  PHYSICIAN ASSISTANT: Chriss Czar, PA-C  ASSISTANTS:   ANESTHESIA:   local and general  EBL:  Total I/O In: 3000 [I.V.:2500; IV Piggyback:500] Out: 1000 [Urine:200; Blood:800]  BLOOD ADMINISTERED:none  DRAINS: none   LOCAL MEDICATIONS USED:  MARCAINE     SPECIMEN:  No Specimen  DISPOSITION OF SPECIMEN:  N/A  COUNTS:  YES  TOURNIQUET:  * No tourniquets in log *  DICTATION: .Other Dictation: Dictation Number unknown  PLAN OF CARE: Admit to inpatient   PATIENT DISPOSITION:  PACU - hemodynamically stable.   Delay start of Pharmacological VTE agent (>24hrs) due to surgical blood loss or risk of bleeding: yes

## 2014-11-04 NOTE — Evaluation (Signed)
Physical Therapy Evaluation Patient Details Name: Timothy Barton MRN: [id number] DOB: [birth year] Today's Date: 11/04/2014   History of Present Illness  Pt is a 64 y/o M s/p L THA, post precuations.  Pt's PMH includes R THA ~2 years ago, HTN, B carpal tunnel release, R knee arthroscopy, and back surgery.  Clinical Impression  Pt is s/p L THA resulting in the deficits listed below (see PT Problem List). Pt w/ poor adherence to post hip precautions during supine>sit and will require further education on how to adhere to precautions during mobility.  Pt ambulated 5 ft to recliner chair this session, limited by pain in L hip.  Pt will benefit from skilled PT to increase their independence and safety with mobility to allow discharge to the venue listed below.      Follow Up Recommendations Home health PT;Supervision - Intermittent    Equipment Recommendations  None recommended by PT    Recommendations for Other Services OT consult     Precautions / Restrictions Precautions Precautions: Posterior Hip;Fall Precaution Booklet Issued: Yes (comment) Precaution Comments: Reviewed 3/3 precautions Required Braces or Orthoses: Knee Immobilizer - Left Knee Immobilizer - Left: Other (comment) (Pt wearing L KI upon arrival, no specific order) Restrictions Weight Bearing Restrictions: Yes LLE Weight Bearing: Weight bearing as tolerated      Mobility  Bed Mobility Overal bed mobility: Needs Assistance Bed Mobility: Supine to Sit     Supine to sit: Min assist     General bed mobility comments: Min assist w/ managing LLE to sitting EOB.  Pt w/ max use of bed rails, increased time, and cues for sequencing.  Transfers Overall transfer level: Needs assistance Equipment used: Rolling walker (2 wheeled) Transfers: Sit to/from Stand Sit to Stand: Min assist;From elevated surface         General transfer comment: Multiple attempts to perform sit>stand w/ max cues to push from bed rather than  RW.  Pt's bed was raised 2/2 pt's difficulty performing sit>stand.  Ambulation/Gait Ambulation/Gait assistance: Min guard Ambulation Distance (Feet): 5 Feet Assistive device: Rolling walker (2 wheeled) Gait Pattern/deviations: Step-to pattern;Decreased stride length;Decreased stance time - left;Decreased weight shift to left;Antalgic;Trunk flexed   Gait velocity interpretation: Below normal speed for age/gender General Gait Details: step to, dec WB on LLE, cues for managing RW  Stairs            Wheelchair Mobility    Modified Rankin (Stroke Patients Only)       Balance Overall balance assessment: Needs assistance Sitting-balance support: Bilateral upper extremity supported;Feet supported Sitting balance-Leahy Scale: Poor (pt w/ LOB posteriorly if not holding onto bed rail)   Postural control: Posterior lean Standing balance support: Bilateral upper extremity supported;During functional activity Standing balance-Leahy Scale: Fair                               Pertinent Vitals/Pain Pain Assessment: 0-10 Pain Score: 7  Pain Location: L hip w/ mobility Pain Descriptors / Indicators: Pounding;Grimacing;Guarding;Heaviness;Aching Pain Intervention(s): Limited activity within patient's tolerance;Monitored during session;Repositioned    Home Living Family/patient expects to be discharged to:: Private residence Living Arrangements: Spouse/significant other;Children (2 sons age 80) Available Help at Discharge: Family;Available PRN/intermittently;Friend(s);Neighbor Type of Home: Mobile home Home Access: Stairs to enter Entrance Stairs-Rails: Can reach both;Right;Left Entrance Stairs-Number of Steps: 6 Home Layout: One level Home Equipment: [place] - 2 wheels;Bedside commode      Prior Function Level of Independence: Independent  Hand Dominance   Dominant Hand: Right    Extremity/Trunk Assessment   Upper Extremity Assessment: Defer to  OT evaluation           Lower Extremity Assessment: LLE deficits/detail   LLE Deficits / Details: weakness and limited ROM as expected s/p L THA     Communication   Communication: No difficulties  Cognition Arousal/Alertness: Awake/alert Behavior During Therapy: WFL for tasks assessed/performed Overall Cognitive Status: Within Functional Limits for tasks assessed                      General Comments General comments (skin integrity, edema, etc.): Pt slightly impulsive and has a disregard and dec awareness of hip precautions during bed mobility.  Discussed w/ pt how precautions will be followed during functional acitivity, but pt will need reinforcement and verbal cues for adhering to post precautions during mobility.    Exercises Total Joint Exercises Ankle Circles/Pumps: AROM;Both;20 reps;Supine Quad Sets: AROM;Both;10 reps;Supine Hip ABduction/ADduction: AROM;Left;10 reps;Supine      Assessment/Plan    PT Assessment Patient needs continued PT services  PT Diagnosis Difficulty walking;Abnormality of gait;Generalized weakness;Acute pain   PT Problem List Decreased strength;Decreased range of motion;Decreased activity tolerance;Decreased mobility;Decreased balance;Decreased coordination;Decreased knowledge of use of DME;Decreased safety awareness;Decreased knowledge of precautions;Obesity;Decreased skin integrity;Pain  PT Treatment Interventions DME instruction;Gait training;Stair training;Functional mobility training;Therapeutic activities;Therapeutic exercise;Balance training;Neuromuscular re-education;Patient/family education;Modalities   PT Goals (Current goals can be found in the Care Plan section) Acute Rehab PT Goals Patient Stated Goal: none stated PT Goal Formulation: With patient Time For Goal Achievement: 11/11/14 Potential to Achieve Goals: Good    Frequency 7X/week   Barriers to discharge Inaccessible home environment;Decreased caregiver  support Intermittent assist at home; 6 steps to enter home    Co-evaluation               End of Session Equipment Utilized During Treatment: Gait belt;Left knee immobilizer Activity Tolerance: Patient limited by pain Patient left: in chair;with call bell/phone within reach Nurse Communication: Mobility status;Precautions;Weight bearing status         Time: 1219-7588 PT Time Calculation (min) (ACUTE ONLY): 25 min   Charges:   PT Evaluation $Initial PT Evaluation Tier I: 1 Procedure PT Treatments $Therapeutic Exercise: 8-22 mins   PT G Codes:       Joslyn Hy PT, DPT [phone] Pager: [phone] 11/04/2014, 4:57 PM

## 2014-11-05 LAB — BASIC METABOLIC PANEL
ANION GAP: 7 (ref 5–15)
BUN: 18 mg/dL (ref 6–20)
CHLORIDE: 98 mmol/L — AB (ref 101–111)
CO2: 29 mmol/L (ref 22–32)
CREATININE: 1.04 mg/dL (ref 0.61–1.24)
Calcium: 8.4 mg/dL — ABNORMAL LOW (ref 8.9–10.3)
GFR calc non Af Amer: >60 mL/min (ref >60)
GLUCOSE: 137 mg/dL — AB (ref 65–99)
POTASSIUM: 4.1 mmol/L (ref 3.5–5.1)
SODIUM: 134 mmol/L — AB (ref 135–145)

## 2014-11-05 LAB — CBC
HCT: 37.1 % — ABNORMAL LOW (ref 39.0–52.0)
Hemoglobin: 12.1 g/dL — ABNORMAL LOW (ref 13.0–17.0)
MCH: 29.8 pg (ref 26.0–34.0)
MCHC: 32.6 g/dL (ref 30.0–36.0)
MCV: 91.4 fL (ref 78.0–100.0)
Platelets: 116 10*3/uL — ABNORMAL LOW (ref 150–400)
RBC: 4.06 MIL/uL — ABNORMAL LOW (ref 4.22–5.81)
RDW: 14.3 % (ref 11.5–15.5)
WBC: 9.4 10*3/uL (ref 4.0–10.5)

## 2014-11-05 NOTE — Care Management Note (Signed)
Case Management Note  Patient Details  Name: TABIAS SWAYZE MRN: [id number] Date of Birth: 12/02/1950  Subjective/Objective:    64 yo M underwent L THA.                Action/Plan:  PT is recommending HHPT and intermittent supervision. No DME.   Expected Discharge Date: 11/06/14                 Expected Discharge Plan:  [place]  In-House Referral:     Discharge planning Services  CM Consult  Post Acute Care Choice:    Choice offered to:     DME Arranged:    DME Agency:     HH Arranged:  PT [place]:  [place]  Status of Service:  Completed, signed off  Medicare Important Message Given:    Date Medicare IM Given:    Medicare IM give by:    Date Additional Medicare IM Given:    Additional Medicare Important Message give by:     If discussed at [place] of Stay Meetings, dates discussed:    Additional Comments: Met with pt and wife. Discharge plan is to return home with the support of his wife. He used [facility] in the past and he wants to use them again. Referral made to [facility] by physician's office prior to surgery.  Norina Buzzard, RN 11/05/2014, 10:21 AM

## 2014-11-05 NOTE — Progress Notes (Signed)
Occupational Therapy Evaluation Patient Details Name: Timothy Barton MRN: [id number] DOB: [birth year] Today's Date: 11/05/2014    History of Present Illness Pt is a 64 y/o M s/p L THA, post precuations.  Pt's PMH includes R THA ~2 years ago, HTN, B carpal tunnel release, R knee arthroscopy, and back surgery.   Clinical Impression   Patient independent PTA. Patient currently functioning at an overall supervision>min guard assist level. Patient will benefit from acute OT to increase overall independence in the areas of ADLs, functional mobility, and overall safety in order to safely discharge home with wife and family.     Follow Up Recommendations  No OT follow up;Supervision - Intermittent    Equipment Recommendations  None recommended by OT    Recommendations for Other Services  None at this time   Precautions / Restrictions Precautions Precautions: Posterior Hip;Fall Precaution Booklet Issued: Yes (comment) Precaution Comments: Reviewed 3/3 precautions, patient able to verbalize independently  Required Braces or Orthoses: Knee Immobilizer - Left Knee Immobilizer - Left: Other (comment) (not specified in order) Restrictions Weight Bearing Restrictions: Yes LLE Weight Bearing: Weight bearing as tolerated      Mobility Bed Mobility General bed mobility comments: OOB in chair upon arrival  Transfers Overall transfer level: Needs assistance Equipment used: Rolling walker (2 wheeled) Transfers: Sit to/from Stand Sit to Stand: Min guard General transfer comment: Cues for hand placement and LLE management/placement    Balance Overall balance assessment: Needs assistance Sitting-balance support: No upper extremity supported;Feet supported Sitting balance-Leahy Scale: Fair     Standing balance support: Bilateral upper extremity supported;During functional activity Standing balance-Leahy Scale: Fair    ADL Overall ADL's : Needs assistance/impaired General ADL Comments:  Patient unable to bath/dress LEs due to hip precautions. Introduced and had patient demonstrate use of reacher and sock aid to don/doff socks. Pt reports he has AE for previous hip surgery. Patient engaged in functional ambulation around room using RW for toilet transfer (with BSC over toilet seat) and multiple sit<>stand transfers. Currently, patient stated he's been using the [facility] beside the toilet in the BR, recommend patient use BSC over toilet seat, pt agreed.     Pertinent Vitals/Pain Pain Assessment: Faces Pain Score: 6  Faces Pain Scale: Hurts even more Pain Location: left hip during mobility Pain Descriptors / Indicators: Grimacing Pain Intervention(s): Monitored during session;Limited activity within patient's tolerance     Hand Dominance Right   Extremity/Trunk Assessment Upper Extremity Assessment Upper Extremity Assessment: Overall WFL for tasks assessed   Lower Extremity Assessment Lower Extremity Assessment: Defer to PT evaluation   Cervical / Trunk Assessment Cervical / Trunk Assessment: Normal   Communication Communication Communication: No difficulties   Cognition Arousal/Alertness: Awake/alert Behavior During Therapy: WFL for tasks assessed/performed Overall Cognitive Status: Within Functional Limits for tasks assessed              Home Living Family/patient expects to be discharged to:: Private residence Living Arrangements: Spouse/significant other;Children (and two 71 yo sons) Available Help at Discharge: Family;Available PRN/intermittently;Friend(s);Neighbor Type of Home: Mobile home Home Access: Stairs to enter [employer] of Steps: 6 Entrance Stairs-Rails: Can reach both;Right;Left Home Layout: One level     Bathroom Shower/Tub: Walk-in shower;Door   [employer] Toilet: Standard     Home Equipment: Environmental consultant - 2 wheels;Bedside commode;Adaptive equipment Adaptive Equipment: Reacher;Long-handled shoe horn;Long-handled sponge;Sock aid    Prior Functioning/Environment Level of Independence: Independent      OT Diagnosis: Generalized weakness;Acute pain   OT Problem List: Decreased  strength;Decreased range of motion;Decreased activity tolerance;Impaired balance (sitting and/or standing);Decreased safety awareness;Decreased knowledge of use of DME or AE;Decreased knowledge of precautions;Pain;Obesity   OT Treatment/Interventions: Self-care/ADL training;Therapeutic exercise;Energy conservation;DME and/or AE instruction;Therapeutic activities;Patient/family education;Balance training    OT Goals(Current goals can be found in the care plan section) Acute Rehab OT Goals Patient Stated Goal: go home tomorrow OT Goal Formulation: With patient Time For Goal Achievement: 11/12/14 Potential to Achieve Goals: Good ADL Goals Pt Will Perform Grooming: with modified independence;standing Pt Will Perform Lower Body Bathing: with modified independence;sit to/from stand;with adaptive equipment Pt Will Perform Lower Body Dressing: with modified independence;sit to/from stand;with adaptive equipment Pt Will Transfer to Toilet: with modified independence;ambulating;bedside commode Pt Will Perform Tub/Shower Transfer: Shower transfer;ambulating;rolling walker;shower seat;with modified independence  OT Frequency: Min 2X/week   Barriers to D/C: None known at this time   End of Session Equipment Utilized During Treatment: Rolling walker;Left knee immobilizer  Activity Tolerance: Patient tolerated treatment well Patient left: in chair;with call bell/phone within reach;with family/visitor present   Time: 2800-3491 OT Time Calculation (min): 26 min Charges:  OT General Charges $OT Visit: 1 Procedure OT Evaluation $Initial OT Evaluation Tier I: 1 Procedure OT Treatments $Self Care/Home Management : 8-22 mins  Natallia Stellmach , MS, OTR/L, CLT Pager: [phone]  11/05/2014, 1:43 PM

## 2014-11-05 NOTE — Op Note (Signed)
NAMEMarland Barton  LYDON, VANSICKLE NO.:  [id number]  MEDICAL RECORD NO.:  [id number]  LOCATION:  [id number]                        FACILITY:  [facility]  PHYSICIAN:  Lockie Pares, M.D.    DATE OF BIRTH:  [birth year]  DATE OF PROCEDURE:  11/04/2014 DATE OF DISCHARGE:                              OPERATIVE REPORT   INDICATIONS:  A 64 year old with sleep apnea and morbid obesity with a BMI of approximately 50 __________ difficulty of case significantly increased by the patient's BMI.  We had no complications during the case, however.  PREOPERATIVE DIAGNOSIS: 1. Severe osteoarthritis, left hip. 2. Morbid obesity.  POSTOPERATIVE DIAGNOSIS: 1. Severe osteoarthritis, left hip. 2. Morbid obesity.  OPERATION:  Left total hip replacement (AML 15 mm small stature stem with +5 mm neck length, ceramic head 36 mm hip ball, 50 mm acetabular cup with 10-degree lip liner, [place] poly).  SURGEON:  Lockie Pares, M.D.  ASSISTANT:  Chriss Czar, PA.  ANESTHESIA:  General.  BLOOD LOSS:  Approximately 400.  DESCRIPTION OF PROCEDURE:  He was sterilely prepped and draped in the lateral position, extended posterior approach to the hip was made due to the patient's size.  We extended the incision moderately distally on the shaft of the femur and released a portion of the gluteus maximus insertion for better exposure and movement of the hip.  Approximately, we split the iliotibial band, gluteus maximus fascia.  Short external rotators of the hip were split, performed a T capsulotomy bearing a severely deformed with some acetabular erosion posteriorly and certainly severe arthritis of the hip was noted.  Tear was cut about 1 fingerbreadth above the lesser trochanter.  Due to his relatively young age and high BMI, had extremely compact cancellous bone, however, we found the canal without too much difficulty and reamed up to a 14.5 and due to the patient's bone density, I elected to hand ream to  line to line on this patient.  We then placed a broach.  The broaches were sized up.  We noted on the opposite side which was done several years ago, while the small statured stem was certainly more appropriate due to the anatomy, the patient had more ability to ream distally and the proximal anatomy was somewhat small, so the prosthesis was probably left 2 mm __________ for this reason, but it was thought to be reasonable and it was the same as the opposite side.  Attention was next directed to acetabulum.  Acetabular retractors were placed anteriorly and inferiorly with the 2 wing retractors superiorly and posteriorly.  Again, there was moderate acetabular erosion posteriorly.  We deepen the acetabulum and then reamed up to a 53 mm size, 54 mm cup.  Then placed the trial which did not bottom out and then placed the final acetabulum in approximately 45-50 degrees abduction with 15 degrees anteversion.  Final component was inserted with a 3-hole cup with 1 acetabular screw placed.  A trial poly was next placed and trial femur of rasp, we elected then after trialing it to accept the +5 mm neck length which again was very stable, restored leg lengths well.  Final components were inserted, polyethylene followed by femur.  We then trialed off the hip ball again and again decided to use a +5 ceramic head.  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  In doing so, we could not test the prosthesis in extreme flexion, but we did flex it to about 80 degrees with internal rotation to 45 and straight abduction which again was limited due to the patient's size and the prosthesis was noted to be stable with a minimal shuck test.  Copious irrigation was carried out.  Closure  with #2 Ethibond, 2-0 Vicryl, skin clips.  Marcaine 0.25% with epinephrine.  Lightly compressive sterile dressing applied.  Taken to recovery room in stable condition.     Lockie Pares, M.D.     WDC/MEDQ  D:  11/04/2014  T:  11/05/2014  Job:  [id number]

## 2014-11-05 NOTE — Progress Notes (Signed)
RT placed patient on CPAP. Patient wanted RT to increase the pressure because he said it was not enough. Pressure is now 18 cmH2O for patient comfort. RT placed water in water chamber for patient humidification. Patient is tolerating well. RT will monitor and assess as needed.

## 2014-11-05 NOTE — Progress Notes (Signed)
Subjective: 1 Day Post-Op Procedure(s) (LRB): TOTAL HIP ARTHROPLASTY (Left) Patient reports pain as 4 on 0-10 scale.    Objective: Vital signs in last 24 hours: Temp:  [98.3 F (36.8 C)-99.6 F (37.6 C)] 99.6 F (37.6 C) (05/14 0437) Pulse Rate:  [86-96] 86 (05/14 0437) Resp:  [14-26] 20 (05/14 0437) BP: (97-141)/(50-69) 118/62 mmHg (05/14 0437) SpO2:  [95 %-100 %] 95 % (05/14 0437)  Intake/Output from previous day: 05/13 0701 - 05/14 0700 In: 3720 [P.O.:720; I.V.:2500; IV Piggyback:500] Out: 1350 [Urine:550; Blood:800] Intake/Output this shift:    No results for input(s): HGB in the last 72 hours. No results for input(s): WBC, RBC, HCT, PLT in the last 72 hours. No results for input(s): NA, K, CL, CO2, BUN, CREATININE, GLUCOSE, CALCIUM in the last 72 hours. No results for input(s): LABPT, INR in the last 72 hours.  ABD soft Neurovascular intact Sensation intact distally Intact pulses distally Dorsiflexion/Plantar flexion intact Incision: dressing C/D/I  Assessment/Plan: 1 Day Post-Op Procedure(s) (LRB): TOTAL HIP ARTHROPLASTY (Left)  Active Problems:   Primary localized osteoarthritis of left hip  Advance diet Up with therapy Plan for discharge tomorrow Discharge home with home health  Timothy Barton 11/05/2014, 8:13 AM

## 2014-11-05 NOTE — Progress Notes (Signed)
Physical Therapy Treatment Patient Details Name: Timothy Barton MRN: [id number] DOB: 1951-01-09 Today's Date: 11/05/2014    History of Present Illness Pt is a 64 y/o M s/p L THA, post precuations.  Pt's PMH includes R THA ~2 years ago, HTN, B carpal tunnel release, R knee arthroscopy, and back surgery.    PT Comments    Patient's mobility this afternoon limited by fever, generally not feeling well, incr pain and fatigue.  He is highly motivated and managed incentive spirometer without prompting.  Patient wished to stay up to chair through dinner, then return to bed afterwards.  Will continue to follow patient while on this venue of care to progress mobility.   Follow Up Recommendations  Home health PT;Supervision/Assistance - 24 hour     Equipment Recommendations  None recommended by PT    Recommendations for Other Services       Precautions / Restrictions Precautions Precautions: Posterior Hip;Fall Precaution Booklet Issued: Yes (comment) Precaution Comments: Reviewed 3/3 precautions, patient able to verbalize independently  Required Braces or Orthoses: Knee Immobilizer - Left Knee Immobilizer - Left:  (not specified in order) Restrictions Weight Bearing Restrictions: Yes LLE Weight Bearing: Weight bearing as tolerated    Mobility  Bed Mobility               General bed mobility comments: OOB in chair upon arrival  Transfers Overall transfer level: Needs assistance Equipment used: Rolling walker (2 wheeled) Transfers: Sit to/from Stand Sit to Stand: Min assist         General transfer comment: Cues for hand placement and LLE management/placement  Ambulation/Gait Ambulation/Gait assistance: Min guard Ambulation Distance (Feet): 50 Feet Assistive device: Rolling walker (2 wheeled) Gait Pattern/deviations: Step-to pattern;Trunk flexed         Stairs            Wheelchair Mobility    Modified Rankin (Stroke Patients Only)       Balance  Overall balance assessment: Needs assistance Sitting-balance support: No upper extremity supported;Feet supported Sitting balance-Leahy Scale: Fair     Standing balance support: Bilateral upper extremity supported;During functional activity Standing balance-Leahy Scale: Fair                      Cognition Arousal/Alertness: Awake/alert Behavior During Therapy: WFL for tasks assessed/performed Overall Cognitive Status: Within Functional Limits for tasks assessed                      Exercises Total Joint Exercises Ankle Circles/Pumps: AROM;Both;10 reps;Seated    General Comments        Pertinent Vitals/Pain Pain Assessment: 0-10 Pain Score: 5  Faces Pain Scale: Hurts even more Pain Location: left hip Pain Descriptors / Indicators: Sore;Tiring Pain Intervention(s): Limited activity within patient's tolerance;Monitored during session;Patient requesting pain meds-RN notified    Home Living Family/patient expects to be discharged to:: Private residence Living Arrangements: Spouse/significant other;Children (and two 40 yo sons) Available Help at Discharge: Family;Available PRN/intermittently;Friend(s);Neighbor Type of Home: Mobile home Home Access: Stairs to enter Entrance Stairs-Rails: Can reach both;Right;Left Home Layout: One level Home Equipment: Environmental consultant - 2 wheels;Bedside commode;Adaptive equipment      Prior Function Level of Independence: Independent          PT Goals (current goals can now be found in the care plan section) Acute Rehab PT Goals Patient Stated Goal: go home tomorrow PT Goal Formulation: With patient Time For Goal Achievement: 11/11/14 Potential to Achieve Goals: Good  Progress towards PT goals: Not progressing toward goals - comment (fever, pain and fatigue this pm)    Frequency  7X/week    PT Plan Current plan remains appropriate    Co-evaluation             End of Session Equipment Utilized During Treatment: Gait  belt Activity Tolerance: Patient limited by fatigue;Patient limited by pain Patient left: in chair;with call bell/phone within reach     Time: 7121-9758 PT Time Calculation (min) (ACUTE ONLY): 19 min  Charges:                       G CodesMalka So, PT [phone]  [place] 11/05/2014, 5:12 PM

## 2014-11-05 NOTE — Progress Notes (Signed)
Physical Therapy Treatment Patient Details Name: Timothy Barton MRN: [id number] DOB: 12-16-1950 Today's Date: 11/05/2014    History of Present Illness Pt is a 64 y/o M s/p L THA, post precuations.  Pt's PMH includes R THA ~2 years ago, HTN, B carpal tunnel release, R knee arthroscopy, and back surgery.    PT Comments    Patient progressing well with mobility.  Utilized knee immobilizer for position while in chair only as he needs reminders for hip precautions during mobility. Will continue to follow patient while on this venue of care to progress mobility. Stair negotiation limited by Rt knee joint issues including crepitus and pain.  Follow Up Recommendations  Home health PT;Supervision/Assistance - 24 hour     Equipment Recommendations  None recommended by PT    Recommendations for Other Services       Precautions / Restrictions Precautions Precautions: Posterior Hip;Fall Precaution Booklet Issued: Yes (comment) Precaution Comments: Reviewed 3/3 precautions with return demonstration understanding Required Braces or Orthoses: Knee Immobilizer - Left Knee Immobilizer - Left: Other (comment) (wearing KI while in chair, no specific order) Restrictions Weight Bearing Restrictions: Yes LLE Weight Bearing: Weight bearing as tolerated    Mobility  Bed Mobility               General bed mobility comments: OOB in chair upon arrival  Transfers Overall transfer level: Needs assistance Equipment used: Rolling walker (2 wheeled) Transfers: Sit to/from Stand Sit to Stand: Min assist         General transfer comment: mod cues for lef leg placement and hand placement  Ambulation/Gait Ambulation/Gait assistance: Min guard Ambulation Distance (Feet): 150 Feet Assistive device: Rolling walker (2 wheeled) Gait Pattern/deviations: Step-to pattern;Wide base of support;Trunk flexed     General Gait Details: needed 2 standing rest breaks to manage fatigue, leaned against  counter   Stairs Stairs: Yes Stairs assistance: Min guard Stair Management: Two rails;Step to pattern;Forwards Number of Stairs: 6 General stair comments: Rt knee crepitus during stair negotiation which limits his technique.  Typically descends backwards at home  Wheelchair Mobility    Modified Rankin (Stroke Patients Only)       Balance Overall balance assessment: Needs assistance Sitting-balance support: Bilateral upper extremity supported;Feet supported Sitting balance-Leahy Scale: Fair     Standing balance support: Bilateral upper extremity supported Standing balance-Leahy Scale: Fair                      Cognition Arousal/Alertness: Awake/alert Behavior During Therapy: WFL for tasks assessed/performed Overall Cognitive Status: Within Functional Limits for tasks assessed                      Exercises Total Joint Exercises Ankle Circles/Pumps: AROM;Both;10 reps;Seated Quad Sets: AROM;Both;10 reps;Seated Hip ABduction/ADduction: AAROM;Left;10 reps;Seated Long Arc Quad: AROM;10 reps;Seated Knee Flexion: AROM;Left;10 reps;Standing Marching in Standing: AROM;Left;10 reps;Standing Standing Hip Extension: AROM;Left;10 reps;Standing    General Comments General comments (skin integrity, edema, etc.): needs cues for hip precautions during mobility      Pertinent Vitals/Pain Pain Assessment: 0-10 Pain Score: 6  Pain Location: left hip Pain Descriptors / Indicators: Sore;Aching Pain Intervention(s): Limited activity within patient's tolerance;Monitored during session    Home Living                      Prior Function            PT Goals (current goals can now be found in the  care plan section) Acute Rehab PT Goals Patient Stated Goal: get stronger PT Goal Formulation: With patient Time For Goal Achievement: 11/11/14 Potential to Achieve Goals: Good Progress towards PT goals: Progressing toward goals    Frequency  7X/week    PT  Plan Current plan remains appropriate    Co-evaluation             End of Session   Activity Tolerance: Patient tolerated treatment well Patient left: in chair;with call bell/phone within reach;with family/visitor present     Time: 9311-2162 PT Time Calculation (min) (ACUTE ONLY): 31 min  Charges:  $Gait Training: 8-22 mins $Therapeutic Exercise: 8-22 mins                    G CodesMalka Barton, [place] [phone]  [place] 11/05/2014, 12:17 PM

## 2014-11-06 LAB — CBC
HEMATOCRIT: 33.5 % — AB (ref 39.0–52.0)
HEMOGLOBIN: 11.1 g/dL — AB (ref 13.0–17.0)
MCH: 30.2 pg (ref 26.0–34.0)
MCHC: 33.1 g/dL (ref 30.0–36.0)
MCV: 91.3 fL (ref 78.0–100.0)
Platelets: 89 10*3/uL — ABNORMAL LOW (ref 150–400)
RBC: 3.67 MIL/uL — ABNORMAL LOW (ref 4.22–5.81)
RDW: 14.5 % (ref 11.5–15.5)
WBC: 9.2 10*3/uL (ref 4.0–10.5)

## 2014-11-06 MED ORDER — POLYETHYLENE GLYCOL 3350 17 G PO PACK: 17.0000 g | PACK | Freq: Two times a day (BID) | ORAL | Status: DC

## 2014-11-06 NOTE — Progress Notes (Signed)
Physical Therapy Treatment Patient Details Name: Timothy Barton MRN: [id number] DOB: 20-Nov-1950 Today's Date: 11/06/2014    History of Present Illness Pt is a 64 y/o M s/p L THA, post precuations.  Pt's PMH includes R THA ~2 years ago, HTN, B carpal tunnel release, R knee arthroscopy, and back surgery.    PT Comments    Plan is for d/c home today with HHPT.  Follow Up Recommendations  Home health PT;Supervision/Assistance - 24 hour     Equipment Recommendations  None recommended by PT    Recommendations for Other Services       Precautions / Restrictions Precautions Precautions: Posterior Hip;Fall Precaution Comments: Reviewed 3/3 precautions, patient able to verbalize independently  Restrictions LLE Weight Bearing: Weight bearing as tolerated    Mobility  Bed Mobility               General bed mobility comments: Up in recliner upon arrival.  Transfers   Equipment used: Rolling walker (2 wheeled)   Sit to Stand: Min guard            Ambulation/Gait Ambulation/Gait assistance: Supervision Ambulation Distance (Feet): 180 Feet Assistive device: Rolling walker (2 wheeled) Gait Pattern/deviations: Trunk flexed;Step-through pattern;Decreased stride length   Gait velocity interpretation: Below normal speed for age/gender     Stairs            Wheelchair Mobility    Modified Rankin (Stroke Patients Only)       Balance                                    Cognition Arousal/Alertness: Awake/alert Behavior During Therapy: WFL for tasks assessed/performed Overall Cognitive Status: Within Functional Limits for tasks assessed                      Exercises Total Joint Exercises Ankle Circles/Pumps: AROM;Both;10 reps Quad Sets: AROM;Both;10 reps Gluteal Sets: AROM;Both;10 reps    General Comments        Pertinent Vitals/Pain Pain Assessment: 0-10 Pain Score: 2  Pain Location: buttocks Pain Intervention(s):  Monitored during session    Home Living                      Prior Function            PT Goals (current goals can now be found in the care plan section) Acute Rehab PT Goals Patient Stated Goal: home today PT Goal Formulation: With patient Time For Goal Achievement: 11/11/14 Potential to Achieve Goals: Good Progress towards PT goals: Progressing toward goals    Frequency  7X/week    PT Plan Current plan remains appropriate    Co-evaluation             End of Session Equipment Utilized During Treatment: Gait belt Activity Tolerance: Patient tolerated treatment well Patient left: in chair;with family/visitor present;with call bell/phone within reach     Time: 0922-0940 PT Time Calculation (min) (ACUTE ONLY): 18 min  Charges:  $Gait Training: 8-22 mins                    G Codes:      [name] 11/06/2014, 10:22 AM

## 2014-11-06 NOTE — Discharge Summary (Signed)
Patient ID: Timothy Barton MRN: [id number] DOB/AGE: 1951/05/05 64 y.o.  Admit date: 11/04/2014 Discharge date: 11/06/2014  Admission Diagnoses:  Active Problems:   Primary localized osteoarthritis of left hip   Discharge Diagnoses:  Same  Past Medical History  Diagnosis Date  . Hypertension   . Sleep apnea   . Arthritis   . Osteoarthritis of right hip 07/05/2012    Surgeries: Procedure(s): TOTAL HIP ARTHROPLASTY on 11/04/2014   Consultants:    Discharged Condition: Improved  Hospital Course: Timothy Barton is an 64 y.o. male who was admitted 11/04/2014 for operative treatment of<principal problem not specified>. Patient has severe unremitting pain that affects sleep, daily activities, and work/hobbies. After pre-op clearance the patient was taken to the operating room on 11/04/2014 and underwent  Procedure(s): TOTAL HIP ARTHROPLASTY.    Patient was given perioperative antibiotics: Anti-infectives    Start     Dose/Rate Route Frequency Ordered Stop   11/04/14 1400  ceFAZolin (ANCEF) IVPB 2 g/50 mL premix     2 g 100 mL/hr over 30 Minutes Intravenous Every 6 hours 11/04/14 1345 11/04/14 2037   11/04/14 0600  ceFAZolin (ANCEF) 3 g in dextrose 5 % 50 mL IVPB     3 g 160 mL/hr over 30 Minutes Intravenous On call to O.R. 11/03/14 1307 11/04/14 0747       Patient was given sequential compression devices, early ambulation, and chemoprophylaxis to prevent DVT.  Patient benefited maximally from hospital stay and there were no complications.    Recent vital signs: Patient Vitals for the past 24 hrs:  BP Temp Temp src Pulse Resp SpO2  11/06/14 0554 (!) 119/58 mmHg 98.6 F (37 C) Oral 82 20 100 %  11/05/14 2031 (!) 132/58 mmHg 99.8 F (37.7 C) Oral 94 20 94 %  11/05/14 1446 (!) 97/44 mmHg 100.3 F (37.9 C) Oral 97 20 93 %     Recent laboratory studies:  Recent Labs  11/05/14 0717 11/06/14 0539  WBC 9.4 9.2  HGB 12.1* 11.1*  HCT 37.1* 33.5*  PLT 116* 89*  NA 134*   --   K 4.1  --   CL 98*  --   CO2 29  --   BUN 18  --   CREATININE 1.04  --   GLUCOSE 137*  --   CALCIUM 8.4*  --      Discharge Medications:     Medication List    STOP taking these medications        amoxicillin-clavulanate 875-125 MG per tablet  Commonly known as:  AUGMENTIN     diclofenac 75 MG EC tablet  Commonly known as:  VOLTAREN     enoxaparin 40 MG/0.4ML injection  Commonly known as:  LOVENOX     HYDROcodone-acetaminophen 10-325 MG per tablet  Commonly known as:  NORCO     ibuprofen 200 MG tablet  Commonly known as:  ADVIL,MOTRIN      TAKE these medications        apixaban 2.5 MG Tabs tablet  Commonly known as:  ELIQUIS  Take 1 tablet (2.5 mg total) by mouth 2 (two) times daily.     Fish Oil 1360 MG Caps  Take 2 capsules by mouth daily.     GLUCOS-CHONDROIT-MSM COMPLEX PO  Take 2 tablets by mouth daily.     methocarbamol 750 MG tablet  Commonly known as:  ROBAXIN-750  Take 1 tablet (750 mg total) by mouth 4 (four) times daily.     oxyCODONE  5 MG immediate release tablet  Commonly known as:  ROXICODONE  1-2 tabs po q4-6hrs prn pain     polyethylene glycol packet  Commonly known as:  MIRALAX / GLYCOLAX  Take 17 g by mouth 2 (two) times daily.     valsartan-hydrochlorothiazide 320-12.5 MG per tablet  Commonly known as:  DIOVAN-HCT  Take 1 tablet by mouth daily.        Diagnostic Studies: Dg Chest 2 View  10/26/2014   CLINICAL DATA:  Shortness of breath.  Preoperative chest x-ray.  EXAM: CHEST  2 VIEW  COMPARISON:  07/01/2012 .  FINDINGS: Mediastinum hilar structures normal. Lungs are clear of acute infiltrates. Mild stable left basilar pleural parenchymal thickening consistent with scarring. Heart size normal. No acute bony abnormality.  IMPRESSION: 1. No acute cardiopulmonary disease. 2. Mild left base pleural parenchymal thickening consistent with scarring.   Electronically Signed   By: Marcello Moores  Register   On: 10/26/2014 16:29   Dg Hip Port  Unilat With Pelvis 1v Left  11/04/2014   CLINICAL DATA:  Left hip osteoarthritis. Postop from left hip arthroplasty.  EXAM: LEFT HIP (WITH PELVIS) 1 VIEW PORTABLE  COMPARISON:  07/03/2012  FINDINGS: New bipolar left hip prosthesis is seen in expected position. No evidence of fracture or dislocation. Overlying skin staples seen. Old right hip prosthesis is stable in appearance.  IMPRESSION: New bipolar left hip prosthesis in expected position. No evidence of fracture or dislocation.   Electronically Signed   By: Earle Gell M.D.   On: 11/04/2014 12:09    Disposition: 01-Home or Self Care        Follow-up Information    Follow up with CAFFREY JR,W D, MD. Schedule an appointment as soon as possible for a visit in 2 weeks.   Specialty:  Orthopedic Surgery   Contact information:   [place] [address] [phone]       Follow up with [facility].   Contact information:   [address] [place] [address] [phone]       Please follow up.   Why:  Home Health Physical Therapy arranged with [facility].       SignedLinda Hedges 11/06/2014, 10:01 AM

## 2014-11-09 ENCOUNTER — Encounter (HOSPITAL_COMMUNITY): Payer: Self-pay | Admitting: Orthopedic Surgery

## 2014-11-14 LAB — POCT I-STAT 4, (NA,K, GLUC, HGB,HCT)
Glucose, Bld: 167 mg/dL — ABNORMAL HIGH (ref 65–99)
HCT: 40 % (ref 39.0–52.0)
HEMOGLOBIN: 13.6 g/dL (ref 13.0–17.0)
Potassium: 4.5 mmol/L (ref 3.5–5.1)
Sodium: 135 mmol/L (ref 135–145)

## 2015-04-25 ENCOUNTER — Other Ambulatory Visit: Payer: Self-pay | Admitting: Orthopedic Surgery

## 2015-04-25 ENCOUNTER — Other Ambulatory Visit (HOSPITAL_COMMUNITY): Payer: BLUE CROSS/BLUE SHIELD

## 2015-04-25 DIAGNOSIS — M25552 Pain in left hip: Secondary | ICD-10-CM

## 2015-04-25 DIAGNOSIS — M545 Low back pain: Secondary | ICD-10-CM

## 2015-05-05 ENCOUNTER — Encounter (HOSPITAL_COMMUNITY): Admission: RE | Payer: Self-pay | Source: Ambulatory Visit

## 2015-05-05 ENCOUNTER — Inpatient Hospital Stay (HOSPITAL_COMMUNITY)
Admission: RE | Admit: 2015-05-05 | Payer: BLUE CROSS/BLUE SHIELD | Source: Ambulatory Visit | Admitting: Orthopedic Surgery

## 2015-05-05 SURGERY — ARTHROPLASTY, KNEE, TOTAL
Anesthesia: General | Site: Knee | Laterality: Left

## 2015-05-06 ENCOUNTER — Ambulatory Visit
Admission: RE | Admit: 2015-05-06 | Discharge: 2015-05-06 | Disposition: A | Discharge status: Home / Self Care | Payer: BLUE CROSS/BLUE SHIELD | Source: Ambulatory Visit | Attending: Orthopedic Surgery | Admitting: Orthopedic Surgery

## 2015-05-06 DIAGNOSIS — M25552 Pain in left hip: Secondary | ICD-10-CM

## 2015-05-06 DIAGNOSIS — M545 Low back pain: Secondary | ICD-10-CM

## 2015-09-05 ENCOUNTER — Encounter: Payer: Self-pay | Admitting: Neurology

## 2015-09-05 ENCOUNTER — Ambulatory Visit (INDEPENDENT_AMBULATORY_CARE_PROVIDER_SITE_OTHER): Payer: BLUE CROSS/BLUE SHIELD | Admitting: Neurology

## 2015-09-05 VITALS — BP 140/68 | HR 78 | Resp 20 | Ht 69.0 in | Wt 333.0 lb

## 2015-09-05 DIAGNOSIS — R002 Palpitations: Secondary | ICD-10-CM

## 2015-09-05 DIAGNOSIS — G2581 Restless legs syndrome: Secondary | ICD-10-CM | POA: Diagnosis not present

## 2015-09-05 DIAGNOSIS — R600 Localized edema: Secondary | ICD-10-CM

## 2015-09-05 DIAGNOSIS — G4733 Obstructive sleep apnea (adult) (pediatric): Secondary | ICD-10-CM

## 2015-09-05 NOTE — Patient Instructions (Signed)
Based on your symptoms and your exam I believe you still have obstructive sleep apnea or OSA, and I think we should proceed with a sleep study to determine how severe it is. If you have more than mild OSA, I want you to consider ongoing treatment with CPAP, we may have to adjust the pressure setting. Please remember, the risks and ramifications of moderate to severe obstructive sleep apnea or OSA are: Cardiovascular disease, including congestive heart failure, stroke, difficult to control hypertension, arrhythmias, and even type 2 diabetes has been linked to untreated OSA. Sleep apnea causes disruption of sleep and sleep deprivation in most cases, which, in turn, can cause recurrent headaches, problems with memory, mood, concentration, focus, and vigilance. Most people with untreated sleep apnea report excessive daytime sleepiness, which can affect their ability to drive. Please do not drive if you feel sleepy.   I will likely see you back after your sleep study to go over the test results and where to go from there. We will call you after your sleep study to advise about the results (most likely, you will hear from Beverlee Nims, my nurse) and to set up an appointment at the time, as necessary.    Our sleep lab administrative assistant, Arrie Aran will meet with you or call you to schedule your sleep study. If you don't hear back from her by next week please feel free to call her at [phone]. This is her direct line and please leave a message with your phone number to call back if you get the voicemail box. She will call back as soon as possible.

## 2015-09-05 NOTE — Progress Notes (Signed)
Subjective:    Patient ID: Timothy Barton is a 65 y.o. male.  HPI     Timothy Age, MD, PhD [facility] [address], [address]O. [place], [place] [address]  Dear Timothy Barton and Timothy Barton,   I saw your patient, Timothy Barton, upon your kind request in my neurologic clinic today for initial consultation of his sleep disorder, in particular, re-evaluation of his prior diagnosis of obstructive sleep apnea. The patient is unaccompanied today. As you know, Timothy Barton is a 65 year old right-handed gentleman with an underlying medical history of hypertension, arthritis, status post left total hip arthroplasty on 11/04/2014 (R THR in 2014), s/p R sided arthroscopic knee surgery and bilat. CTS surgeries, s/p 3 back surgeries, palpitations, and morbid obesity, who was diagnosed with obstructive sleep apnea about 6 years ago. He has been placed on CPAP therapy and has been compliant with CPAP. He needs a new machine and reevaluation. Prior sleep test results were reviewed: He had a baseline sleep study at [facility] on 10/26/2008, interpreted by Timothy Barton. Sleep efficiency was 88%. He had absence of slow-wave sleep and REM sleep was 14.5%. REM latency was 139 minutes. He had no significant PLMS. Total AHI was from what I can see 21.9 per hour. He had a sleep study with CPAP titration at [facility] on 11/08/2008. Sleep efficiency was 87%. He had absence of slow-wave sleep. REM sleep was 17.6% with a REM latency of 78.5 minutes. It looks like CPAP was titrated from 5 cm to 12 cm.  His original OSA Dx goes back to 1999 or '98, and this is his 2nd machine, but is over 64 years old, has an older model, Escape II and does not use the humidifier, and is faithful with CPAP. His weight was 327 pounds at the time of his baseline sleep study in May 2010. BMI was 49.7. He has had interim weight gain, but overall little fluctuation, in the 5 lb range mostly. He feels not fully  rested at times. He also wakes up sometimes with dry mouth. This has started bothering him in the recent months. He used to never have dry mouth before. He has had palpitations and saw you for this. He had a nuclear stress test last week which per his verbal report was fine. He is scheduled for an echocardiogram tomorrow. He has 1 older brother and one younger brother. Older brother has sleep apnea and uses a CPAP machine. He has one grown biological daughter. He lives with his wife and 2 foster children, twin boys he has had since they were 64 years old, they are now 64 years old. He quit smoking in 1997. He has worked for: [employer] for 40 years. He is on medical leave at this time and may retire in May 2017. He drinks 1 large coffee in the morning and quite a bit of sweet tea throughout the day, 3 large glasses. He has had restless leg symptoms, these symptoms were much more pronounced before he had his left hip replacement surgery. Since then his symptoms are improved. He does move his legs at night. He does not wake up with headaches and typically does not have nocturia but typically wakes up early in the morning around 4 AM. Bedtime is around 10 PM. He does not drink alcohol. He has residual discomfort in his left hip and both knees. He has noted swelling in both distal legs. He has intermittent numbness in his toes.  We were not  able to get a CPAP download from his current machine which he brought in today. He uses a medium nasal mask which he tolerates well. His Epworth sleepiness score is 2 out of 24 today, his fatigue score is 23 out of 63. I reviewed your office note from 08/17/2015, which you kindly included.  His Past Medical History Is Significant For: Past Medical History  Diagnosis Date  . Hypertension   . Sleep apnea   . Arthritis   . Osteoarthritis of right hip 07/05/2012  . A-fib [facility])     His Past Surgical History Is Significant For: Past Surgical History  Procedure Laterality Date   . Carpal tunnel release  2002    right and left  . Arthrosopy    . Knee arthroscopy  2006    right  . Back surgery  2006     ruptured disc,and repair of leak x3  . Total hip arthroplasty  07/03/2012    Procedure: TOTAL HIP ARTHROPLASTY;  Surgeon: Yvette Rack., MD;  Location: [place];  Service: Orthopedics;  Laterality: Right;  with autograft  . Total hip arthroplasty Left 11/04/2014    Procedure: TOTAL HIP ARTHROPLASTY;  Surgeon: Earlie Server, MD;  Location: [place];  Service: Orthopedics;  Laterality: Left;    His Family History Is Significant For: Family History  Problem Relation Barton of Onset  . Stroke Mother     His Social History Is Significant For: Social History   Social History  . Marital Status: Married    Spouse Name: N/A  . Number of Children: 1  . Years of Education: 13   Occupational History  . Retired    Social History Main Topics  . Smoking status: Former Smoker -- 1.50 packs/day for 21 years    Quit date: 07/01/1998  . Smokeless tobacco: Former Systems developer    Types: [place] date: 07/01/1982  . Alcohol Use: No  . Drug Use: No  . Sexual Activity: Not Currently   Other Topics Concern  . None   Social History Narrative   Drinks 1 cup of coffee a day, occasional soda    His Allergies Are:  Allergies  Allergen Reactions  . Tape     Use paper tape only  :   His Current Medications Are:  Outpatient Encounter Prescriptions as of 09/05/2015  Medication Sig  . diclofenac (VOLTAREN) 75 MG EC tablet   . Glucosamine-Chondroitin 1500-1200 MG/30ML LIQD Take by mouth.  . methocarbamol (ROBAXIN-750) 750 MG tablet Take 1 tablet (750 mg total) by mouth 4 (four) times daily.  . metoprolol tartrate (LOPRESSOR) 25 MG tablet   . Misc Natural Products (GLUCOS-CHONDROIT-MSM COMPLEX PO) Take 2 tablets by mouth daily.  . Omega-3 Fatty Acids (FISH OIL) 1360 MG CAPS Take 2 capsules by mouth daily.   Marland Kitchen oxyCODONE (ROXICODONE) 5 MG immediate release tablet 1-2 tabs po  q4-6hrs prn pain  . valsartan-hydrochlorothiazide (DIOVAN-HCT) 320-12.5 MG per tablet Take 1 tablet by mouth daily.  . [DISCONTINUED] apixaban (ELIQUIS) 2.5 MG TABS tablet Take 1 tablet (2.5 mg total) by mouth 2 (two) times daily.  . [DISCONTINUED] polyethylene glycol (MIRALAX / GLYCOLAX) packet Take 17 g by mouth 2 (two) times daily.   No facility-administered encounter medications on file as of 09/05/2015.  :  Review of Systems:  Out of a complete 14 point review of systems, all are reviewed and negative with the exception of these symptoms as listed below:   Review of Systems  Neurological:       Patient has had 2 sleep studies. Last was around 2008. He currently uses CPAP.  Reports some trouble staying asleep, snoring, witnessed apnea   Epworth Sleepiness Scale 0= would never doze 1= slight chance of dozing 2= moderate chance of dozing 3= high chance of dozing  Sitting and reading:0 Watching TV:0 Sitting inactive in a public place (ex. Theater or meeting):0 As a passenger in a car for an hour without a break:0 Lying down to rest in the afternoon:1 Sitting and talking to someone:0 Sitting quietly after lunch (no alcohol):1 In a car, while stopped in traffic:0 Total:2  Objective:  Neurologic Exam  Physical Exam Physical Examination:   Filed Vitals:   09/05/15 0901  BP: 140/68  Pulse: 78  Resp: 20   General Examination: The patient is a very pleasant 65 y.o. male in no acute distress. He appears well-developed and well-nourished and well groomed.   HEENT: Normocephalic, atraumatic, pupils are equal, round and reactive to light and accommodation. Funduscopic exam is normal with sharp disc margins noted. Extraocular tracking is good without limitation to gaze excursion or nystagmus noted. Normal smooth pursuit is noted. Hearing is grossly intact. Tympanic membranes are clear bilaterally. Face is symmetric with normal facial animation and normal facial sensation. Speech is  clear with no dysarthria noted. There is no hypophonia. There is no lip, neck/head, jaw or voice tremor. Neck is supple with full range of passive and active motion. There are no carotid bruits on auscultation. Oropharynx exam reveals: moderate mouth dryness, adequate dental hygiene and moderate airway crowding, due to Larger tongue, large uvula, tonsils in place, about 1+ bilaterally. Mallampati is class II. Tongue protrudes centrally and palate elevates symmetrically. Neck size is 21.5 inches. He has a Mild overbite.   Chest: Clear to auscultation without wheezing, rhonchi or crackles noted.  Heart: S1+S2+0, regular and normal without murmurs, rubs or gallops noted.   Abdomen: Soft, non-tender and non-distended with normal bowel sounds appreciated on auscultation.  Extremities: There is 1+ pitting edema in the distal lower extremities bilaterally. Pedal pulses are intact.  Skin: Warm and dry without trophic changes noted. There are no varicose veins. He has some discoloration in his right distal leg, chronic hyper pigmentation.  Musculoskeletal: exam reveals no obvious joint deformities, tenderness or joint swelling or erythema, 50 exception of discomfort in his left hip and both knees reported.   Neurologically:  Mental status: The patient is awake, alert and oriented in all 4 spheres. His immediate and remote memory, attention, language skills and fund of knowledge are appropriate. There is no evidence of aphasia, agnosia, apraxia or anomia. Speech is clear with normal prosody and enunciation. Thought process is linear. Mood is normal and affect is normal.  Cranial nerves II - XII are as described above under HEENT exam. In addition: shoulder shrug is normal with equal shoulder height noted. Motor exam: Normal bulk, strength and tone is noted. There is no drift, tremor or rebound. Romberg shows mild swaying. Reflexes are 1+ in the upper extremities, trace in both knees and absent in the ankles.  Babinski: Toes are flexor bilaterally. Fine motor skills and coordination: intact with normal finger taps, normal hand movements, normal rapid alternating patting, normal foot taps and normal foot agility.  Cerebellar testing: No dysmetria or intention tremor on finger to nose testing. Heel to shin isvery difficult bilaterally, secondary to body habitus and left side also worse than right secondary to left hip discomfort. There is  no truncal or gait ataxia.  Sensory exam: intact to light touch, pinprick, vibration, temperature sense i the upper and lower extremities.  Gait, station and balance: He stands with mild difficulty. No veering to one side is noted. No leaning to one side is noted. Posture is Barton-appropriate and stance is narrow based. Gait shows normal stride length and normal pace. No problems turning are noted. Tandem walk is  not possible for him.              Assessment and Plan:  In summary, Timothy Barton is a very pleasant 65 y.o.-year old male with an underlying medical history of hypertension, arthritis, status post left total hip arthroplasty on 11/04/2014 (R THR in 2014), s/p R sided arthroscopic knee surgery and bilat. CTS surgeries, s/p 3 back surgeries, palpitations, and morbid obesity, who was diagnosed with obstructive sleep apnea many years ago, with reevaluation done over 6 years ago and his current CPAP machine is an older model, over 29 years old. He indicates compliant with CPAP therapy, has gained a little bit of weight, has had some residual nonrestorative sleep, and would benefit from reevaluation with the proper split-night sleep study. In addition, he reports restless leg symptoms, which are infrequent at this time, better since his left hip replacement surgery. I had a long chat with the patient about my findings and the diagnosis of OSA, its prognosis and treatment options. We talked about medical treatments, surgical interventions and non-pharmacological approaches. I  explained in particular the risks and ramifications of untreated moderate to severe OSA, especially with respect to developing cardiovascular disease down the Road, including congestive heart failure, difficult to treat hypertension, cardiac arrhythmias, or stroke. Even type 2 diabetes has, in part, been linked to untreated OSA. Symptoms of untreated OSA include daytime sleepiness, memory problems, mood irritability and mood disorder such as depression and anxiety, lack of energy, as well as recurrent headaches, especially morning headaches. We talked about trying to maintain a healthy lifestyle in general, as well as the importance of weight control. I encouraged the patient to eat healthy, exercise daily and keep well hydrated, to keep a scheduled bedtime and wake time routine, to not skip any meals and eat healthy snacks in between meals. I advised the patient not to drive when feeling sleepy. Furthermore, he is encouraged to drink more water, reduce his caffeine intake, particularly his sweet tea intake because of the additional sugar calories as well. In addition, he reports palpitations which can get worse with caffeine. I recommended the following at this time: sleep study with potential positive airway pressure titration. (We will score hypopneas at 3% and split the sleep study into diagnostic and treatment portion, if the estimated. 2 hour AHI is >15/h).   I explained the sleep test procedure to the patient and also outlined possible surgical and non-surgical treatment options of OSA, including the use of a custom-made dental device (which would require a referral to a specialist dentist or oral surgeon), upper airway surgical options, such as pillar implants, radiofrequency surgery, tongue base surgery, and UPPP (which would involve a referral to an ENT surgeon). Rarely, jaw surgery such as mandibular advancement may be considered.  I also explained the CPAP treatment option to the patient, who  indicated that he would be willing to continue with CPAP if the need arises. I explained the importance of being compliant with PAP treatment, not only for insurance purposes but primarily to improve His symptoms, and for the patient's long  term health benefit, including to reduce His cardiovascular risks. I answered all his questions today and the patient was in agreement. I would like to see him back after the sleep study is completed and encouraged him to call with any interim questions, concerns, problems or updates.   Thank you very much for allowing me to participate in the care of this nice patient. If I can be of any further assistance to you please do not hesitate to call me at [phone].  Sincerely,   Timothy Age, MD, PhD

## 2015-10-02 ENCOUNTER — Ambulatory Visit (INDEPENDENT_AMBULATORY_CARE_PROVIDER_SITE_OTHER): Payer: BLUE CROSS/BLUE SHIELD | Admitting: Neurology

## 2015-10-02 DIAGNOSIS — G479 Sleep disorder, unspecified: Secondary | ICD-10-CM

## 2015-10-02 DIAGNOSIS — G4761 Periodic limb movement disorder: Secondary | ICD-10-CM

## 2015-10-02 DIAGNOSIS — G4733 Obstructive sleep apnea (adult) (pediatric): Secondary | ICD-10-CM | POA: Diagnosis not present

## 2015-10-03 NOTE — Sleep Study (Signed)
Please see the scanned sleep study interpretation located in the Procedure tab within the Chart Review section. 

## 2015-10-11 ENCOUNTER — Telehealth: Payer: Self-pay | Admitting: Neurology

## 2015-10-11 DIAGNOSIS — G4733 Obstructive sleep apnea (adult) (pediatric): Secondary | ICD-10-CM

## 2015-10-11 NOTE — Telephone Encounter (Signed)
Diana:  Patient referred by Dr. Einar Gip, seen by me on 09/05/15, split study on 10/02/15, Ins: [employer]. Please call and notify patient that the recent sleep study confirmed the diagnosis of mod to severe OSA. He did well with CPAP during the study with significant improvement of the respiratory events. I will write for a new CPAP machine. He may already have a DME he was using these past years for his previous CPAP. The patient will need a follow up appointment with me in 8 to 10 weeks post set up that has to be scheduled; please go ahead and schedule while you have the patient on the phone and make sure patient understands the importance of keeping this window for the FU appointment, as it is often an insurance requirement and failing to adhere to this may result in losing coverage for sleep apnea treatment.  Please re-enforce the importance of compliance with treatment and the need for [place] to monitor compliance data - again an insurance requirement and good feedback for the patient as far as how they are doing.  Also remind patient, that any upcoming CPAP machine or mask issues, should be first addressed with the DME company. Please ask if patient has a preference regarding DME company.  Please arrange for CPAP set up at home through a DME company of patient's choice - once you have spoken to the patient - and faxed/routed report to PCP and referring MD (if other than PCP), you can close this encounter, thanks,   Star Age, MD, PhD [facility] ([employer])

## 2015-10-12 NOTE — Telephone Encounter (Signed)
LM on cell and home number to call back for sleep study results.

## 2015-10-12 NOTE — Telephone Encounter (Signed)
I spoke to patient and he is aware of results and recommendations. He has CPAP at home but would like a new machine. He asked that I send the orders to [facility]. I will fax today and send report to PCP and Dr. Einar Gip.

## 2015-10-12 NOTE — Telephone Encounter (Signed)
LM to call back.

## 2015-10-12 NOTE — Telephone Encounter (Signed)
Patient is returning your call.  

## 2016-09-04 DIAGNOSIS — G4733 Obstructive sleep apnea (adult) (pediatric): Secondary | ICD-10-CM | POA: Diagnosis not present

## 2016-09-04 DIAGNOSIS — M170 Bilateral primary osteoarthritis of knee: Secondary | ICD-10-CM | POA: Diagnosis not present

## 2016-09-04 DIAGNOSIS — I872 Venous insufficiency (chronic) (peripheral): Secondary | ICD-10-CM | POA: Diagnosis not present

## 2016-09-04 DIAGNOSIS — I10 Essential (primary) hypertension: Secondary | ICD-10-CM | POA: Diagnosis not present

## 2017-01-23 DIAGNOSIS — Z299 Encounter for prophylactic measures, unspecified: Secondary | ICD-10-CM | POA: Diagnosis not present

## 2017-01-23 DIAGNOSIS — Z713 Dietary counseling and surveillance: Secondary | ICD-10-CM | POA: Diagnosis not present

## 2017-01-23 DIAGNOSIS — M1711 Unilateral primary osteoarthritis, right knee: Secondary | ICD-10-CM | POA: Diagnosis not present

## 2017-01-23 DIAGNOSIS — R0600 Dyspnea, unspecified: Secondary | ICD-10-CM | POA: Diagnosis not present

## 2017-01-23 DIAGNOSIS — Z6841 Body Mass Index (BMI) 40.0 and over, adult: Secondary | ICD-10-CM | POA: Diagnosis not present

## 2017-01-23 DIAGNOSIS — M545 Low back pain: Secondary | ICD-10-CM | POA: Diagnosis not present

## 2017-01-23 DIAGNOSIS — G4730 Sleep apnea, unspecified: Secondary | ICD-10-CM | POA: Diagnosis not present

## 2017-01-23 DIAGNOSIS — I10 Essential (primary) hypertension: Secondary | ICD-10-CM | POA: Diagnosis not present

## 2017-02-03 DIAGNOSIS — R0602 Shortness of breath: Secondary | ICD-10-CM | POA: Diagnosis not present

## 2017-02-13 DIAGNOSIS — Z299 Encounter for prophylactic measures, unspecified: Secondary | ICD-10-CM | POA: Diagnosis not present

## 2017-02-13 DIAGNOSIS — Z1389 Encounter for screening for other disorder: Secondary | ICD-10-CM | POA: Diagnosis not present

## 2017-02-13 DIAGNOSIS — G4730 Sleep apnea, unspecified: Secondary | ICD-10-CM | POA: Diagnosis not present

## 2017-02-13 DIAGNOSIS — Z125 Encounter for screening for malignant neoplasm of prostate: Secondary | ICD-10-CM | POA: Diagnosis not present

## 2017-02-13 DIAGNOSIS — Z7189 Other specified counseling: Secondary | ICD-10-CM | POA: Diagnosis not present

## 2017-02-13 DIAGNOSIS — Z79899 Other long term (current) drug therapy: Secondary | ICD-10-CM | POA: Diagnosis not present

## 2017-02-13 DIAGNOSIS — Z0000 Encounter for general adult medical examination without abnormal findings: Secondary | ICD-10-CM | POA: Diagnosis not present

## 2017-02-13 DIAGNOSIS — M1990 Unspecified osteoarthritis, unspecified site: Secondary | ICD-10-CM | POA: Diagnosis not present

## 2017-02-13 DIAGNOSIS — I10 Essential (primary) hypertension: Secondary | ICD-10-CM | POA: Diagnosis not present

## 2017-02-13 DIAGNOSIS — Z1211 Encounter for screening for malignant neoplasm of colon: Secondary | ICD-10-CM | POA: Diagnosis not present

## 2017-02-13 DIAGNOSIS — Z6841 Body Mass Index (BMI) 40.0 and over, adult: Secondary | ICD-10-CM | POA: Diagnosis not present

## 2017-04-15 DIAGNOSIS — Z299 Encounter for prophylactic measures, unspecified: Secondary | ICD-10-CM | POA: Diagnosis not present

## 2017-04-15 DIAGNOSIS — I10 Essential (primary) hypertension: Secondary | ICD-10-CM | POA: Diagnosis not present

## 2017-04-15 DIAGNOSIS — K5730 Diverticulosis of large intestine without perforation or abscess without bleeding: Secondary | ICD-10-CM | POA: Diagnosis not present

## 2017-04-15 DIAGNOSIS — D696 Thrombocytopenia, unspecified: Secondary | ICD-10-CM | POA: Diagnosis not present

## 2017-04-15 DIAGNOSIS — R945 Abnormal results of liver function studies: Secondary | ICD-10-CM | POA: Diagnosis not present

## 2017-04-15 DIAGNOSIS — G4730 Sleep apnea, unspecified: Secondary | ICD-10-CM | POA: Diagnosis not present

## 2017-04-15 DIAGNOSIS — Z6841 Body Mass Index (BMI) 40.0 and over, adult: Secondary | ICD-10-CM | POA: Diagnosis not present

## 2017-04-21 DIAGNOSIS — R161 Splenomegaly, not elsewhere classified: Secondary | ICD-10-CM | POA: Diagnosis not present

## 2017-04-21 DIAGNOSIS — K760 Fatty (change of) liver, not elsewhere classified: Secondary | ICD-10-CM | POA: Diagnosis not present

## 2017-04-21 DIAGNOSIS — R945 Abnormal results of liver function studies: Secondary | ICD-10-CM | POA: Diagnosis not present

## 2017-05-06 DIAGNOSIS — H6123 Impacted cerumen, bilateral: Secondary | ICD-10-CM | POA: Diagnosis not present

## 2017-07-01 DIAGNOSIS — G4730 Sleep apnea, unspecified: Secondary | ICD-10-CM | POA: Diagnosis not present

## 2017-07-01 DIAGNOSIS — I10 Essential (primary) hypertension: Secondary | ICD-10-CM | POA: Diagnosis not present

## 2017-07-01 DIAGNOSIS — Z299 Encounter for prophylactic measures, unspecified: Secondary | ICD-10-CM | POA: Diagnosis not present

## 2017-07-01 DIAGNOSIS — Z6841 Body Mass Index (BMI) 40.0 and over, adult: Secondary | ICD-10-CM | POA: Diagnosis not present

## 2017-07-01 DIAGNOSIS — Z87891 Personal history of nicotine dependence: Secondary | ICD-10-CM | POA: Diagnosis not present

## 2017-07-01 DIAGNOSIS — N644 Mastodynia: Secondary | ICD-10-CM | POA: Diagnosis not present

## 2017-08-04 DIAGNOSIS — N644 Mastodynia: Secondary | ICD-10-CM | POA: Diagnosis not present

## 2017-09-30 DIAGNOSIS — M1990 Unspecified osteoarthritis, unspecified site: Secondary | ICD-10-CM | POA: Diagnosis not present

## 2017-09-30 DIAGNOSIS — Z299 Encounter for prophylactic measures, unspecified: Secondary | ICD-10-CM | POA: Diagnosis not present

## 2017-09-30 DIAGNOSIS — R945 Abnormal results of liver function studies: Secondary | ICD-10-CM | POA: Diagnosis not present

## 2017-09-30 DIAGNOSIS — I10 Essential (primary) hypertension: Secondary | ICD-10-CM | POA: Diagnosis not present

## 2017-09-30 DIAGNOSIS — G4730 Sleep apnea, unspecified: Secondary | ICD-10-CM | POA: Diagnosis not present

## 2017-09-30 DIAGNOSIS — Z6836 Body mass index (BMI) 36.0-36.9, adult: Secondary | ICD-10-CM | POA: Diagnosis not present

## 2017-11-10 DIAGNOSIS — H903 Sensorineural hearing loss, bilateral: Secondary | ICD-10-CM | POA: Diagnosis not present

## 2017-11-10 DIAGNOSIS — H6123 Impacted cerumen, bilateral: Secondary | ICD-10-CM | POA: Diagnosis not present

## 2018-02-19 DIAGNOSIS — R5383 Other fatigue: Secondary | ICD-10-CM | POA: Diagnosis not present

## 2018-02-19 DIAGNOSIS — Z7189 Other specified counseling: Secondary | ICD-10-CM | POA: Diagnosis not present

## 2018-02-19 DIAGNOSIS — M1990 Unspecified osteoarthritis, unspecified site: Secondary | ICD-10-CM | POA: Diagnosis not present

## 2018-02-19 DIAGNOSIS — Z1331 Encounter for screening for depression: Secondary | ICD-10-CM | POA: Diagnosis not present

## 2018-02-19 DIAGNOSIS — Z0000 Encounter for general adult medical examination without abnormal findings: Secondary | ICD-10-CM | POA: Diagnosis not present

## 2018-02-19 DIAGNOSIS — Z6841 Body Mass Index (BMI) 40.0 and over, adult: Secondary | ICD-10-CM | POA: Diagnosis not present

## 2018-02-19 DIAGNOSIS — Z299 Encounter for prophylactic measures, unspecified: Secondary | ICD-10-CM | POA: Diagnosis not present

## 2018-02-19 DIAGNOSIS — G4730 Sleep apnea, unspecified: Secondary | ICD-10-CM | POA: Diagnosis not present

## 2018-02-19 DIAGNOSIS — Z1211 Encounter for screening for malignant neoplasm of colon: Secondary | ICD-10-CM | POA: Diagnosis not present

## 2018-02-19 DIAGNOSIS — I10 Essential (primary) hypertension: Secondary | ICD-10-CM | POA: Diagnosis not present

## 2018-03-04 DIAGNOSIS — R0602 Shortness of breath: Secondary | ICD-10-CM | POA: Diagnosis not present

## 2018-03-19 DIAGNOSIS — I10 Essential (primary) hypertension: Secondary | ICD-10-CM | POA: Diagnosis not present

## 2018-03-19 DIAGNOSIS — G4730 Sleep apnea, unspecified: Secondary | ICD-10-CM | POA: Diagnosis not present

## 2018-03-19 DIAGNOSIS — J449 Chronic obstructive pulmonary disease, unspecified: Secondary | ICD-10-CM | POA: Diagnosis not present

## 2018-03-19 DIAGNOSIS — Z6841 Body Mass Index (BMI) 40.0 and over, adult: Secondary | ICD-10-CM | POA: Diagnosis not present

## 2018-03-19 DIAGNOSIS — Z299 Encounter for prophylactic measures, unspecified: Secondary | ICD-10-CM | POA: Diagnosis not present

## 2018-03-23 ENCOUNTER — Other Ambulatory Visit: Payer: Self-pay | Admitting: Orthopedic Surgery

## 2018-03-23 DIAGNOSIS — M545 Low back pain: Secondary | ICD-10-CM | POA: Diagnosis not present

## 2018-03-23 DIAGNOSIS — Z6841 Body Mass Index (BMI) 40.0 and over, adult: Secondary | ICD-10-CM | POA: Diagnosis not present

## 2018-03-25 ENCOUNTER — Ambulatory Visit
Admission: RE | Admit: 2018-03-25 | Discharge: 2018-03-25 | Disposition: A | Discharge status: Home / Self Care | Payer: Medicare Other | Source: Ambulatory Visit | Attending: Orthopedic Surgery | Admitting: Orthopedic Surgery

## 2018-03-25 DIAGNOSIS — M545 Low back pain, unspecified: Secondary | ICD-10-CM

## 2018-04-06 DIAGNOSIS — M545 Low back pain: Secondary | ICD-10-CM | POA: Diagnosis not present

## 2018-04-06 DIAGNOSIS — Z6841 Body Mass Index (BMI) 40.0 and over, adult: Secondary | ICD-10-CM | POA: Diagnosis not present

## 2018-04-10 DIAGNOSIS — Z713 Dietary counseling and surveillance: Secondary | ICD-10-CM | POA: Diagnosis not present

## 2018-04-10 DIAGNOSIS — Z6841 Body Mass Index (BMI) 40.0 and over, adult: Secondary | ICD-10-CM | POA: Diagnosis not present

## 2018-04-10 DIAGNOSIS — Z299 Encounter for prophylactic measures, unspecified: Secondary | ICD-10-CM | POA: Diagnosis not present

## 2018-04-10 DIAGNOSIS — J449 Chronic obstructive pulmonary disease, unspecified: Secondary | ICD-10-CM | POA: Diagnosis not present

## 2018-05-19 DIAGNOSIS — M170 Bilateral primary osteoarthritis of knee: Secondary | ICD-10-CM | POA: Diagnosis not present

## 2018-05-26 DIAGNOSIS — M170 Bilateral primary osteoarthritis of knee: Secondary | ICD-10-CM | POA: Diagnosis not present

## 2018-06-15 DIAGNOSIS — M170 Bilateral primary osteoarthritis of knee: Secondary | ICD-10-CM | POA: Diagnosis not present

## 2019-08-09 ENCOUNTER — Ambulatory Visit: Payer: Medicare Other | Attending: Internal Medicine

## 2019-08-09 ENCOUNTER — Other Ambulatory Visit: Payer: Self-pay

## 2019-08-09 DIAGNOSIS — Z20822 Contact with and (suspected) exposure to covid-19: Secondary | ICD-10-CM

## 2019-08-10 LAB — NOVEL CORONAVIRUS, NAA: SARS-CoV-2, NAA: NOT DETECTED

## 2019-08-18 DIAGNOSIS — I700 Atherosclerosis of aorta: Secondary | ICD-10-CM | POA: Diagnosis not present

## 2019-08-18 DIAGNOSIS — I10 Essential (primary) hypertension: Secondary | ICD-10-CM | POA: Diagnosis not present

## 2019-08-18 DIAGNOSIS — D696 Thrombocytopenia, unspecified: Secondary | ICD-10-CM | POA: Diagnosis not present

## 2019-08-18 DIAGNOSIS — Z299 Encounter for prophylactic measures, unspecified: Secondary | ICD-10-CM | POA: Diagnosis not present

## 2019-08-18 DIAGNOSIS — J449 Chronic obstructive pulmonary disease, unspecified: Secondary | ICD-10-CM | POA: Diagnosis not present

## 2019-09-15 DIAGNOSIS — G4730 Sleep apnea, unspecified: Secondary | ICD-10-CM | POA: Diagnosis not present

## 2019-09-15 DIAGNOSIS — K5790 Diverticulosis of intestine, part unspecified, without perforation or abscess without bleeding: Secondary | ICD-10-CM | POA: Diagnosis not present

## 2019-09-15 DIAGNOSIS — R197 Diarrhea, unspecified: Secondary | ICD-10-CM | POA: Diagnosis not present

## 2019-09-15 DIAGNOSIS — Z86010 Personal history of colonic polyps: Secondary | ICD-10-CM | POA: Diagnosis not present

## 2019-09-20 DIAGNOSIS — Z01818 Encounter for other preprocedural examination: Secondary | ICD-10-CM | POA: Diagnosis not present

## 2019-09-28 DIAGNOSIS — K635 Polyp of colon: Secondary | ICD-10-CM | POA: Diagnosis not present

## 2019-09-28 DIAGNOSIS — R197 Diarrhea, unspecified: Secondary | ICD-10-CM | POA: Diagnosis not present

## 2019-09-28 DIAGNOSIS — K529 Noninfective gastroenteritis and colitis, unspecified: Secondary | ICD-10-CM | POA: Diagnosis not present

## 2019-09-28 DIAGNOSIS — K5730 Diverticulosis of large intestine without perforation or abscess without bleeding: Secondary | ICD-10-CM | POA: Diagnosis not present

## 2019-09-28 DIAGNOSIS — Z86010 Personal history of colonic polyps: Secondary | ICD-10-CM | POA: Diagnosis not present

## 2019-09-28 DIAGNOSIS — K52839 Microscopic colitis, unspecified: Secondary | ICD-10-CM | POA: Diagnosis not present

## 2019-10-18 DIAGNOSIS — R5383 Other fatigue: Secondary | ICD-10-CM | POA: Diagnosis not present

## 2019-10-18 DIAGNOSIS — Z0000 Encounter for general adult medical examination without abnormal findings: Secondary | ICD-10-CM | POA: Diagnosis not present

## 2019-10-18 DIAGNOSIS — Z86010 Personal history of colonic polyps: Secondary | ICD-10-CM | POA: Diagnosis not present

## 2019-10-18 DIAGNOSIS — K5790 Diverticulosis of intestine, part unspecified, without perforation or abscess without bleeding: Secondary | ICD-10-CM | POA: Diagnosis not present

## 2019-10-18 DIAGNOSIS — D696 Thrombocytopenia, unspecified: Secondary | ICD-10-CM | POA: Diagnosis not present

## 2019-10-18 DIAGNOSIS — Z299 Encounter for prophylactic measures, unspecified: Secondary | ICD-10-CM | POA: Diagnosis not present

## 2019-10-18 DIAGNOSIS — Z79899 Other long term (current) drug therapy: Secondary | ICD-10-CM | POA: Diagnosis not present

## 2019-10-18 DIAGNOSIS — I700 Atherosclerosis of aorta: Secondary | ICD-10-CM | POA: Diagnosis not present

## 2019-10-18 DIAGNOSIS — Z7189 Other specified counseling: Secondary | ICD-10-CM | POA: Diagnosis not present

## 2019-10-18 DIAGNOSIS — I10 Essential (primary) hypertension: Secondary | ICD-10-CM | POA: Diagnosis not present

## 2019-10-18 DIAGNOSIS — R197 Diarrhea, unspecified: Secondary | ICD-10-CM | POA: Diagnosis not present

## 2019-10-18 DIAGNOSIS — Z1211 Encounter for screening for malignant neoplasm of colon: Secondary | ICD-10-CM | POA: Diagnosis not present

## 2019-10-20 DIAGNOSIS — I10 Essential (primary) hypertension: Secondary | ICD-10-CM | POA: Diagnosis not present

## 2019-10-20 DIAGNOSIS — M1710 Unilateral primary osteoarthritis, unspecified knee: Secondary | ICD-10-CM | POA: Diagnosis not present

## 2019-10-20 DIAGNOSIS — Z299 Encounter for prophylactic measures, unspecified: Secondary | ICD-10-CM | POA: Diagnosis not present

## 2019-11-25 DIAGNOSIS — B078 Other viral warts: Secondary | ICD-10-CM | POA: Diagnosis not present

## 2019-11-25 DIAGNOSIS — L821 Other seborrheic keratosis: Secondary | ICD-10-CM | POA: Diagnosis not present

## 2019-11-25 DIAGNOSIS — L820 Inflamed seborrheic keratosis: Secondary | ICD-10-CM | POA: Diagnosis not present

## 2019-11-25 DIAGNOSIS — L570 Actinic keratosis: Secondary | ICD-10-CM | POA: Diagnosis not present

## 2019-11-25 DIAGNOSIS — X32XXXA Exposure to sunlight, initial encounter: Secondary | ICD-10-CM | POA: Diagnosis not present

## 2020-02-04 ENCOUNTER — Encounter (INDEPENDENT_AMBULATORY_CARE_PROVIDER_SITE_OTHER): Payer: Self-pay | Admitting: Otolaryngology

## 2020-02-04 ENCOUNTER — Other Ambulatory Visit: Payer: Self-pay

## 2020-02-04 ENCOUNTER — Ambulatory Visit (INDEPENDENT_AMBULATORY_CARE_PROVIDER_SITE_OTHER): Payer: Medicare Other | Admitting: Otolaryngology

## 2020-02-04 VITALS — Temp 97.3°F

## 2020-02-04 DIAGNOSIS — H6123 Impacted cerumen, bilateral: Secondary | ICD-10-CM

## 2020-02-04 NOTE — Progress Notes (Signed)
HPI: Timothy Barton is a 69 y.o. male who presents for evaluation of wax buildup in both ears for his annual cleaning..  Past Medical History:  Diagnosis Date  . A-fib ([place])   . Arthritis   . Hypertension   . Osteoarthritis of right hip 07/05/2012  . Sleep apnea    Past Surgical History:  Procedure Laterality Date  . arthrosopy    . BACK SURGERY  2006    ruptured disc,and repair of leak x3  . CARPAL TUNNEL RELEASE  2002   right and left  . KNEE ARTHROSCOPY  2006   right  . TOTAL HIP ARTHROPLASTY  07/03/2012   Procedure: TOTAL HIP ARTHROPLASTY;  Surgeon: Yvette Rack., MD;  Location: [place];  Service: Orthopedics;  Laterality: Right;  with autograft  . TOTAL HIP ARTHROPLASTY Left 11/04/2014   Procedure: TOTAL HIP ARTHROPLASTY;  Surgeon: Earlie Server, MD;  Location: [place];  Service: Orthopedics;  Laterality: Left;   Social History   Socioeconomic History  . Marital status: Married    Spouse name: Not on file  . Number of children: 1  . Years of education: 65  . Highest education level: Not on file  Occupational History  . Occupation: Retired  Tobacco Use  . Smoking status: Former Smoker    Packs/day: 1.50    Years: 21.00    Pack years: 31.50    Quit date: 07/01/1998    Years since quitting: 21.6  . Smokeless tobacco: Former Systems developer    Types: [place] date: 07/01/1982  Substance and Sexual Activity  . Alcohol use: No    Alcohol/week: 0.0 standard drinks  . Drug use: No  . Sexual activity: Not Currently  Other Topics Concern  . Not on file  Social History Narrative   Drinks 1 cup of coffee a day, occasional soda   Social Determinants of Health   Financial Resource Strain:   . Difficulty of Paying Living Expenses:   Food Insecurity:   . Worried About Charity fundraiser in the Last Year:   . Arboriculturist in the Last Year:   Transportation Needs:   . Film/video editor (Medical):   Marland Kitchen Lack of Transportation (Non-Medical):   Physical Activity:   . Days  of Exercise per Week:   . Minutes of Exercise per Session:   Stress:   . Feeling of Stress :   Social Connections:   . Frequency of Communication with Friends and Family:   . Frequency of Social Gatherings with Friends and Family:   . Attends Religious Services:   . Active Member of Clubs or Organizations:   . Attends Archivist Meetings:   Marland Kitchen Marital Status:    Family History  Problem Relation Age of Onset  . Stroke Mother    Allergies  Allergen Reactions  . Tape     Use paper tape only   Prior to Admission medications   Medication Sig Start Date End Date Taking? Authorizing Provider  diclofenac (VOLTAREN) 75 MG EC tablet  07/13/15  Yes [provider]  Glucosamine-Chondroitin 1500-1200 MG/30ML LIQD Take by mouth.   Yes [provider]  methocarbamol (ROBAXIN-750) 750 MG tablet Take 1 tablet (750 mg total) by mouth 4 (four) times daily. 11/04/14  Yes Chadwell, Vonna Kotyk, PA-C  metoprolol tartrate (LOPRESSOR) 25 MG tablet  08/17/15  Yes [provider]  Misc Natural Products (GLUCOS-CHONDROIT-MSM COMPLEX PO) Take 2 tablets by mouth daily.  Yes [provider]  Omega-3 Fatty Acids (FISH OIL) 1360 MG CAPS Take 2 capsules by mouth daily.    Yes [provider]  oxyCODONE (ROXICODONE) 5 MG immediate release tablet 1-2 tabs po q4-6hrs prn pain 11/04/14  Yes Chadwell, Vonna Kotyk, PA-C  valsartan-hydrochlorothiazide (DIOVAN-HCT) 320-12.5 MG per tablet Take 1 tablet by mouth daily.   Yes [provider]     Positive ROS: Otherwise negative  All other systems have been reviewed and were otherwise negative with the exception of those mentioned in the HPI and as above.  Physical Exam: Constitutional: Alert, well-appearing, no acute distress Ears: External ears without lesions or tenderness. Ear canals large amount of wax in both ear canals right side worse than left. This was cleaned with curettes and suction. TMs were otherwise  clear.. Nasal: External nose without lesions. Clear nasal passages Oral: Oropharynx clear. Neck: No palpable adenopathy or masses Respiratory: Breathing comfortably  Skin: No facial/neck lesions or rash noted.  Cerumen impaction removal  Date/Time: 02/04/2020 2:08 PM Performed by: Rozetta Nunnery, MD Authorized by: Rozetta Nunnery, MD   Consent:    Consent obtained:  Verbal   Consent given by:  Patient   Risks discussed:  Pain and bleeding Procedure details:    Location:  L ear and R ear   Procedure type: curette and suction   Post-procedure details:    Inspection:  TM intact and canal normal   Hearing quality:  Improved   Patient tolerance of procedure:  Tolerated well, no immediate complications Comments:     TMs are clear bilaterally    Assessment: Bilateral cerumen impactions  Plan: He will follow up on annual basis for recheck and cleaning.  Radene Journey, MD

## 2020-02-21 DIAGNOSIS — J449 Chronic obstructive pulmonary disease, unspecified: Secondary | ICD-10-CM | POA: Diagnosis not present

## 2020-02-21 DIAGNOSIS — Z299 Encounter for prophylactic measures, unspecified: Secondary | ICD-10-CM | POA: Diagnosis not present

## 2020-02-21 DIAGNOSIS — D696 Thrombocytopenia, unspecified: Secondary | ICD-10-CM | POA: Diagnosis not present

## 2020-02-21 DIAGNOSIS — N183 Chronic kidney disease, stage 3 unspecified: Secondary | ICD-10-CM | POA: Diagnosis not present

## 2020-02-21 DIAGNOSIS — I10 Essential (primary) hypertension: Secondary | ICD-10-CM | POA: Diagnosis not present

## 2020-02-24 DIAGNOSIS — K760 Fatty (change of) liver, not elsewhere classified: Secondary | ICD-10-CM | POA: Diagnosis not present

## 2020-02-24 DIAGNOSIS — R945 Abnormal results of liver function studies: Secondary | ICD-10-CM | POA: Diagnosis not present

## 2020-02-24 DIAGNOSIS — R932 Abnormal findings on diagnostic imaging of liver and biliary tract: Secondary | ICD-10-CM | POA: Diagnosis not present

## 2020-02-24 DIAGNOSIS — R161 Splenomegaly, not elsewhere classified: Secondary | ICD-10-CM | POA: Diagnosis not present

## 2020-05-23 DIAGNOSIS — I10 Essential (primary) hypertension: Secondary | ICD-10-CM | POA: Diagnosis not present

## 2020-05-23 DIAGNOSIS — Z299 Encounter for prophylactic measures, unspecified: Secondary | ICD-10-CM | POA: Diagnosis not present

## 2020-05-23 DIAGNOSIS — I700 Atherosclerosis of aorta: Secondary | ICD-10-CM | POA: Diagnosis not present

## 2020-05-23 DIAGNOSIS — N183 Chronic kidney disease, stage 3 unspecified: Secondary | ICD-10-CM | POA: Diagnosis not present

## 2020-06-13 DIAGNOSIS — H35363 Drusen (degenerative) of macula, bilateral: Secondary | ICD-10-CM | POA: Diagnosis not present

## 2020-06-15 ENCOUNTER — Ambulatory Visit
Admission: EM | Admit: 2020-06-15 | Discharge: 2020-06-15 | Disposition: A | Discharge status: Home / Self Care | Payer: Medicare Other | Attending: Emergency Medicine | Admitting: Emergency Medicine

## 2020-06-15 ENCOUNTER — Encounter: Payer: Self-pay | Admitting: Emergency Medicine

## 2020-06-15 DIAGNOSIS — H6122 Impacted cerumen, left ear: Secondary | ICD-10-CM | POA: Diagnosis not present

## 2020-06-15 MED ORDER — FLUTICASONE PROPIONATE 50 MCG/ACT NA SUSP: 1.0000 | Freq: Every day | NASAL | 0 refills | Status: DC

## 2020-06-15 NOTE — Discharge Instructions (Addendum)
  Rest and drink plenty of fluids Use OTC Debrox to clean earwax Follow-up with PCP Take medications as directed and to completion Continue to use OTC ibuprofen and/ or tylenol as needed for pain control Follow up with PCP if symptoms persists Return here or go to the ER if you have any new or worsening symptom

## 2020-06-15 NOTE — ED Provider Notes (Signed)
[facility]   [id number] 06/15/20 Arrival Time: 1022  CC:EAR PAIN  SUBJECTIVE: History from: patient.  Timothy Barton is a 69 y.o. male who presented to the urgent care with a complaint of left ear feeling stopped for the past 1 week.  Denies a precipitating event, such as swimming or wearing ear plugs.  Denies ear pain..  Denies alleviating or aggravating factor.  Report similar symptoms in the past .    Denies fever, chills, fatigue, sinus pain, rhinorrhea, ear discharge, sore throat, SOB, wheezing, chest pain, nausea, changes in bowel or bladder habits.    ROS: As per HPI.  All other pertinent ROS negative.      Past Medical History:  Diagnosis Date  . A-fib (HCC)   . Arthritis   . Hypertension   . Osteoarthritis of right hip 07/05/2012  . Sleep apnea    Past Surgical History:  Procedure Laterality Date  . arthrosopy    . BACK SURGERY  2006    ruptured disc,and repair of leak x3  . CARPAL TUNNEL RELEASE  2002   right and left  . KNEE ARTHROSCOPY  2006   right  . TOTAL HIP ARTHROPLASTY  07/03/2012   Procedure: TOTAL HIP ARTHROPLASTY;  Surgeon: Thera Flake., MD;  Location: [facility] OR;  Service: Orthopedics;  Laterality: Right;  with autograft  . TOTAL HIP ARTHROPLASTY Left 11/04/2014   Procedure: TOTAL HIP ARTHROPLASTY;  Surgeon: Frederico Hamman, MD;  Location: [facility] OR;  Service: Orthopedics;  Laterality: Left;   Allergies  Allergen Reactions  . Tape     Use paper tape only   No current facility-administered medications on file prior to encounter.   Current Outpatient Medications on File Prior to Encounter  Medication Sig Dispense Refill  . diclofenac (VOLTAREN) 75 MG EC tablet     . Glucosamine-Chondroitin 1500-1200 MG/30ML LIQD Take by mouth.    . methocarbamol (ROBAXIN-750) 750 MG tablet Take 1 tablet (750 mg total) by mouth 4 (four) times daily. 60 tablet 0  . metoprolol tartrate (LOPRESSOR) 25 MG tablet     . Misc Natural Products (GLUCOS-CHONDROIT-MSM  COMPLEX PO) Take 2 tablets by mouth daily.    . Omega-3 Fatty Acids (FISH OIL) 1360 MG CAPS Take 2 capsules by mouth daily.     Marland Kitchen oxyCODONE (ROXICODONE) 5 MG immediate release tablet 1-2 tabs po q4-6hrs prn pain 120 tablet 0  . valsartan-hydrochlorothiazide (DIOVAN-HCT) 320-12.5 MG per tablet Take 1 tablet by mouth daily.     Social History   Socioeconomic History  . Marital status: Married    Spouse name: Not on file  . Number of children: 1  . Years of education: 73  . Highest education level: Not on file  Occupational History  . Occupation: Retired  Tobacco Use  . Smoking status: Former Smoker    Packs/day: 1.50    Years: 21.00    Pack years: 31.50    Quit date: 07/01/1998    Years since quitting: 21.9  . Smokeless tobacco: Former Neurosurgeon    Types: Chew    Quit date: 07/01/1982  Substance and Sexual Activity  . Alcohol use: No    Alcohol/week: 0.0 standard drinks  . Drug use: No  . Sexual activity: Not Currently  Other Topics Concern  . Not on file  Social History Narrative   Drinks 1 cup of coffee a day, occasional soda   Social Determinants of Health   Financial Resource Strain: Not on file  Food  Insecurity: Not on file  Transportation Needs: Not on file  Physical Activity: Not on file  Stress: Not on file  Social Connections: Not on file  Intimate Partner Violence: Not on file   Family History  Problem Relation Age of Onset  . Stroke Mother     OBJECTIVE:  Vitals:   06/15/20 1108  BP: 124/69  Pulse: (!) 49  Resp: 18  Temp: 98.6 F (37 C)  TempSrc: Oral  SpO2: 95%     Physical Exam Vitals and nursing note reviewed.  Constitutional:      General: He is not in acute distress.    Appearance: Normal appearance. He is normal weight. He is not ill-appearing, toxic-appearing or diaphoretic.  HENT:     Right Ear: Tympanic membrane, ear canal and external ear normal. There is no impacted cerumen.     Left Ear: There is impacted cerumen.     Ears:      Comments: Left ear post ear lavage: Ear canal and TM within normal limits Cardiovascular:     Rate and Rhythm: Normal rate and regular rhythm.     Pulses: Normal pulses.     Heart sounds: Normal heart sounds. No murmur heard. No friction rub. No gallop.   Pulmonary:     Effort: Pulmonary effort is normal. No respiratory distress.     Breath sounds: Normal breath sounds. No stridor. No wheezing, rhonchi or rales.  Chest:     Chest wall: No tenderness.  Neurological:     Mental Status: He is alert and oriented to person, place, and time.      Imaging: No results found.   ASSESSMENT & PLAN:  1. Impacted cerumen of left ear     Meds ordered this encounter  Medications  . fluticasone (FLONASE) 50 MCG/ACT nasal spray    Sig: Place 1 spray into both nostrils daily for 14 days.    Dispense:  16 g    Refill:  0    Discharge instructions  Rest and drink plenty of fluids Use OTC Debrox to clean earwax Follow-up with PCP Take medications as directed and to completion Continue to use OTC ibuprofen and/ or tylenol as needed for pain control Follow up with PCP if symptoms persists Return here or go to the ER if you have any new or worsening symptoms   Reviewed expectations re: course of current medical issues. Questions answered. Outlined signs and symptoms indicating need for more acute intervention. Patient verbalized understanding. After Visit Summary given.         Emerson Monte, [place] 06/15/20 1152

## 2020-06-15 NOTE — ED Triage Notes (Signed)
LT ear feels stopped up x 1week

## 2020-06-30 ENCOUNTER — Ambulatory Visit (INDEPENDENT_AMBULATORY_CARE_PROVIDER_SITE_OTHER): Payer: Medicare Other | Admitting: Otolaryngology

## 2020-07-11 DIAGNOSIS — J069 Acute upper respiratory infection, unspecified: Secondary | ICD-10-CM | POA: Diagnosis not present

## 2020-07-11 DIAGNOSIS — M791 Myalgia, unspecified site: Secondary | ICD-10-CM | POA: Diagnosis not present

## 2020-07-11 DIAGNOSIS — Z20822 Contact with and (suspected) exposure to covid-19: Secondary | ICD-10-CM | POA: Diagnosis not present

## 2020-09-15 DIAGNOSIS — K7460 Unspecified cirrhosis of liver: Secondary | ICD-10-CM | POA: Diagnosis not present

## 2020-09-15 DIAGNOSIS — I10 Essential (primary) hypertension: Secondary | ICD-10-CM | POA: Diagnosis not present

## 2020-09-15 DIAGNOSIS — Z789 Other specified health status: Secondary | ICD-10-CM | POA: Diagnosis not present

## 2020-09-15 DIAGNOSIS — R109 Unspecified abdominal pain: Secondary | ICD-10-CM | POA: Diagnosis not present

## 2020-09-15 DIAGNOSIS — J449 Chronic obstructive pulmonary disease, unspecified: Secondary | ICD-10-CM | POA: Diagnosis not present

## 2020-09-15 DIAGNOSIS — Z299 Encounter for prophylactic measures, unspecified: Secondary | ICD-10-CM | POA: Diagnosis not present

## 2020-09-15 DIAGNOSIS — N183 Chronic kidney disease, stage 3 unspecified: Secondary | ICD-10-CM | POA: Diagnosis not present

## 2020-10-09 DIAGNOSIS — H6980 Other specified disorders of Eustachian tube, unspecified ear: Secondary | ICD-10-CM | POA: Diagnosis not present

## 2020-10-09 DIAGNOSIS — Z79899 Other long term (current) drug therapy: Secondary | ICD-10-CM | POA: Diagnosis not present

## 2020-10-09 DIAGNOSIS — M1710 Unilateral primary osteoarthritis, unspecified knee: Secondary | ICD-10-CM | POA: Diagnosis not present

## 2020-10-09 DIAGNOSIS — Z0000 Encounter for general adult medical examination without abnormal findings: Secondary | ICD-10-CM | POA: Diagnosis not present

## 2020-10-09 DIAGNOSIS — Z7189 Other specified counseling: Secondary | ICD-10-CM | POA: Diagnosis not present

## 2020-10-09 DIAGNOSIS — E7800 Pure hypercholesterolemia, unspecified: Secondary | ICD-10-CM | POA: Diagnosis not present

## 2020-10-09 DIAGNOSIS — R5383 Other fatigue: Secondary | ICD-10-CM | POA: Diagnosis not present

## 2020-10-09 DIAGNOSIS — Z299 Encounter for prophylactic measures, unspecified: Secondary | ICD-10-CM | POA: Diagnosis not present

## 2020-10-09 DIAGNOSIS — I10 Essential (primary) hypertension: Secondary | ICD-10-CM | POA: Diagnosis not present

## 2020-12-13 DIAGNOSIS — M545 Low back pain, unspecified: Secondary | ICD-10-CM | POA: Diagnosis not present

## 2020-12-13 DIAGNOSIS — R109 Unspecified abdominal pain: Secondary | ICD-10-CM | POA: Diagnosis not present

## 2020-12-13 DIAGNOSIS — Z299 Encounter for prophylactic measures, unspecified: Secondary | ICD-10-CM | POA: Diagnosis not present

## 2020-12-13 DIAGNOSIS — M4184 Other forms of scoliosis, thoracic region: Secondary | ICD-10-CM | POA: Diagnosis not present

## 2020-12-13 DIAGNOSIS — N183 Chronic kidney disease, stage 3 unspecified: Secondary | ICD-10-CM | POA: Diagnosis not present

## 2020-12-13 DIAGNOSIS — I10 Essential (primary) hypertension: Secondary | ICD-10-CM | POA: Diagnosis not present

## 2020-12-13 DIAGNOSIS — J90 Pleural effusion, not elsewhere classified: Secondary | ICD-10-CM | POA: Diagnosis not present

## 2020-12-13 DIAGNOSIS — K7460 Unspecified cirrhosis of liver: Secondary | ICD-10-CM | POA: Diagnosis not present

## 2020-12-13 DIAGNOSIS — J984 Other disorders of lung: Secondary | ICD-10-CM | POA: Diagnosis not present

## 2020-12-13 DIAGNOSIS — G4730 Sleep apnea, unspecified: Secondary | ICD-10-CM | POA: Diagnosis not present

## 2020-12-13 DIAGNOSIS — R911 Solitary pulmonary nodule: Secondary | ICD-10-CM | POA: Diagnosis not present

## 2020-12-13 DIAGNOSIS — M47814 Spondylosis without myelopathy or radiculopathy, thoracic region: Secondary | ICD-10-CM | POA: Diagnosis not present

## 2020-12-15 DIAGNOSIS — R161 Splenomegaly, not elsewhere classified: Secondary | ICD-10-CM | POA: Diagnosis not present

## 2020-12-15 DIAGNOSIS — K8020 Calculus of gallbladder without cholecystitis without obstruction: Secondary | ICD-10-CM | POA: Diagnosis not present

## 2020-12-15 DIAGNOSIS — R590 Localized enlarged lymph nodes: Secondary | ICD-10-CM | POA: Diagnosis not present

## 2020-12-15 DIAGNOSIS — K7460 Unspecified cirrhosis of liver: Secondary | ICD-10-CM | POA: Diagnosis not present

## 2020-12-22 DIAGNOSIS — I10 Essential (primary) hypertension: Secondary | ICD-10-CM | POA: Diagnosis not present

## 2020-12-22 DIAGNOSIS — R161 Splenomegaly, not elsewhere classified: Secondary | ICD-10-CM | POA: Diagnosis not present

## 2020-12-22 DIAGNOSIS — R109 Unspecified abdominal pain: Secondary | ICD-10-CM | POA: Diagnosis not present

## 2020-12-22 DIAGNOSIS — Z299 Encounter for prophylactic measures, unspecified: Secondary | ICD-10-CM | POA: Diagnosis not present

## 2020-12-26 DIAGNOSIS — R911 Solitary pulmonary nodule: Secondary | ICD-10-CM | POA: Diagnosis not present

## 2020-12-26 DIAGNOSIS — I700 Atherosclerosis of aorta: Secondary | ICD-10-CM | POA: Diagnosis not present

## 2020-12-26 DIAGNOSIS — R9389 Abnormal findings on diagnostic imaging of other specified body structures: Secondary | ICD-10-CM | POA: Diagnosis not present

## 2020-12-26 DIAGNOSIS — R079 Chest pain, unspecified: Secondary | ICD-10-CM | POA: Diagnosis not present

## 2020-12-26 DIAGNOSIS — J9811 Atelectasis: Secondary | ICD-10-CM | POA: Diagnosis not present

## 2020-12-26 DIAGNOSIS — J90 Pleural effusion, not elsewhere classified: Secondary | ICD-10-CM | POA: Diagnosis not present

## 2020-12-29 DIAGNOSIS — C7889 Secondary malignant neoplasm of other digestive organs: Secondary | ICD-10-CM | POA: Diagnosis not present

## 2020-12-29 DIAGNOSIS — C786 Secondary malignant neoplasm of retroperitoneum and peritoneum: Secondary | ICD-10-CM | POA: Diagnosis not present

## 2020-12-29 DIAGNOSIS — K7460 Unspecified cirrhosis of liver: Secondary | ICD-10-CM | POA: Diagnosis not present

## 2020-12-29 DIAGNOSIS — R161 Splenomegaly, not elsewhere classified: Secondary | ICD-10-CM | POA: Diagnosis not present

## 2020-12-29 DIAGNOSIS — C7989 Secondary malignant neoplasm of other specified sites: Secondary | ICD-10-CM | POA: Diagnosis not present

## 2020-12-29 DIAGNOSIS — K7689 Other specified diseases of liver: Secondary | ICD-10-CM | POA: Diagnosis not present

## 2020-12-29 DIAGNOSIS — J90 Pleural effusion, not elsewhere classified: Secondary | ICD-10-CM | POA: Diagnosis not present

## 2020-12-29 DIAGNOSIS — R160 Hepatomegaly, not elsewhere classified: Secondary | ICD-10-CM | POA: Diagnosis not present

## 2020-12-29 DIAGNOSIS — C772 Secondary and unspecified malignant neoplasm of intra-abdominal lymph nodes: Secondary | ICD-10-CM | POA: Diagnosis not present

## 2021-01-04 ENCOUNTER — Telehealth: Payer: Self-pay | Admitting: Nurse Practitioner

## 2021-01-04 ENCOUNTER — Other Ambulatory Visit: Payer: Self-pay

## 2021-01-04 DIAGNOSIS — R160 Hepatomegaly, not elsewhere classified: Secondary | ICD-10-CM

## 2021-01-04 NOTE — Progress Notes (Signed)
I called [facility] radiology department requesting images of Mr Blank, CT chest dated 12/26/2020 and MR abdomen 12/29/2020.  They are unable to push to [employer], they will send the images on disc to the attention of Dr Burr Medico.

## 2021-01-04 NOTE — Telephone Encounter (Signed)
Received a new patient referral from [facility] for liver mass. Mr. Timothy Barton has been scheduled to see lacie on 7/20 at 11am. Appt date and time has been given to [facility] at [facility] who will call the pt w/the appt date and time.

## 2021-01-04 NOTE — Progress Notes (Signed)
Error

## 2021-01-09 ENCOUNTER — Inpatient Hospital Stay
Admission: RE | Admit: 2021-01-09 | Discharge: 2021-01-09 | Disposition: A | Discharge status: Home / Self Care | Payer: Self-pay | Source: Ambulatory Visit | Attending: Hematology | Admitting: Hematology

## 2021-01-09 ENCOUNTER — Other Ambulatory Visit (HOSPITAL_COMMUNITY): Payer: Self-pay | Admitting: Hematology

## 2021-01-09 DIAGNOSIS — C801 Malignant (primary) neoplasm, unspecified: Secondary | ICD-10-CM

## 2021-01-09 NOTE — Progress Notes (Addendum)
[place]   Telephone:([phone] Fax:([phone]) [place] Note   Patient Care Team: Medicine, [facility] as PCP - General (Internal Medicine) Truitt Merle, MD as Consulting Physician (Oncology) Royston Bake, RN as Oncology Nurse Navigator (Oncology) Date of Service: 01/10/2021  CHIEF COMPLAINTS/PURPOSE OF CONSULTATION:  Liver mass, referred by PCP Dr. Woody Seller  HISTORY OF PRESENTING ILLNESS:  Timothy Barton 70 y.o. male with PMH including obesity, HTN, OSA, OA, non-alcohol fatty liver cirrhosis and elevated LFTs is here because of liver masses and abnormal CT concerning for cancer.  Last colonoscopy 09/2019 normal per PCP notes at [place].  He developed sudden onset cramping RUQ pain.  He underwent abdominal ultrasound that showed 2 hepatic masses.  Abdominal MRI with and without contrast on 12/29/2020 showed cirrhotic enlarged liver with a rim-enhancing lesion in the anterior right lobe measuring 3.8 cm and a larger lobular mass along the medial border of the right lobe measuring 8.1 x 6.7 cm felt to be consistent with multifocal hepatocellular carcinoma, as well as extensive retroperitoneal, porta hepatis, peripancreatic, and paraesophageal adenopathy, including a 7.3 cm node in the left suprarenal retroperitoneum, a 4.1 cm deep to the IVC Additionally there is a soft tissue lesion to the left paraspinal musculature, overall concerning for metastatic disease.  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  EGD for RUQ pain 04/08/2000 by Dr. Henrene Pastor was normal. He was referred to oncology for further evaluation and management.  Socially, he is married and lives with his wife.  He has a 79 year old daughter, healthy.  He is retired from Conservation officer, nature.  Independent with ADLs.  He denies alcohol or  drug use.  He is a former cigarette smoker 1.5 packs/day for 24 years, quit 30 years ago.  He denies family history of any cancer.  [name] presents with his wife, in a wheelchair.  He has 1 month history of significant abdominal bloating and pain.  He has an appetite but limited p.o. due to early satiety.  He intentionally lost 105 pounds during COVID on a keto diet.  He weighed 238 pounds 6 months ago, up to 266 today.  He has leg edema.  Due to abdominal bloating he has cough and dyspnea.  Sleeps in a recliner.  Denies nausea/vomiting, constipation, diarrhea.  He had 1 episode of rectal bleeding recently he attributes to a ruptured hemorrhoid.    MEDICAL HISTORY:  Past Medical History:  Diagnosis Date   A-fib [facility])    Arthritis    Hypertension    Osteoarthritis of right hip 07/05/2012   Sleep apnea     SURGICAL HISTORY: Past Surgical History:  Procedure Laterality Date   arthrosopy     BACK SURGERY  2006    ruptured disc,and repair of leak x3   CARPAL TUNNEL RELEASE  2002   right and left   KNEE ARTHROSCOPY  2006   right   TOTAL HIP ARTHROPLASTY  07/03/2012   Procedure: TOTAL HIP ARTHROPLASTY;  Surgeon: Yvette Rack., MD;  Location: [place];  Service: Orthopedics;  Laterality: Right;  with autograft   TOTAL HIP ARTHROPLASTY Left 11/04/2014   Procedure: TOTAL HIP ARTHROPLASTY;  Surgeon: Earlie Server, MD;  Location: [place];  Service: Orthopedics;  Laterality: Left;    SOCIAL HISTORY: Social History   Socioeconomic History   Marital status:  Married    Spouse name: Not on file   Number of children: 1   Years of education: 25   Highest education level: Not on file  Occupational History   Occupation: Retired  Tobacco Use   Smoking status: Former    Packs/day: 1.50    Years: 21.00    Pack years: 31.50    Types: Cigarettes    Quit date: 07/01/1998    Years since quitting: 22.5   Smokeless tobacco: Former    Types: Chew    Quit date: 07/01/1982  Substance and Sexual  Activity   Alcohol use: No    Alcohol/week: 0.0 standard drinks   Drug use: No   Sexual activity: Not Currently  Other Topics Concern   Not on file  Social History Narrative   Drinks 1 cup of coffee a day, occasional soda   Social Determinants of Radio broadcast assistant Strain: Not on file  Food Insecurity: Not on file  Transportation Needs: Not on file  Physical Activity: Not on file  Stress: Not on file  Social Connections: Not on file  Intimate Partner Violence: Not on file    FAMILY HISTORY: Family History  Problem Relation Age of Onset   Stroke Mother     ALLERGIES:  is allergic to tape.  MEDICATIONS:  Current Outpatient Medications  Medication Sig Dispense Refill   aspirin EC 81 MG tablet Take 81 mg by mouth daily. Swallow whole.     Glucosamine-Chondroitin 1500-1200 MG/30ML LIQD Take by mouth.     losartan-hydrochlorothiazide (HYZAAR) 100-12.5 MG tablet Take 1 tablet by mouth daily.     meloxicam (MOBIC) 15 MG tablet Take 15 mg by mouth daily.     metoprolol succinate (TOPROL-XL) 50 MG 24 hr tablet Take 50 mg by mouth daily.     Misc Natural Products (GLUCOS-CHONDROIT-MSM COMPLEX PO) Take 2 tablets by mouth daily.     Omega-3 Fatty Acids (FISH OIL) 1360 MG CAPS Take 2 capsules by mouth daily.      No current facility-administered medications for this visit.    REVIEW OF SYSTEMS:   Constitutional: Denies fevers, chills or abnormal night sweats Eyes: Denies blurriness of vision, double vision or watery eyes Ears, nose, mouth, throat, and face: Denies mucositis or sore throat Respiratory: Denies wheezes (+) cough (+) dyspnea Cardiovascular: Denies palpitation, chest discomfort (+) lower extremity swelling Gastrointestinal:  Denies nausea, heartburn or change in bowel habits (+) bloating (+) abdominal pain (+) early satiety Skin: Denies abnormal skin rashes Lymphatics: Denies new lymphadenopathy or easy bruising Neurological:Denies numbness, tingling or new  weaknesses Behavioral/Psych: Mood is stable, no new changes  All other systems were reviewed with the patient and are negative.  PHYSICAL EXAMINATION: ECOG PERFORMANCE STATUS: 2 - Symptomatic, <50% confined to bed  Vitals:   01/10/21 1049  BP: (!) 107/52  Pulse: 67  Resp: 18  Temp: 98.5 F (36.9 C)  SpO2: 95%   Filed Weights   01/10/21 1049  Weight: 266 lb 5 oz (120.8 kg)    GENERAL:alert, no distress and comfortable SKIN: dry, no rash  EYES: sclera clear NECK: Fullness in the left supraclavicular area, no discrete mass LUNGS: Decreased right base, otherwise clear with normal breathing effort HEART: regular rate & rhythm, mild bilateral lower extremity edema ABDOMEN:abdomen distended, diffuse tenderness throughout.  Normal bowel sounds Musculoskeletal: Palpable soft tissue mass in the left mid back near spine  PSYCH: alert & oriented x 3 with fluent speech NEURO: Limited mobility.  LABORATORY DATA:  I have reviewed the data as listed CBC Latest Ref Rng & Units 01/10/2021 11/06/2014 11/05/2014  WBC 4.0 - 10.5 K/uL 6.8 9.2 9.4  Hemoglobin 13.0 - 17.0 g/dL 10.8(L) 11.1(L) 12.1(L)  Hematocrit 39.0 - 52.0 % 32.9(L) 33.5(L) 37.1(L)  Platelets 150 - 400 K/uL 113(L) 89(L) 116(L)   CMP Latest Ref Rng & Units 01/10/2021 11/05/2014 11/04/2014  Glucose 70 - 99 mg/dL 108(H) 137(H) 167(H)  BUN 8 - 23 mg/dL 30(H) 18 -  Creatinine 0.61 - 1.24 mg/dL 1.15 1.04 -  Sodium 135 - 145 mmol/L 142 134(L) 135  Potassium 3.5 - 5.1 mmol/L 3.9 4.1 4.5  Chloride 98 - 111 mmol/L 103 98(L) -  CO2 22 - 32 mmol/L 29 29 -  Calcium 8.9 - 10.3 mg/dL 9.4 8.4(L) -  Total Protein 6.5 - 8.1 g/dL 6.4(L) - -  Total Bilirubin 0.3 - 1.2 mg/dL 0.8 - -  Alkaline Phos 38 - 126 U/L 63 - -  AST 15 - 41 U/L 22 - -  ALT 0 - 44 U/L 15 - -     CMP Latest Ref Rng & Units 01/10/2021 11/05/2014 11/04/2014  Glucose 70 - 99 mg/dL 108(H) 137(H) 167(H)  BUN 8 - 23 mg/dL 30(H) 18 -  Creatinine 0.61 - 1.24 mg/dL 1.15 1.04 -   Sodium 135 - 145 mmol/L 142 134(L) 135  Potassium 3.5 - 5.1 mmol/L 3.9 4.1 4.5  Chloride 98 - 111 mmol/L 103 98(L) -  CO2 22 - 32 mmol/L 29 29 -  Calcium 8.9 - 10.3 mg/dL 9.4 8.4(L) -  Total Protein 6.5 - 8.1 g/dL 6.4(L) - -  Total Bilirubin 0.3 - 1.2 mg/dL 0.8 - -  Alkaline Phos 38 - 126 U/L 63 - -  AST 15 - 41 U/L 22 - -  ALT 0 - 44 U/L 15 - -    RADIOGRAPHIC STUDIES: I have personally reviewed the radiological images as listed and agreed with the findings in the report. US Abdomen Limited  Result Date: 01/10/2021 CLINICAL DATA:  Liver lesions, cirrhosis EXAM: LIMITED ABDOMEN ULTRASOUND FOR ASCITES TECHNIQUE: Limited ultrasound survey for ascites was performed in all four abdominal quadrants. COMPARISON:  MR 12/29/2020 FINDINGS: Scattered small-volume abdominal ascites as before. Right lobe liver mass as described on previous MR. IMPRESSION: Small volume abdominal ascites Electronically Signed   By: Lucrezia Europe M.D.   On: 01/10/2021 15:03   CT OUTSIDE FILMS CHEST  Result Date: 01/09/2021 This examination belongs to an outside facility and is stored here for comparison purposes only.  Contact the originating outside institution for any associated report or interpretation.   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 He is symptomatic from bloating/ascites.  -We reviewed his medical record and work up including [place], CT and MRI which shows 2 large liver masses measuring 3.8 and 8.1 cm with image findings consistent with multifocal HCC. Unfortunately, work up also showed extensive bulky abdominal and retroperitoneal metastasis. A soft tissue deposit in the left paraspinal muscle is palpable on exam -we are referring him for PET scan and biopsy of extrahepatic site to confirm metastatic [place]. Baseline AFP is pending. -Given this is metastatic  disease, we reviewed he is not a candidate for surgical resection, liver transplant, or targeted therapy. We dicussed unfortunately this is not curable at this stage.  -We discussed treatment options including first  line immunotherapy atezolizumab and VGFR inhibitor bevacizumab q3 weeks vs oral TKI. Dr. Burr Medico recommends atezo/beva.  -Consent - atezolizumab: side effects including but not limited to fatigue, rash, pneumonitis/cough, colitis/diarrhea, and thyroid dysfunction were reviewed. -consent - bevacizumab : side effects including but not limited to proteinuria, HTN, thrombosis, and bleeding were reviewed.  -patient agrees to proceed. We discussed the palliative goal of therapy which is to improve his symptoms, improve quality of life, and prolong his life. -We are referring him to [facility] for EGD for varices eval/management, given the CT showed extensive abdominal varices.  -Baseline labs show mild thrombocytopenia plt 113K secondary to cirrhosis, and anemia Hg 10.8, secondary to metastatic cancer and possible anemia of chronic disease. CMP shows albumin 2.8, normal LFTs and bili. His liver function is adequate. AFP is pending.  -we plan to see him back after work up and to start treatment in 1-2 weeks, will hold beva for C1 while EGD is getting done.   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 - Abdomen is distended -Stat abd US shows small volume ascites, likely not enough for paracentesis.  -will start tramadol PRN for pain  3. Goals of care -He and his wife understand his disease is metastatic and not curable, but still treatable. Goal is palliative  -they wish not to know his prognosis or life extectancy, as they  feel it may have negative impact on his mental state.  -Their only child 62 year old daughter is aware of the diagnosis but they also wish to keep her uninformed of his prognosis.  -They have strong faith in God and their medical team  PLAN: -Work up reviewed -PET scan and biopsy in the next week -Referral to [facility] for EGD (varices eval and management) -Labs and stat abd [place] today -Rx: tramadol PRN -Follow up to review results and start treatment in 1-2 weeks     Orders Placed This Encounter  Procedures   NM PET Image Initial (PI) Skull Base To Thigh    Standing Status:   Future    Standing Expiration Date:   01/10/2022    Order Specific Question:   If indicated for the ordered procedure, I authorize the administration of a radiopharmaceutical per Radiology protocol    Answer:   Yes    Order Specific Question:   Preferred imaging location?    Answer:   [facility]   US Abdomen Limited    Standing Status:   Future    Number of Occurrences:   1    Standing Expiration Date:   01/10/2022    Order Specific Question:   Reason for Exam (SYMPTOM  OR DIAGNOSIS REQUIRED)    Answer:   evaluate ascites    Order Specific Question:   Preferred imaging location?    Answer:   [facility]    Order Specific Question:   Call Results- Best Contact Number?    Answer:   [phone]. after 5pm [phone]- hold pt   CT Biopsy    Standing Status:   Future    Standing Expiration Date:   01/11/2022    Order Specific Question:   Lab orders requested (DO NOT place separate lab orders, these will be automatically ordered  during procedure specimen collection):    Answer:   Surgical Pathology    Order Specific Question:   Reason for Exam (SYMPTOM  OR DIAGNOSIS REQUIRED)    Answer:   confirm metastasis    Order Specific Question:   Preferred location?    Answer:   [facility]   CBC with Differential (Cancer Center Only)    Standing Status:   Standing    Number of Occurrences:   50     Standing Expiration Date:   01/10/2022   CMP ([place] only)    Standing Status:   Standing    Number of Occurrences:   50    Standing Expiration Date:   01/10/2022   AFP tumor marker    Standing Status:   Standing    Number of Occurrences:   20    Standing Expiration Date:   01/10/2022   Ambulatory referral to Gastroenterology    Referral Priority:   Routine    Referral Type:   Consultation    Referral Reason:   Specialty Services Required    Number of Visits Requested:   1     All questions were answered. The patient knows to call the clinic with any problems, questions or concerns. No barriers to learning were detected.     Alla Feeling, NP 01/11/2021    Addendum  I have seen the patient, examined him. I agree with the assessment and and plan and have edited the notes.   70 yo male with PMH of nonalcoholic fatty liver and liver cirrhosis, presented with right upper quadrant abdominal pain.  Work-up including ultrasound, CT and MRI showed 2 liver lesions, largest 8.1 cm in the right lobe, bulky abdominal and retroperitoneal adenopathy, largest 7.3 cm.  Her MRI findings was consistent with multifocal HCC.  AFP level unknown.  His image findings are highly suspicious for metastatic HCC.  Will obtain PET for further evaluation and I recommend IR biopsy of one of the extrahepatic lesion to confirm metastasis.  I discussed the natural history of metastatic HCC, and incurable nature of the disease.  I recommend systemic treatment, we discussed the option of immunotherapy, combination of Tecentriq and bevacizumab, and TKI's such as lenvatinib.  Plan to start Tecentriq and bevacizumab in the next 1 to 2 weeks, will hold bevacizumab for first cycle while we are waiting for his EGD evaluation of varices.  We will refer him to low-power GI in [place].  Patient's main concern is his abdominal bloating, will schedule abdominal ultrasound to evaluate ascites to see if he needs a paracentesis.  We  will obtain baseline lab today.  All questions were answered  Truitt Merle MD 7/202/2022

## 2021-01-10 ENCOUNTER — Other Ambulatory Visit: Payer: Self-pay

## 2021-01-10 ENCOUNTER — Inpatient Hospital Stay: Payer: Medicare Other | Attending: Nurse Practitioner | Admitting: Nurse Practitioner

## 2021-01-10 ENCOUNTER — Inpatient Hospital Stay: Payer: Medicare Other

## 2021-01-10 ENCOUNTER — Ambulatory Visit (HOSPITAL_COMMUNITY)
Admission: RE | Admit: 2021-01-10 | Discharge: 2021-01-10 | Disposition: A | Discharge status: Home / Self Care | Payer: Medicare Other | Source: Ambulatory Visit | Attending: Hematology | Admitting: Hematology

## 2021-01-10 VITALS — BP 107/52 | HR 67 | Temp 98.5°F | Resp 18 | Wt 266.3 lb

## 2021-01-10 DIAGNOSIS — R160 Hepatomegaly, not elsewhere classified: Secondary | ICD-10-CM | POA: Diagnosis not present

## 2021-01-10 DIAGNOSIS — Z87891 Personal history of nicotine dependence: Secondary | ICD-10-CM | POA: Insufficient documentation

## 2021-01-10 DIAGNOSIS — R932 Abnormal findings on diagnostic imaging of liver and biliary tract: Secondary | ICD-10-CM | POA: Insufficient documentation

## 2021-01-10 DIAGNOSIS — R188 Other ascites: Secondary | ICD-10-CM | POA: Diagnosis not present

## 2021-01-10 LAB — CBC WITH DIFFERENTIAL (CANCER CENTER ONLY)
Abs Immature Granulocytes: 0.02 10*3/uL (ref 0.00–0.07)
Basophils Absolute: 0 10*3/uL (ref 0.0–0.1)
Basophils Relative: 0 %
Eosinophils Absolute: 0 10*3/uL (ref 0.0–0.5)
Eosinophils Relative: 0 %
HCT: 32.9 % — ABNORMAL LOW (ref 39.0–52.0)
Hemoglobin: 10.8 g/dL — ABNORMAL LOW (ref 13.0–17.0)
Immature Granulocytes: 0 %
Lymphocytes Relative: 9 %
Lymphs Abs: 0.6 10*3/uL — ABNORMAL LOW (ref 0.7–4.0)
MCH: 29.2 pg (ref 26.0–34.0)
MCHC: 32.8 g/dL (ref 30.0–36.0)
MCV: 88.9 fL (ref 80.0–100.0)
Monocytes Absolute: 0.5 10*3/uL (ref 0.1–1.0)
Monocytes Relative: 7 %
Neutro Abs: 5.6 10*3/uL (ref 1.7–7.7)
Neutrophils Relative %: 84 %
Platelet Count: 113 10*3/uL — ABNORMAL LOW (ref 150–400)
RBC: 3.7 MIL/uL — ABNORMAL LOW (ref 4.22–5.81)
RDW: 14.3 % (ref 11.5–15.5)
WBC Count: 6.8 10*3/uL (ref 4.0–10.5)
nRBC: 0 % (ref 0.0–0.2)

## 2021-01-10 LAB — CMP (CANCER CENTER ONLY)
ALT: 15 U/L (ref 0–44)
AST: 22 U/L (ref 15–41)
Albumin: 2.8 g/dL — ABNORMAL LOW (ref 3.5–5.0)
Alkaline Phosphatase: 63 U/L (ref 38–126)
Anion gap: 10 (ref 5–15)
BUN: 30 mg/dL — ABNORMAL HIGH (ref 8–23)
CO2: 29 mmol/L (ref 22–32)
Calcium: 9.4 mg/dL (ref 8.9–10.3)
Chloride: 103 mmol/L (ref 98–111)
Creatinine: 1.15 mg/dL (ref 0.61–1.24)
GFR, Estimated: >60 mL/min
Glucose, Bld: 108 mg/dL — ABNORMAL HIGH (ref 70–99)
Potassium: 3.9 mmol/L (ref 3.5–5.1)
Sodium: 142 mmol/L (ref 135–145)
Total Bilirubin: 0.8 mg/dL (ref 0.3–1.2)
Total Protein: 6.4 g/dL — ABNORMAL LOW (ref 6.5–8.1)

## 2021-01-11 ENCOUNTER — Encounter: Payer: Self-pay | Admitting: Nurse Practitioner

## 2021-01-11 ENCOUNTER — Telehealth: Payer: Self-pay

## 2021-01-11 ENCOUNTER — Other Ambulatory Visit: Payer: Self-pay | Admitting: Hematology

## 2021-01-11 DIAGNOSIS — C787 Secondary malignant neoplasm of liver and intrahepatic bile duct: Secondary | ICD-10-CM | POA: Insufficient documentation

## 2021-01-11 DIAGNOSIS — C801 Malignant (primary) neoplasm, unspecified: Secondary | ICD-10-CM | POA: Insufficient documentation

## 2021-01-11 DIAGNOSIS — C220 Liver cell carcinoma: Secondary | ICD-10-CM

## 2021-01-11 LAB — AFP TUMOR MARKER: AFP, Serum, Tumor Marker: 1.1 ng/mL (ref 0.0–8.4)

## 2021-01-11 MED ORDER — TRAMADOL HCL 50 MG PO TABS: 50.0000 mg | ORAL_TABLET | Freq: Four times a day (QID) | ORAL | 0 refills | Status: AC | PRN

## 2021-01-11 NOTE — Telephone Encounter (Signed)
This nurse spoke with patient's wife who stated prescription for Tramadol was not called in on 7/20.  Also the PET scan has been scheduled for 8/9 and she wanted to see if the date can be moved sooner.  This nurse reached out to [employer] [facility] and was informed that there are no earlier appointment dates at this time.  Spouse made aware.  Advised that MD will be made aware, patient and spouse is concerned this will delay his treatment.

## 2021-01-11 NOTE — Progress Notes (Signed)
I was able to move Timothy Barton's PET appt to 7/25 at 0800.  I spoke with his wife, Timothy Barton and relayed this information.  I introduced myself and explained my role as the nurse navigator.  I also relayed to her per Dr Burr Medico, Timothy Kakar's abd u/s yesterday did not show large amount of ascitic fluid, but lymphadenopathy causing his abd discomfort.  I told her we would reach out to them with additional appts. She verbalized understanding.

## 2021-01-11 NOTE — Progress Notes (Signed)
START OFF PATHWAY REGIMEN - Other   [id number]:Atezolizumab 1,200 mg IV D1 + Bevacizumab 15 mg/kg IV D1 q21 Days:   A cycle is every 21 Days:     Atezolizumab      Bevacizumab-xxxx   **Always confirm dose/schedule in your pharmacy ordering system**  Patient Characteristics: Intent of Therapy: Non-Curative / Palliative Intent, Discussed with Patient 

## 2021-01-12 ENCOUNTER — Telehealth: Payer: Self-pay | Admitting: Hematology

## 2021-01-12 ENCOUNTER — Encounter (HOSPITAL_COMMUNITY): Payer: Self-pay | Admitting: Radiology

## 2021-01-12 ENCOUNTER — Other Ambulatory Visit: Payer: Self-pay

## 2021-01-12 DIAGNOSIS — C220 Liver cell carcinoma: Secondary | ICD-10-CM

## 2021-01-12 NOTE — Progress Notes (Signed)
Criselda Peaches, MD  Royston Bake, RN; Alla Feeling, NP; Danne Harbor, MD; Irene Shipper, MD; 2 others Cc:P Ir Procedure Requests; Brenndon, Mathus  MRN [id number]   Imaging does fit the description in the original message.  Approved for US guided bx of left paraspinal soft tissue mass.  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  MRN [id number]   KB  ----- Message -----  From: Alla Feeling, NP  Sent: 01/11/2021   9:58 AM EDT  To: Markus Daft, MD, Aletta Edouard, MD, Lexine Baton, please submit the note today after Dr. Burr Medico signs it, ready now.   IR team, this is a guy with image findings c/w metastatic [place]. We have referred him for PET scan and extrahepatic biopsy to confirm. The left paraspinal deposit is large and palpable on exam, but you may prefer another site, could you please take a look. His outside scans are available in epic.   Dr. Henrene Pastor, you did his last EGD/colonoscopy many years ago, he has been seen at [facility] since then. CT noted upper abdominal varices, could you please repeat EGD for eval/management? I placed a referral. We plan to start bevacizumab after that is done.   Santiago Glad, please help to expedite the process. He became very symptomatic in a month so we will need to start treatment in 1-2 weeks. Will defer to IR if we need to have PET before biopsy.   Thanks everyone,  Regan Rakers

## 2021-01-12 NOTE — Telephone Encounter (Signed)
Scheduled appointment per 07/22 sch msg. Patient is aware. 

## 2021-01-15 ENCOUNTER — Ambulatory Visit (HOSPITAL_COMMUNITY): Payer: Medicare Other

## 2021-01-15 NOTE — Progress Notes (Signed)
Pharmacist Chemotherapy Monitoring - Initial Assessment    Anticipated start date: 01/22/21   The following has been reviewed per standard work regarding the patient's treatment regimen: The patient's diagnosis, treatment plan and drug doses, and organ/hematologic function Lab orders and baseline tests specific to treatment regimen  The treatment plan start date, drug sequencing, and pre-medications Prior authorization status  Patient's documented medication list, including drug-drug interaction screen and prescriptions for anti-emetics and supportive care specific to the treatment regimen The drug concentrations, fluid compatibility, administration routes, and timing of the medications to be used The patient's access for treatment and lifetime cumulative dose history, if applicable  The patient's medication allergies and previous infusion related reactions, if applicable   Changes made to treatment plan:  treatment plan date  Follow up needed:  Pending authorization for treatment     Kennith Center, Pharm.D., CPP 01/15/2021'@1'$ :58 PM

## 2021-01-16 ENCOUNTER — Other Ambulatory Visit: Payer: Self-pay

## 2021-01-16 ENCOUNTER — Other Ambulatory Visit: Payer: Self-pay | Admitting: Radiology

## 2021-01-16 ENCOUNTER — Inpatient Hospital Stay: Payer: Medicare Other

## 2021-01-16 ENCOUNTER — Other Ambulatory Visit: Payer: Self-pay | Admitting: Student

## 2021-01-16 DIAGNOSIS — C220 Liver cell carcinoma: Secondary | ICD-10-CM

## 2021-01-16 MED ORDER — PROCHLORPERAZINE MALEATE 10 MG PO TABS: 10.0000 mg | ORAL_TABLET | Freq: Four times a day (QID) | ORAL | 1 refills | Status: AC | PRN

## 2021-01-16 MED ORDER — LIDOCAINE-PRILOCAINE 2.5-2.5 % EX CREA: 1.0000 "application " | TOPICAL_CREAM | CUTANEOUS | 1 refills | Status: AC | PRN

## 2021-01-16 MED ORDER — ONDANSETRON HCL 8 MG PO TABS: 8.0000 mg | ORAL_TABLET | Freq: Three times a day (TID) | ORAL | 1 refills | Status: AC | PRN

## 2021-01-16 NOTE — Progress Notes (Signed)
I spoke with Timothy Barton and let her know the new PET scan appt date, time, and location.  We reviewed instructions.  She verbalized understanding

## 2021-01-17 ENCOUNTER — Inpatient Hospital Stay (HOSPITAL_BASED_OUTPATIENT_CLINIC_OR_DEPARTMENT_OTHER): Payer: Medicare Other | Admitting: Nurse Practitioner

## 2021-01-17 ENCOUNTER — Ambulatory Visit (HOSPITAL_COMMUNITY)
Admission: RE | Admit: 2021-01-17 | Discharge: 2021-01-17 | Disposition: A | Discharge status: Home / Self Care | Payer: Medicare Other | Source: Ambulatory Visit | Attending: Nurse Practitioner | Admitting: Nurse Practitioner

## 2021-01-17 ENCOUNTER — Other Ambulatory Visit: Payer: Self-pay

## 2021-01-17 ENCOUNTER — Ambulatory Visit (HOSPITAL_COMMUNITY): Admission: RE | Admit: 2021-01-17 | Payer: Medicare Other | Source: Ambulatory Visit

## 2021-01-17 ENCOUNTER — Inpatient Hospital Stay (HOSPITAL_COMMUNITY)
Admission: AD | Admit: 2021-01-17 | Discharge: 2021-01-20 | DRG: 028 | Disposition: A | Discharge status: Home Health | Payer: Medicare Other | Source: Ambulatory Visit | Attending: Internal Medicine | Admitting: Internal Medicine

## 2021-01-17 ENCOUNTER — Inpatient Hospital Stay: Payer: Medicare Other

## 2021-01-17 ENCOUNTER — Other Ambulatory Visit: Payer: Self-pay | Admitting: Hematology

## 2021-01-17 ENCOUNTER — Encounter (HOSPITAL_COMMUNITY): Payer: Self-pay | Admitting: Internal Medicine

## 2021-01-17 ENCOUNTER — Telehealth: Payer: Self-pay

## 2021-01-17 ENCOUNTER — Encounter: Payer: Self-pay | Admitting: Nurse Practitioner

## 2021-01-17 ENCOUNTER — Ambulatory Visit (HOSPITAL_COMMUNITY): Payer: Medicare Other

## 2021-01-17 ENCOUNTER — Observation Stay (HOSPITAL_COMMUNITY): Payer: Medicare Other

## 2021-01-17 VITALS — BP 107/59 | HR 89 | Temp 98.8°F | Resp 19 | Wt 274.6 lb

## 2021-01-17 DIAGNOSIS — D649 Anemia, unspecified: Secondary | ICD-10-CM

## 2021-01-17 DIAGNOSIS — Z823 Family history of stroke: Secondary | ICD-10-CM | POA: Diagnosis not present

## 2021-01-17 DIAGNOSIS — G9341 Metabolic encephalopathy: Secondary | ICD-10-CM | POA: Diagnosis present

## 2021-01-17 DIAGNOSIS — Z20822 Contact with and (suspected) exposure to covid-19: Secondary | ICD-10-CM | POA: Diagnosis present

## 2021-01-17 DIAGNOSIS — C220 Liver cell carcinoma: Secondary | ICD-10-CM

## 2021-01-17 DIAGNOSIS — K7460 Unspecified cirrhosis of liver: Secondary | ICD-10-CM | POA: Diagnosis not present

## 2021-01-17 DIAGNOSIS — C412 Malignant neoplasm of vertebral column: Secondary | ICD-10-CM | POA: Diagnosis not present

## 2021-01-17 DIAGNOSIS — Z79899 Other long term (current) drug therapy: Secondary | ICD-10-CM

## 2021-01-17 DIAGNOSIS — C799 Secondary malignant neoplasm of unspecified site: Secondary | ICD-10-CM | POA: Diagnosis present

## 2021-01-17 DIAGNOSIS — N179 Acute kidney failure, unspecified: Secondary | ICD-10-CM

## 2021-01-17 DIAGNOSIS — M1990 Unspecified osteoarthritis, unspecified site: Secondary | ICD-10-CM | POA: Diagnosis not present

## 2021-01-17 DIAGNOSIS — R4182 Altered mental status, unspecified: Secondary | ICD-10-CM

## 2021-01-17 DIAGNOSIS — R410 Disorientation, unspecified: Secondary | ICD-10-CM | POA: Diagnosis not present

## 2021-01-17 DIAGNOSIS — N390 Urinary tract infection, site not specified: Secondary | ICD-10-CM | POA: Diagnosis not present

## 2021-01-17 DIAGNOSIS — I482 Chronic atrial fibrillation, unspecified: Secondary | ICD-10-CM | POA: Diagnosis present

## 2021-01-17 DIAGNOSIS — Z6841 Body Mass Index (BMI) 40.0 and over, adult: Secondary | ICD-10-CM | POA: Diagnosis not present

## 2021-01-17 DIAGNOSIS — R109 Unspecified abdominal pain: Secondary | ICD-10-CM

## 2021-01-17 DIAGNOSIS — R160 Hepatomegaly, not elsewhere classified: Secondary | ICD-10-CM

## 2021-01-17 DIAGNOSIS — R0902 Hypoxemia: Secondary | ICD-10-CM | POA: Diagnosis not present

## 2021-01-17 DIAGNOSIS — R400 Somnolence: Secondary | ICD-10-CM | POA: Diagnosis not present

## 2021-01-17 DIAGNOSIS — D696 Thrombocytopenia, unspecified: Secondary | ICD-10-CM | POA: Diagnosis not present

## 2021-01-17 DIAGNOSIS — I6523 Occlusion and stenosis of bilateral carotid arteries: Secondary | ICD-10-CM | POA: Diagnosis present

## 2021-01-17 DIAGNOSIS — Z8673 Personal history of transient ischemic attack (TIA), and cerebral infarction without residual deficits: Secondary | ICD-10-CM | POA: Diagnosis not present

## 2021-01-17 DIAGNOSIS — Z7982 Long term (current) use of aspirin: Secondary | ICD-10-CM

## 2021-01-17 DIAGNOSIS — J90 Pleural effusion, not elsewhere classified: Secondary | ICD-10-CM | POA: Diagnosis not present

## 2021-01-17 DIAGNOSIS — C801 Malignant (primary) neoplasm, unspecified: Secondary | ICD-10-CM | POA: Diagnosis present

## 2021-01-17 DIAGNOSIS — E669 Obesity, unspecified: Secondary | ICD-10-CM | POA: Diagnosis present

## 2021-01-17 DIAGNOSIS — D6869 Other thrombophilia: Secondary | ICD-10-CM | POA: Diagnosis not present

## 2021-01-17 DIAGNOSIS — R188 Other ascites: Secondary | ICD-10-CM | POA: Diagnosis not present

## 2021-01-17 DIAGNOSIS — I63412 Cerebral infarction due to embolism of left middle cerebral artery: Secondary | ICD-10-CM | POA: Diagnosis not present

## 2021-01-17 DIAGNOSIS — B9620 Unspecified Escherichia coli [E. coli] as the cause of diseases classified elsewhere: Secondary | ICD-10-CM | POA: Diagnosis present

## 2021-01-17 DIAGNOSIS — I639 Cerebral infarction, unspecified: Secondary | ICD-10-CM | POA: Diagnosis present

## 2021-01-17 DIAGNOSIS — R4701 Aphasia: Secondary | ICD-10-CM | POA: Diagnosis present

## 2021-01-17 DIAGNOSIS — I10 Essential (primary) hypertension: Secondary | ICD-10-CM | POA: Diagnosis not present

## 2021-01-17 DIAGNOSIS — R1011 Right upper quadrant pain: Secondary | ICD-10-CM | POA: Diagnosis not present

## 2021-01-17 DIAGNOSIS — Z791 Long term (current) use of non-steroidal anti-inflammatories (NSAID): Secondary | ICD-10-CM

## 2021-01-17 DIAGNOSIS — Z87891 Personal history of nicotine dependence: Secondary | ICD-10-CM

## 2021-01-17 DIAGNOSIS — Z91048 Other nonmedicinal substance allergy status: Secondary | ICD-10-CM

## 2021-01-17 DIAGNOSIS — K567 Ileus, unspecified: Secondary | ICD-10-CM | POA: Diagnosis present

## 2021-01-17 DIAGNOSIS — Z96643 Presence of artificial hip joint, bilateral: Secondary | ICD-10-CM | POA: Diagnosis present

## 2021-01-17 DIAGNOSIS — K219 Gastro-esophageal reflux disease without esophagitis: Secondary | ICD-10-CM | POA: Diagnosis not present

## 2021-01-17 DIAGNOSIS — G893 Neoplasm related pain (acute) (chronic): Secondary | ICD-10-CM | POA: Diagnosis not present

## 2021-01-17 DIAGNOSIS — M799 Soft tissue disorder, unspecified: Secondary | ICD-10-CM | POA: Diagnosis not present

## 2021-01-17 DIAGNOSIS — R1010 Upper abdominal pain, unspecified: Secondary | ICD-10-CM | POA: Diagnosis not present

## 2021-01-17 DIAGNOSIS — G4733 Obstructive sleep apnea (adult) (pediatric): Secondary | ICD-10-CM | POA: Diagnosis present

## 2021-01-17 DIAGNOSIS — I6389 Other cerebral infarction: Secondary | ICD-10-CM | POA: Diagnosis not present

## 2021-01-17 DIAGNOSIS — R609 Edema, unspecified: Secondary | ICD-10-CM | POA: Diagnosis not present

## 2021-01-17 LAB — URINALYSIS, COMPLETE (UACMP) WITH MICROSCOPIC
Bilirubin Urine: NEGATIVE
Glucose, UA: NEGATIVE mg/dL
Ketones, ur: NEGATIVE mg/dL
Nitrite: POSITIVE — AB
Protein, ur: NEGATIVE mg/dL
Specific Gravity, Urine: 1.016 (ref 1.005–1.030)
WBC, UA: >50 WBC/hpf — ABNORMAL HIGH (ref 0–5)
pH: 5 (ref 5.0–8.0)

## 2021-01-17 LAB — COMPREHENSIVE METABOLIC PANEL
ALT: 10 U/L (ref 0–44)
AST: 21 U/L (ref 15–41)
Albumin: 2.6 g/dL — ABNORMAL LOW (ref 3.5–5.0)
Alkaline Phosphatase: 72 U/L (ref 38–126)
Anion gap: 10 (ref 5–15)
BUN: 49 mg/dL — ABNORMAL HIGH (ref 8–23)
CO2: 26 mmol/L (ref 22–32)
Calcium: 9.2 mg/dL (ref 8.9–10.3)
Chloride: 103 mmol/L (ref 98–111)
Creatinine, Ser: 1.68 mg/dL — ABNORMAL HIGH (ref 0.61–1.24)
GFR, Estimated: 44 mL/min — ABNORMAL LOW (ref >60)
Glucose, Bld: 105 mg/dL — ABNORMAL HIGH (ref 70–99)
Potassium: 3.8 mmol/L (ref 3.5–5.1)
Sodium: 139 mmol/L (ref 135–145)
Total Bilirubin: 0.7 mg/dL (ref 0.3–1.2)
Total Protein: 6.5 g/dL (ref 6.5–8.1)

## 2021-01-17 LAB — CBC WITH DIFFERENTIAL/PLATELET
Abs Immature Granulocytes: 0.02 10*3/uL (ref 0.00–0.07)
Basophils Absolute: 0 10*3/uL (ref 0.0–0.1)
Basophils Relative: 1 %
Eosinophils Absolute: 0.2 10*3/uL (ref 0.0–0.5)
Eosinophils Relative: 2 %
HCT: 31.8 % — ABNORMAL LOW (ref 39.0–52.0)
Hemoglobin: 10.4 g/dL — ABNORMAL LOW (ref 13.0–17.0)
Immature Granulocytes: 0 %
Lymphocytes Relative: 10 %
Lymphs Abs: 0.6 10*3/uL — ABNORMAL LOW (ref 0.7–4.0)
MCH: 28.8 pg (ref 26.0–34.0)
MCHC: 32.7 g/dL (ref 30.0–36.0)
MCV: 88.1 fL (ref 80.0–100.0)
Monocytes Absolute: 0.5 10*3/uL (ref 0.1–1.0)
Monocytes Relative: 8 %
Neutro Abs: 5.3 10*3/uL (ref 1.7–7.7)
Neutrophils Relative %: 79 %
Platelets: 117 10*3/uL — ABNORMAL LOW (ref 150–400)
RBC: 3.61 MIL/uL — ABNORMAL LOW (ref 4.22–5.81)
RDW: 14.6 % (ref 11.5–15.5)
WBC: 6.7 10*3/uL (ref 4.0–10.5)
nRBC: 0 % (ref 0.0–0.2)

## 2021-01-17 LAB — TSH: TSH: 1.067 u[IU]/mL (ref 0.320–4.118)

## 2021-01-17 LAB — AMMONIA: Ammonia: 24 umol/L (ref 9–35)

## 2021-01-17 LAB — TOTAL PROTEIN, URINE DIPSTICK: Protein, ur: 30 mg/dL — AB

## 2021-01-17 MED ORDER — ENOXAPARIN SODIUM 40 MG/0.4ML IJ SOSY
40.0000 mg | PREFILLED_SYRINGE | INTRAMUSCULAR | Status: DC
  Administered 2021-01-17 – 2021-01-19 (×3): 40 mg via SUBCUTANEOUS
  Filled 2021-01-17 (×3): qty 0.4

## 2021-01-17 MED ORDER — SODIUM CHLORIDE 0.9 % IV SOLN
Freq: Once | INTRAVENOUS | Status: AC
  Filled 2021-01-17: qty 250

## 2021-01-17 MED ORDER — SODIUM CHLORIDE 0.9 % IV SOLN: INTRAVENOUS | Status: AC

## 2021-01-17 MED ORDER — GADOBUTROL 1 MMOL/ML IV SOLN
10.0000 mL | Freq: Once | INTRAVENOUS | Status: AC | PRN
  Administered 2021-01-17: 10 mL via INTRAVENOUS

## 2021-01-17 MED ORDER — SODIUM CHLORIDE 0.9 % IV SOLN: 1.0000 g | Freq: Once | INTRAVENOUS | Status: DC

## 2021-01-17 MED ORDER — SODIUM CHLORIDE 0.9 % IV BOLUS
1000.0000 mL | Freq: Once | INTRAVENOUS | Status: AC
  Administered 2021-01-18: 1000 mL via INTRAVENOUS

## 2021-01-17 MED ORDER — SODIUM CHLORIDE 0.9 % IV SOLN
1.0000 g | INTRAVENOUS | Status: DC
  Administered 2021-01-17 – 2021-01-19 (×3): 1 g via INTRAVENOUS
  Filled 2021-01-17 (×2): qty 10
  Filled 2021-01-17 (×2): qty 1

## 2021-01-17 NOTE — ED Notes (Signed)
Pt returned from MRI °

## 2021-01-17 NOTE — H&P (Addendum)
History and Physical    Timothy Barton [id number] DOB: 07-Apr-1951 DOA: 01/17/2021  PCP: Medicine, [facility]  Patient coming from: Cancer center  I have personally briefly reviewed patient's old medical records in [place]  Chief Complaint: AMS  HPI: Timothy Barton is a 70 y.o. 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  Wife at bedside provides history as patient is alert and oriented only to self at this time.  She states that yesterday he was in his normal state of health. They went to cancer education yesterday and he even drove himself to get a haircut. However today he awoke and began to say things that did not make sense.  She then called EMS who evaluated him on the field and ruled out stroke.  However he was endorsing to her that he was seeing "split vision" whenever he tries to see with both eyes. He did not recognize who she was and could barely follow commands.  She then brought him over to the cancer center where he was evaluated with blood work obtained and IV fluids started. He was sent initially accepted as a direct admission but was brought to the ED instead due to lack of bed placement.  Wife says that he has his usual postprandial abdominal pain due to his cancer.  Has not really eaten anything since yesterday.  Denies any nausea, vomiting or diarrhea.  States he chronically has had a cough for the past month and a half.  He was a former smoker but quit 30 years ago.  Denies any fever.  He first presented to oncology on 01/10/21 with suspected [place] with metastasis.  He has planned PET scan tomorrow on 01/18/21 and a biopsy on 01/19/2021 planned for a soft tissue deposit in the left paraspinal muscle to confirm metastasis.  He follows with Dr. Burr Medico and has planned immunotherapy  treatment.  ED Course: He had temperature of 99.4F, normotensive on room air.  No leukocytosis.  Hemoglobin stable at baseline of 10.4.  Platelet of 117.  Creatinine elevated to 1.68 from prior of 1.15 with BUN of 49.  BG of 105.  LFTs are normal.  Ammonia level was normal.  However UA positive for moderate leukocyte, positive nitrite, many bacteria and greater than 50 WBC.  Chest x-ray also showing bilateral hazy opacity with bilateral small pleural effusion.  COVID PCR test pending at time of admission.  Patient was started on IV Rocephin for UTI and hospitalist was called for admission.  Review of Systems: Unable to obtain given patient's altered mental status  Past Medical History:  Diagnosis Date   A-fib [facility])    Arthritis    Hypertension    Osteoarthritis of right hip 07/05/2012   Sleep apnea     Past Surgical History:  Procedure Laterality Date   arthrosopy     BACK SURGERY  2006    ruptured disc,and repair of leak x3   CARPAL TUNNEL RELEASE  2002   right and left   KNEE ARTHROSCOPY  2006   right   TOTAL HIP ARTHROPLASTY  07/03/2012   Procedure: TOTAL HIP ARTHROPLASTY;  Surgeon: Yvette Rack., MD;  Location: [facility];  Service: Orthopedics;  Laterality: Right;  with autograft   TOTAL HIP ARTHROPLASTY Left 11/04/2014   Procedure: TOTAL HIP ARTHROPLASTY;  Surgeon: Earlie Server, MD;  Location:  [facility];  Service: Orthopedics;  Laterality: Left;     reports that he quit smoking about 22 years ago. He has a 31.50 pack-year smoking history. He quit smokeless tobacco use about 38 years ago.  His smokeless tobacco use included chew. He reports that he does not drink alcohol and does not use drugs. Social History  Allergies  Allergen Reactions   Tape     Use paper tape only    Family History  Problem Relation Age of Onset   Stroke Mother      Prior to Admission medications   Medication Sig Start Date End Date Taking? Authorizing Provider  aspirin EC 81 MG tablet Take 81  mg by mouth daily. Swallow whole.    [provider]  Glucosamine-Chondroitin 1500-1200 MG/30ML LIQD Take by mouth.    [provider]  lidocaine-prilocaine (EMLA) cream Apply 1 application topically as needed. 01/16/21   Truitt Merle, MD  losartan-hydrochlorothiazide (HYZAAR) 100-12.5 MG tablet Take 1 tablet by mouth daily.    [provider]  meloxicam (MOBIC) 15 MG tablet Take 15 mg by mouth daily. 01/09/21   [provider]  metoprolol succinate (TOPROL-XL) 50 MG 24 hr tablet Take 50 mg by mouth daily. 10/18/20   [provider]  Misc Natural Products (GLUCOS-CHONDROIT-MSM COMPLEX PO) Take 2 tablets by mouth daily.    [provider]  Omega-3 Fatty Acids (FISH OIL) 1360 MG CAPS Take 2 capsules by mouth daily.     [provider]  ondansetron (ZOFRAN) 8 MG tablet Take 1 tablet (8 mg total) by mouth every 8 (eight) hours as needed for nausea or vomiting. 01/16/21   Truitt Merle, MD  prochlorperazine (COMPAZINE) 10 MG tablet Take 1 tablet (10 mg total) by mouth every 6 (six) hours as needed for nausea or vomiting. 01/16/21   Truitt Merle, MD  traMADol (ULTRAM) 50 MG tablet Take 1 tablet (50 mg total) by mouth every 6 (six) hours as needed. 01/11/21   Truitt Merle, MD    Physical Exam: Vitals:   01/17/21 1758 01/17/21 1830 01/17/21 1845 01/17/21 1900  BP:  114/67 101/61   Pulse:  92 94   Resp:  '16 18 18  '$ Temp:      SpO2:  94% 94%   Weight: 124 kg     Height: '5\' 9"'$  (1.753 m)       Constitutional: NAD, calm, comfortable, elderly male laying flat in bed with only right eye closed at times Vitals:   01/17/21 1758 01/17/21 1830 01/17/21 1845 01/17/21 1900  BP:  114/67 101/61   Pulse:  92 94   Resp:  '16 18 18  '$ Temp:      SpO2:  94% 94%   Weight: 124 kg     Height: '5\' 9"'$  (1.753 m)      Eyes: PERRL, lids and conjunctivae normal ENMT: Mucous membranes are moist.  Neck: normal, supple Respiratory: clear to auscultation bilaterally, no  wheezing, no crackles. Normal respiratory effort. No accessory muscle use.  Cardiovascular: Regular rate and rhythm, no murmurs / rubs / gallops. No extremity edema.  Abdomen: no tenderness, no masses palpated. . Bowel sounds positive.  Musculoskeletal: no clubbing / cyanosis. No joint deformity upper and lower extremities. Good ROM, no contractures. Normal muscle tone.  Skin: no rashes, lesions, ulcers. No induration Neurologic: Alert and oriented only to self but twice answers questions inappropriately.  However still able to follow commands. Psychiatric: Unable to assess alert and oriented to self  only.    Labs on Admission: I have personally reviewed following labs and imaging studies  CBC: Recent Labs  Lab 01/17/21 1140  WBC 6.7  NEUTROABS 5.3  HGB 10.4*  HCT 31.8*  MCV 88.1  PLT 123XX123*   Basic Metabolic Panel: Recent Labs  Lab 01/17/21 1140  NA 139  K 3.8  CL 103  CO2 26  GLUCOSE 105*  BUN 49*  CREATININE 1.68*  CALCIUM 9.2   GFR: Estimated Creatinine Clearance: 54 mL/min (A) (by C-G formula based on SCr of 1.68 mg/dL (H)). Liver Function Tests: Recent Labs  Lab 01/17/21 1140  AST 21  ALT 10  ALKPHOS 72  BILITOT 0.7  PROT 6.5  ALBUMIN 2.6*   No results for input(s): LIPASE, AMYLASE in the last 168 hours. Recent Labs  Lab 01/17/21 1139  AMMONIA 24   Coagulation Profile: No results for input(s): INR, PROTIME in the last 168 hours. Cardiac Enzymes: No results for input(s): CKTOTAL, CKMB, CKMBINDEX, TROPONINI in the last 168 hours. BNP (last 3 results) No results for input(s): PROBNP in the last 8760 hours. HbA1C: No results for input(s): HGBA1C in the last 72 hours. CBG: No results for input(s): GLUCAP in the last 168 hours. Lipid Profile: No results for input(s): CHOL, HDL, LDLCALC, TRIG, CHOLHDL, LDLDIRECT in the last 72 hours. Thyroid Function Tests: Recent Labs    01/17/21 1139  TSH 1.067   Anemia Panel: No results for input(s):  VITAMINB12, FOLATE, FERRITIN, TIBC, IRON, RETICCTPCT in the last 72 hours. Urine analysis:    Component Value Date/Time   COLORURINE YELLOW 01/17/2021 1330   APPEARANCEUR HAZY (A) 01/17/2021 1330   LABSPEC 1.016 01/17/2021 1330   PHURINE 5.0 01/17/2021 1330   GLUCOSEU NEGATIVE 01/17/2021 1330   HGBUR SMALL (A) 01/17/2021 1330   BILIRUBINUR NEGATIVE 01/17/2021 1330   KETONESUR NEGATIVE 01/17/2021 1330   PROTEINUR 30 (A) 01/17/2021 1330   PROTEINUR NEGATIVE 01/17/2021 1330   UROBILINOGEN 1.0 10/26/2014 1446   NITRITE POSITIVE (A) 01/17/2021 1330   LEUKOCYTESUR MODERATE (A) 01/17/2021 1330    Radiological Exams on Admission: DG Chest 2 View  Result Date: 01/17/2021 CLINICAL DATA:  acute mental status change, cancer, r/o infection EXAM: CHEST - 2 VIEW COMPARISON:  12/13/2020 FINDINGS: Cardiomediastinal contours are within normal limits. Atherosclerotic calcification of the aortic knob. Small bilateral pleural effusions with associated hazy bibasilar opacities. No pneumothorax. IMPRESSION: Small bilateral pleural effusions with associated hazy bibasilar opacities which may represent atelectasis versus infection. Electronically Signed   By: Davina Poke D.O.   On: 01/17/2021 14:57      Assessment/Plan  New left cerebral hemisphere ischemic infarct -MRI with and without contrast was obtained and resulted with early acute to subacute ischemic infarcts  -case discussed with neurology Dr. Rory Percy neurology. Will transfer [facility]. Wife updated over phone. - obtain MRA head and carotid ultrasound - obtain echocardiogram  - Hold on aspirin for now per neurology due to liver malignancy  -Obtain A1c and lipids -PT/OT/SLT -Frequent neuro checks and keep on telemetry -Allow for permissive hypertension with blood pressure treatment as needed only if systolic goes above [id number]   Acute metabolic encephalopathy likely secondary to UTI and CVA -Continue IV Rocephin -stroke treatment as  above  AKI -Creatinine elevated to 1.68 prior 1.5 -Continuous IV fluids and monitor with repeat labs in the morning -Avoid nephrotoxic agent  Bilateral opacity -CXR with bilateral opacity-COVID PCR pending. No other symptoms endorsed by wife to suggest pneumonia -had CT chest without  contrast on 12/27/20 with small bilateral pleural effusion, ascending aortic aneurysm  Anemia -Hemoglobin stable  Thrombocytopenia - Platelet of 117.  This appears to be chronic and stable  Multifocal HCC with suspected metastatic  -follows with Dr. Burr Medico   DVT prophylaxis:.Lovenox Code Status: Full Family Communication: Plan discussed with wife at bedside  disposition Plan: Home with observation Consults called:  Admission status: Observation  Level of care: Med-Surg  Status is: Observation  The patient remains OBS appropriate and will d/c before 2 midnights.  Dispo: The patient is from: Home              Anticipated d/c is to: Home              Patient currently is not medically stable to d/c.   Difficult to place patient No         Orene Desanctis DO [facility]   If 7PM-7AM, please contact night-coverage [email or web address]   01/17/2021, 7:39 PM

## 2021-01-17 NOTE — Telephone Encounter (Signed)
This nurse spoke with patients wife who stated patient awoke early this morning and was very confused and disoriented.  She called ems and they did not take the patient due to vital signs being stable.  This nurse spoke with Dr. Burr Medico.  Who advised for patient to come to cancer center for labs, to rule out elevated Ammonia levels.  Spouse made aware.  No further questions.

## 2021-01-17 NOTE — Progress Notes (Signed)
The proposed treatment discussed in conference is for discussion purpose only and is not a binding recommendation.  The patients have not been physically examined, or presented with their treatment options.  Therefore, final treatment plans cannot be decided.  

## 2021-01-17 NOTE — ED Notes (Signed)
ED TO INPATIENT HANDOFF REPORT  ED Nurse Name and Phone #: Erick Colace, RN [phone]  S Name/Age/Gender Timothy Barton 70 y.o. male Room/Bed: WA23/WA23  Code Status   Code Status: Full Code  Home/SNF/Other Home Patient oriented to: self Is this baseline? No   Triage Complete: Triage complete  Chief Complaint AMS (altered mental status) [R41.82]  Triage Note From cancer center with complaints of AMS x24hrs.     Allergies Allergies  Allergen Reactions  . Tape     Use paper tape only    Level of Care/Admitting Diagnosis ED Disposition    ED Disposition  Admit   Condition  --   Comment  Hospital Area: [facility] [[id number]]  Level of Care: Med-Surg [16]  May place patient in observation at [facility] or [facility] if equivalent level of care is available:: No  Covid Evaluation: Asymptomatic Screening Protocol (No Symptoms)  Diagnosis: AMS (altered mental status) [id number]  Admitting Physician: Orene Desanctis [id number]  Attending Physician: Orene Desanctis [id number]         B Medical/Surgery History Past Medical History:  Diagnosis Date  . A-fib ([facility])   . Arthritis   . Hypertension   . Osteoarthritis of right hip 07/05/2012  . Sleep apnea    Past Surgical History:  Procedure Laterality Date  . arthrosopy    . BACK SURGERY  2006    ruptured disc,and repair of leak x3  . CARPAL TUNNEL RELEASE  2002   right and left  . KNEE ARTHROSCOPY  2006   right  . TOTAL HIP ARTHROPLASTY  07/03/2012   Procedure: TOTAL HIP ARTHROPLASTY;  Surgeon: Yvette Rack., MD;  Location: [facility];  Service: Orthopedics;  Laterality: Right;  with autograft  . TOTAL HIP ARTHROPLASTY Left 11/04/2014   Procedure: TOTAL HIP ARTHROPLASTY;  Surgeon: Earlie Server, MD;  Location: [facility];  Service: Orthopedics;  Laterality: Left;     A IV Location/Drains/Wounds Patient Lines/Drains/Airways Status    Active Line/Drains/Airways    Name Placement date Placement time Site  Days   Peripheral IV 01/17/21 22 G Anterior;Left;Proximal Forearm 01/17/21  1456  Forearm  less than 1   Incision 07/03/12 Hip Right 07/03/12  0824  -- 3120   Incision (Closed) 11/04/14 Hip Left 11/04/14  1022  -- 2266   Wound Blister (Serous filled) Buttocks Right Looks like blisters that had been scraped open. Most likely from tape. PA notified --  --  Buttocks  --          Intake/Output Last 24 hours No intake or output data in the 24 hours ending 01/17/21 2107  Labs/Imaging Results for orders placed or performed in visit on 01/17/21 (from the past 48 hour(s))  TSH     Status: None   Collection Time: 01/17/21 11:39 AM  Result Value Ref Range   TSH 1.067 0.320 - 4.118 uIU/mL    Comment: Performed at [facility], [address] [place], [place] [address]  Ammonia     Status: None   Collection Time: 01/17/21 11:39 AM  Result Value Ref Range   Ammonia 24 9 - 35 umol/L    Comment: Performed at [facility], [place] [address] [place],  [address]  Comprehensive metabolic panel     Status: Abnormal   Collection Time: 01/17/21 11:40 AM  Result Value Ref Range   Sodium 139 135 - 145 mmol/L   Potassium 3.8 3.5 - 5.1  mmol/L   Chloride 103 98 - 111 mmol/L   CO2 26 22 - 32 mmol/L   Glucose, Bld 105 (H) 70 - 99 mg/dL    Comment: Glucose reference range applies only to samples taken after fasting for at least 8 hours.   BUN 49 (H) 8 - 23 mg/dL   Creatinine, Ser 1.68 (H) 0.61 - 1.24 mg/dL   Calcium 9.2 8.9 - 10.3 mg/dL   Total Protein 6.5 6.5 - 8.1 g/dL   Albumin 2.6 (L) 3.5 - 5.0 g/dL   AST 21 15 - 41 U/L   ALT 10 0 - 44 U/L   Alkaline Phosphatase 72 38 - 126 U/L   Total Bilirubin 0.7 0.3 - 1.2 mg/dL   GFR, Estimated 44 (L) >60 mL/min    Comment: (NOTE) Calculated using the CKD-EPI Creatinine Equation (2021)    Anion gap 10 5 - 15    Comment: Performed at [facility], [place] [address], [place], [place]  [address]  CBC with Differential/Platelet     Status: Abnormal   Collection Time: 01/17/21 11:40 AM  Result Value Ref Range   WBC 6.7 4.0 - 10.5 K/uL   RBC 3.61 (L) 4.22 - 5.81 MIL/uL   Hemoglobin 10.4 (L) 13.0 - 17.0 g/dL   HCT 31.8 (L) 39.0 - 52.0 %   MCV 88.1 80.0 - 100.0 fL   MCH 28.8 26.0 - 34.0 pg   MCHC 32.7 30.0 - 36.0 g/dL   RDW 14.6 11.5 - 15.5 %   Platelets 117 (L) 150 - 400 K/uL   nRBC 0.0 0.0 - 0.2 %   Neutrophils Relative % 79 %   Neutro Abs 5.3 1.7 - 7.7 K/uL   Lymphocytes Relative 10 %   Lymphs Abs 0.6 (L) 0.7 - 4.0 K/uL   Monocytes Relative 8 %   Monocytes Absolute 0.5 0.1 - 1.0 K/uL   Eosinophils Relative 2 %   Eosinophils Absolute 0.2 0.0 - 0.5 K/uL   Basophils Relative 1 %   Basophils Absolute 0.0 0.0 - 0.1 K/uL   Immature Granulocytes 0 %   Abs Immature Granulocytes 0.02 0.00 - 0.07 K/uL    Comment: Performed at [facility], [place] [address], [place], [place] [address]  Total Protein, Urine dipstick     Status: Abnormal   Collection Time: 01/17/21  1:30 PM  Result Value Ref Range   Protein, ur 30 (A) NEGATIVE mg/dL    Comment: Performed at [facility], [place] [address] [place], [place] [address]  Urinalysis, Complete w Microscopic     Status: Abnormal   Collection Time: 01/17/21  1:30 PM  Result Value Ref Range   Color, Urine YELLOW YELLOW   APPearance HAZY (A) CLEAR   Specific Gravity, Urine 1.016 1.005 - 1.030   pH 5.0 5.0 - 8.0   Glucose, UA NEGATIVE NEGATIVE mg/dL   Hgb urine dipstick SMALL (A) NEGATIVE   Bilirubin Urine NEGATIVE NEGATIVE   Ketones, ur NEGATIVE NEGATIVE mg/dL   Protein, ur NEGATIVE NEGATIVE mg/dL   Nitrite POSITIVE (A) NEGATIVE   Leukocytes,Ua MODERATE (A) NEGATIVE   RBC / HPF 0-5 0 - 5 RBC/hpf   WBC, UA >50 (H) 0 - 5 WBC/hpf   Bacteria, UA MANY (A) NONE SEEN   Squamous Epithelial / LPF 0-5 0 - 5   WBC Clumps PRESENT    Mucus PRESENT    Hyaline Casts, UA PRESENT     Comment:  Performed at [facility], [place] [address], [place], [place] [address]   DG Chest 2 View  Result Date: 01/17/2021 CLINICAL DATA:  acute mental status change, cancer, r/o infection EXAM: CHEST - 2 VIEW COMPARISON:  12/13/2020 FINDINGS: Cardiomediastinal contours are within normal limits. Atherosclerotic calcification of the aortic knob. Small bilateral pleural effusions with associated hazy bibasilar opacities. No pneumothorax. IMPRESSION: Small bilateral pleural effusions with associated hazy bibasilar opacities which may represent atelectasis versus infection. Electronically Signed   By: Davina Poke D.O.   On: 01/17/2021 14:57    Pending Labs Unresulted Labs (From admission, onward)    Start     Ordered   01/18/21 [id number]  Basic metabolic panel  Tomorrow morning,   R        01/17/21 1909   01/17/21 1924  Urine Culture  Once,   STAT       Question:  Indication  Answer:  Dysuria   01/17/21 1924   01/17/21 1912  SARS CORONAVIRUS 2 (TAT 6-24 HRS) Nasopharyngeal Nasopharyngeal Swab  (Tier 3 - Symptomatic/asymptomatic)  Once,   STAT       Question Answer Comment  Is this test for diagnosis or screening Screening   Symptomatic for COVID-19 as defined by CDC No   Hospitalized for COVID-19 No   Admitted to ICU for COVID-19 No   Previously tested for COVID-19 Yes   Resident in a congregate (group) care setting Unknown   Employed in healthcare setting Unknown   Has patient completed COVID vaccination(s) (2 doses of Pfizer/Moderna 1 dose of [employer]) Unknown      01/17/21 1911          Vitals/Pain Today's Vitals   01/17/21 1830 01/17/21 1845 01/17/21 1900 01/17/21 2000  BP: 114/67 101/61  (!) 103/59  Pulse: 92 94  93  Resp: '16 18 18 20  '$ Temp:    99.1 F (37.3 C)  TempSrc:    Oral  SpO2: 94% 94%  94%  Weight:      Height:      PainSc:        Isolation Precautions No active isolations  Medications Medications  enoxaparin (LOVENOX) injection 40 mg  (has no administration in time range)  sodium chloride 0.9 % bolus 1,000 mL (has no administration in time range)  cefTRIAXone (ROCEPHIN) 1 g in sodium chloride 0.9 % 100 mL IVPB (has no administration in time range)  0.9 %  sodium chloride infusion (has no administration in time range)  gadobutrol (GADAVIST) 1 MMOL/ML injection 10 mL (10 mLs Intravenous Contrast Given 01/17/21 2041)    Mobility walks with person assist High fall risk   Focused Assessments    R Recommendations: See Admitting Provider Note  Report given to:   Additional Notes:

## 2021-01-17 NOTE — ED Provider Notes (Signed)
[facility] DEPT Provider Note   CSN: [id number] Arrival date & time: 01/17/21  1753     History Chief Complaint  Patient presents with   Altered Mental Status    Timothy Barton is a 70 y.o. male.  HPI  70 year old male with past medical history of atrial fibrillation, HTN, hepatocellular carcinoma, chronic weakness presents the emergency department with concern for altered mental status.  Patient was reportedly evaluated at the cancer center, blood work showed worsening kidney dysfunction and a UTI and he was referred here for admission.  Patient states that he feels weak but has no specific complaints.  Appears confused but is oriented to self.  Spouse is at bedside and the main history giver.  No fever, cough, diarrhea.  Past Medical History:  Diagnosis Date   A-fib [facility])    Arthritis    Hypertension    Osteoarthritis of right hip 07/05/2012   Sleep apnea     Patient Active Problem List   Diagnosis Date Noted   AMS (altered mental status) 01/17/2021   Hepatocellular carcinoma ([place]) 01/11/2021   Primary localized osteoarthritis of left hip 11/04/2014   Abnormality of gait 07/27/2012   Hip weakness 07/27/2012   Contact dermatitis 07/07/2012   Osteoarthritis of right hip 07/05/2012    Past Surgical History:  Procedure Laterality Date   arthrosopy     BACK SURGERY  2006    ruptured disc,and repair of leak x3   CARPAL TUNNEL RELEASE  2002   right and left   KNEE ARTHROSCOPY  2006   right   TOTAL HIP ARTHROPLASTY  07/03/2012   Procedure: TOTAL HIP ARTHROPLASTY;  Surgeon: Yvette Rack., MD;  Location: [place];  Service: Orthopedics;  Laterality: Right;  with autograft   TOTAL HIP ARTHROPLASTY Left 11/04/2014   Procedure: TOTAL HIP ARTHROPLASTY;  Surgeon: Earlie Server, MD;  Location: [place];  Service: Orthopedics;  Laterality: Left;       Family History  Problem Relation Age of Onset   Stroke Mother     Social History   Tobacco  Use   Smoking status: Former    Packs/day: 1.50    Years: 21.00    Pack years: 31.50    Types: Cigarettes    Quit date: 07/01/1998    Years since quitting: 22.5   Smokeless tobacco: Former    Types: Chew    Quit date: 07/01/1982  Substance Use Topics   Alcohol use: No    Alcohol/week: 0.0 standard drinks   Drug use: No    Home Medications Prior to Admission medications   Medication Sig Start Date End Date Taking? Authorizing Provider  aspirin EC 81 MG tablet Take 81 mg by mouth daily. Swallow whole.    [provider]  Glucosamine-Chondroitin 1500-1200 MG/30ML LIQD Take by mouth.    [provider]  lidocaine-prilocaine (EMLA) cream Apply 1 application topically as needed. 01/16/21   Truitt Merle, MD  losartan-hydrochlorothiazide (HYZAAR) 100-12.5 MG tablet Take 1 tablet by mouth daily.    [provider]  meloxicam (MOBIC) 15 MG tablet Take 15 mg by mouth daily. 01/09/21   [provider]  metoprolol succinate (TOPROL-XL) 50 MG 24 hr tablet Take 50 mg by mouth daily. 10/18/20   [provider]  Misc Natural Products (GLUCOS-CHONDROIT-MSM COMPLEX PO) Take 2 tablets by mouth daily.    [provider]  Omega-3 Fatty Acids (FISH OIL) 1360 MG CAPS Take 2 capsules by mouth daily.  [provider]  ondansetron (ZOFRAN) 8 MG tablet Take 1 tablet (8 mg total) by mouth every 8 (eight) hours as needed for nausea or vomiting. 01/16/21   Truitt Merle, MD  prochlorperazine (COMPAZINE) 10 MG tablet Take 1 tablet (10 mg total) by mouth every 6 (six) hours as needed for nausea or vomiting. 01/16/21   Truitt Merle, MD  traMADol (ULTRAM) 50 MG tablet Take 1 tablet (50 mg total) by mouth every 6 (six) hours as needed. 01/11/21   Truitt Merle, MD    Allergies    Tape  Review of Systems   Review of Systems  Constitutional:  Positive for appetite change and fatigue. Negative for chills and fever.  HENT:  Negative for congestion.   Respiratory:  Negative  for shortness of breath.   Cardiovascular:  Negative for chest pain.  Gastrointestinal:  Negative for abdominal pain, diarrhea and vomiting.  Genitourinary:  Negative for dysuria and flank pain.  Skin:  Negative for rash.  Neurological:  Negative for facial asymmetry, speech difficulty, numbness and headaches.  Psychiatric/Behavioral:  Positive for confusion.    Physical Exam Updated Vital Signs BP 101/61   Pulse 94   Temp 99.2 F (37.3 C)   Resp 18   Ht '5\' 9"'$  (1.753 m)   Wt 124 kg   SpO2 94%   BMI 40.37 kg/m   Physical Exam Vitals and nursing note reviewed.  Constitutional:      Appearance: Normal appearance. He is not toxic-appearing or diaphoretic.  HENT:     Head: Normocephalic.     Mouth/Throat:     Mouth: Mucous membranes are moist.  Eyes:     Pupils: Pupils are equal, round, and reactive to light.  Cardiovascular:     Rate and Rhythm: Normal rate.  Pulmonary:     Effort: Pulmonary effort is normal. No respiratory distress.  Abdominal:     Palpations: Abdomen is soft.     Tenderness: There is no abdominal tenderness.  Skin:    General: Skin is warm.  Neurological:     Mental Status: He is alert. Mental status is at baseline.     Comments: Oriented to self, spouse and place, otherwise a poor historian but not acutely altered  Psychiatric:        Mood and Affect: Mood normal.    ED Results / Procedures / Treatments   Labs (all labs ordered are listed, but only abnormal results are displayed) Labs Reviewed  SARS CORONAVIRUS 2 (TAT 6-24 HRS)  URINE CULTURE  BASIC METABOLIC PANEL    EKG None  Radiology DG Chest 2 View  Result Date: 01/17/2021 CLINICAL DATA:  acute mental status change, cancer, r/o infection EXAM: CHEST - 2 VIEW COMPARISON:  12/13/2020 FINDINGS: Cardiomediastinal contours are within normal limits. Atherosclerotic calcification of the aortic knob. Small bilateral pleural effusions with associated hazy bibasilar opacities. No pneumothorax.  IMPRESSION: Small bilateral pleural effusions with associated hazy bibasilar opacities which may represent atelectasis versus infection. Electronically Signed   By: Davina Poke D.O.   On: 01/17/2021 14:57    Procedures Procedures   Medications Ordered in ED Medications  enoxaparin (LOVENOX) injection 40 mg (has no administration in time range)  cefTRIAXone (ROCEPHIN) 1 g in sodium chloride 0.9 % 100 mL IVPB (has no administration in time range)  sodium chloride 0.9 % bolus 1,000 mL (has no administration in time range)    ED Course  I have reviewed the triage vital signs and the nursing notes.  Pertinent labs & imaging results that were available during my care of the patient were reviewed by me and considered in my medical decision making (see chart for details).    MDM Rules/Calculators/A&P                           70 year old male presents the emergency department for admission due to altered mental status.  Outpatient blood work shows AKI with urinary tract infection.  Chest x-ray also had some hazy opacities.  Will hydrate and treat for urinary tract infection, will swab for COVID/flu and plan for admission.  Dr. Flossie Buffy is admitting.  Final Clinical Impression(s) / ED Diagnoses Final diagnoses:  AKI (acute kidney injury) ([place])  Urinary tract infection without hematuria, site unspecified    Rx / DC Orders ED Discharge Orders     None        Lorelle Gibbs, DO 01/17/21 [id number]

## 2021-01-17 NOTE — ED Triage Notes (Signed)
From cancer center with complaints of AMS x24hrs.

## 2021-01-17 NOTE — Progress Notes (Addendum)
[facility]   [phone] [phone]   Clinic Follow up Note   Patient Care Team: Medicine, [facility] Internal as PCP - General (Internal Medicine) Truitt Merle, MD as Consulting Physician (Oncology) Royston Bake, RN as Oncology Nurse Navigator (Oncology) Date of Service: 01/17/21  CHIEF COMPLAINT: Acute mental status change  INTERVAL HISTORY: Mr. Plotts presents with his wife Lujean Rave for symptom management visit.  Initially seen in new patient consult on 01/10/2021.  He came in yesterday 01/16/21 for chemo education, was in his normal state when he went to bed that night.  Woke up early this morning 01/17/21 confused and disoriented, speaking clearly but not making sense with his wife.  EMS was called, vitals were stable, BG 144, stroke assessment was normal in the field, they were instructed to call oncology.  Presents in a wheelchair, awake but drowsy, looking at me with right eye closed.  States when he opens both eyes vision is not right.  He denies pain but feels hungry.  Per Lujean Rave, he has not had much to eat or drink recently.  Has been taking tramadol once per day.  She believes he is urinating and using the bathroom normally.  Denies known bleeding or fever.  He seems weaker, not out of bed much at home.   MEDICAL HISTORY:  Past Medical History:  Diagnosis Date   A-fib [facility])    Arthritis    Hypertension    Osteoarthritis of right hip 07/05/2012   Sleep apnea     SURGICAL HISTORY: Past Surgical History:  Procedure Laterality Date   arthrosopy     BACK SURGERY  2006    ruptured disc,and repair of leak x3   CARPAL TUNNEL RELEASE  2002   right and left   KNEE ARTHROSCOPY  2006   right   TOTAL HIP ARTHROPLASTY  07/03/2012   Procedure: TOTAL HIP ARTHROPLASTY;  Surgeon: Yvette Rack., MD;  Location: [place];  Service: Orthopedics;  Laterality: Right;  with autograft   TOTAL HIP ARTHROPLASTY Left 11/04/2014   Procedure: TOTAL HIP ARTHROPLASTY;   Surgeon: Earlie Server, MD;  Location: [place];  Service: Orthopedics;  Laterality: Left;    I have reviewed the social history and family history with the patient and they are unchanged from previous note.  ALLERGIES:  is allergic to tape.  MEDICATIONS:  No current facility-administered medications for this visit.   No current outpatient medications on file.   Facility-Administered Medications Ordered in Other Visits  Medication Dose Route Frequency Provider Last Rate Last Admin    stroke: mapping our early stages of recovery book   Does not apply Once Tu, Ching T, DO       aspirin tablet 325 mg  325 mg Oral Daily Rai, Ripudeep K, MD       Or   aspirin suppository 300 mg  300 mg Rectal Daily Rai, Ripudeep K, MD       cefTRIAXone (ROCEPHIN) 1 g in sodium chloride 0.9 % 100 mL IVPB  1 g Intravenous Q24H Tu, Ching T, DO 200 mL/hr at 01/17/21 2133 1 g at 01/17/21 2133   enoxaparin (LOVENOX) injection 40 mg  40 mg Subcutaneous Q24H Tu, Ching T, DO   40 mg at 01/17/21 2128    PHYSICAL EXAMINATION: ECOG PERFORMANCE STATUS: 3 - Symptomatic, >50% confined to bed  Vitals:   01/17/21 1308  BP: (!) 107/59  Pulse: 89  Resp: 19  Temp: 98.8 F (37.1 C)  SpO2: 97%   Filed Weights   01/17/21 1308  Weight: 274 lb 9.6 oz (124.6 kg)    GENERAL:alert, no distress and comfortable SKIN: No rash or jaundice EYES: sclera cleam. EOMs intact  OROPHARYNX: no thrush or ulcers. Tongue is midline HEENT: no asymmetry or facial droop LUNGS: clear with normal breathing effort HEART: regular rate & rhythm, moderate bilateral lower extremity edema ABDOMEN:abdomen firm and distended, positive bowel sounds Musculoskeletal: normal upper extremity strength bilaterally  NEURO: awake but drowsy, oriented to person. Expressive aphasia but words are clear. Does not answer questions or follow commands appropriately. Generalized weakness   LABORATORY DATA:  I have reviewed the data as listed CBC Latest Ref Rng  & Units 01/17/2021 01/10/2021 11/06/2014  WBC 4.0 - 10.5 K/uL 6.7 6.8 9.2  Hemoglobin 13.0 - 17.0 g/dL 10.4(L) 10.8(L) 11.1(L)  Hematocrit 39.0 - 52.0 % 31.8(L) 32.9(L) 33.5(L)  Platelets 150 - 400 K/uL 117(L) 113(L) 89(L)     CMP Latest Ref Rng & Units 01/18/2021 01/17/2021 01/10/2021  Glucose 70 - 99 mg/dL 113(H) 105(H) 108(H)  BUN 8 - 23 mg/dL 48(H) 49(H) 30(H)  Creatinine 0.61 - 1.24 mg/dL 1.60(H) 1.68(H) 1.15  Sodium 135 - 145 mmol/L 139 139 142  Potassium 3.5 - 5.1 mmol/L 3.6 3.8 3.9  Chloride 98 - 111 mmol/L 105 103 103  CO2 22 - 32 mmol/L '26 26 29  '$ Calcium 8.9 - 10.3 mg/dL 8.6(L) 9.2 9.4  Total Protein 6.5 - 8.1 g/dL - 6.5 6.4(L)  Total Bilirubin 0.3 - 1.2 mg/dL - 0.7 0.8  Alkaline Phos 38 - 126 U/L - 72 63  AST 15 - 41 U/L - 21 22  ALT 0 - 44 U/L - 10 15      RADIOGRAPHIC STUDIES: I have personally reviewed the radiological images as listed and agreed with the findings in the report. DG Chest 2 View  Result Date: 01/17/2021 CLINICAL DATA:  acute mental status change, cancer, r/o infection EXAM: CHEST - 2 VIEW COMPARISON:  12/13/2020 FINDINGS: Cardiomediastinal contours are within normal limits. Atherosclerotic calcification of the aortic knob. Small bilateral pleural effusions with associated hazy bibasilar opacities. No pneumothorax. IMPRESSION: Small bilateral pleural effusions with associated hazy bibasilar opacities which may represent atelectasis versus infection. Electronically Signed   By: Davina Poke D.O.   On: 01/17/2021 14:57   MR BRAIN W WO CONTRAST  Result Date: 01/17/2021 CLINICAL DATA:  Initial evaluation for altered mental status, unknown cause. EXAM: MRI HEAD WITHOUT AND WITH CONTRAST TECHNIQUE: Multiplanar, multiecho pulse sequences of the brain and surrounding structures were obtained without and with intravenous contrast. CONTRAST:  37m GADAVIST GADOBUTROL 1 MMOL/ML IV SOLN COMPARISON:  CT from 11/25/2020. FINDINGS: Brain: Generalized age-related cerebral  atrophy. Scattered patchy T2/FLAIR hyperintensity within the periventricular deep white matter both cerebral hemispheres most consistent with chronic small vessel ischemic disease, mild in nature. Small remote right cerebellar infarct noted. Patchy and confluent restricted diffusion involving the cortical and subcortical aspects of the left frontal, parietal, occipital, and temporal lobes, consistent with acute to early subacute ischemic infarcts. Largest area of involvement present at the left temporal lobe and measures up to 6 cm in diameter. Associated petechial hemorrhage without frank intraparenchymal hematoma or significant mass effect. Few additional subcentimeter acute to early subacute ischemic infarcts noted at the subcortical right frontal lobe (series 5, image 85) and superior left cerebellum (series 5, image 62). Gray-white matter differentiation otherwise maintained. No other evidence for acute or chronic intracranial hemorrhage. No mass lesion, midline  shift, or significant mass effect. No hydrocephalus or extra-axial fluid collection. Pituitary gland suprasellar region within normal limits. No abnormal enhancement or evidence for intracranial metastatic disease. Vascular: Major intracranial vascular flow voids are maintained. Skull and upper cervical spine: Craniocervical junction within normal limits. Multilevel degenerative spondylosis noted within the upper cervical spine with no more than mild spinal stenosis at C3-4. Bone marrow signal intensity heterogeneous without focal marrow replacing lesion. No scalp soft tissue abnormality. Sinuses/Orbits: Globes and orbital soft tissues within normal limits. Paranasal sinuses are largely clear. No significant mastoid effusion. Inner ear structures grossly normal. Other: None. IMPRESSION: 1. Patchy and confluent acute to early subacute ischemic infarcts involving the left cerebral hemisphere as above. Associated petechial hemorrhage without frank  intraparenchymal hematoma or significant mass effect. 2. Few additional subcentimeter acute to early subacute ischemic infarcts involving the subcortical right frontal lobe and superior left cerebellum. 3. No other acute intracranial abnormality. No evidence for intracranial metastatic disease. 4. Underlying age-related cerebral atrophy with mild chronic small vessel ischemic disease. Electronically Signed   By: Jeannine Boga M.D.   On: 01/17/2021 21:18     ASSESSMENT & PLAN: 70 yo male   1. AMS - confusion, disorientation, expressive aphasia, vision change, weakness -acute onset 01/17/21 -poor po at home, weak,  -VSS, afebrile -CBC stable, WBC normal; CMP shows normal LFTs, new AKI -ammonia and Tbili are normal -concerning for dehydration, infection, brain metastasis, or other  -work up in clinic and admit  -recommend stat brain MRI   2. 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 Unfortunately, work up also showed extensive bulky abdominal and retroperitoneal metastasis. A soft tissue deposit in the left paraspinal muscle is palpable on exam -AFP is normal 1.1, the diagnosis is HCC is suspected but not certain -Pending PET scan 7/28 and left paraspinal soft tissue biopsy 01/19/21 for confirmation -symptomatic, given tramadol 01/10/21, taking 1 daily PRN   Disposition: Mr. Stetter presents with acute mental status change in the setting of newly diagnosed cancer, suspect metastatic [place]. PET scan and extrahepatic biopsy are pending. He is hemodynamically stable. Labs show stable mild anemia and thrombocytopenia, normal electrolytes, LFTs, and ammonia. BG 105. He does have AKI, Scr up to 1.68 from normal, in the setting of poor p.o. at home.  The differential includes dehydration, infection, brain  metastasis, or other. Stroke assessment is negative, holding aspirin for port placement. He has been taking tramadol once daily, but in the setting of normal liver function I doubt opioid toxicity.   We are obtaining UA/culture and stat chest x-ray in clinic to rule out infection.  We are starting IV fluids.  I recommend stat brain MRI to rule out metastasis.    The patient was discussed with Dr. Burr Medico. His case has been accepted for direct admission to med/surg by hospitalist Dr. Wyline Copas. Patient and wife are aware.   Orders Placed This Encounter  Procedures   Urine Culture    Standing Status:   Future    Number of Occurrences:   1    Standing Expiration Date:   01/17/2022   DG Chest 2 View    Standing Status:   Future    Number of Occurrences:   1    Standing Expiration Date:   01/17/2022    Order Specific Question:  Reason for Exam (SYMPTOM  OR DIAGNOSIS REQUIRED)    Answer:   acute mental status change, cancer, r/o infection    Order Specific Question:   Preferred imaging location?    Answer:   [facility]   Urinalysis, Complete w Microscopic    Standing Status:   Future    Number of Occurrences:   1    Standing Expiration Date:   01/17/2022   All questions were answered. The patient knows to call the clinic with any problems, questions or concerns. No barriers to learning were detected. His spouse Lujean Rave is present.     Alla Feeling, NP 01/18/21

## 2021-01-17 NOTE — ED Notes (Addendum)
Patient transported to MRI 

## 2021-01-18 ENCOUNTER — Observation Stay (HOSPITAL_COMMUNITY): Payer: Medicare Other

## 2021-01-18 ENCOUNTER — Other Ambulatory Visit (HOSPITAL_COMMUNITY): Payer: Medicare Other

## 2021-01-18 ENCOUNTER — Ambulatory Visit (HOSPITAL_COMMUNITY): Payer: Medicare Other

## 2021-01-18 DIAGNOSIS — N390 Urinary tract infection, site not specified: Secondary | ICD-10-CM

## 2021-01-18 DIAGNOSIS — I63412 Cerebral infarction due to embolism of left middle cerebral artery: Secondary | ICD-10-CM | POA: Diagnosis present

## 2021-01-18 DIAGNOSIS — I6389 Other cerebral infarction: Secondary | ICD-10-CM | POA: Diagnosis not present

## 2021-01-18 DIAGNOSIS — C220 Liver cell carcinoma: Secondary | ICD-10-CM

## 2021-01-18 DIAGNOSIS — E669 Obesity, unspecified: Secondary | ICD-10-CM | POA: Diagnosis present

## 2021-01-18 DIAGNOSIS — R1011 Right upper quadrant pain: Secondary | ICD-10-CM | POA: Diagnosis not present

## 2021-01-18 DIAGNOSIS — M799 Soft tissue disorder, unspecified: Secondary | ICD-10-CM | POA: Diagnosis not present

## 2021-01-18 DIAGNOSIS — Z823 Family history of stroke: Secondary | ICD-10-CM | POA: Diagnosis not present

## 2021-01-18 DIAGNOSIS — D649 Anemia, unspecified: Secondary | ICD-10-CM | POA: Diagnosis present

## 2021-01-18 DIAGNOSIS — Z20822 Contact with and (suspected) exposure to covid-19: Secondary | ICD-10-CM | POA: Diagnosis present

## 2021-01-18 DIAGNOSIS — N179 Acute kidney failure, unspecified: Secondary | ICD-10-CM

## 2021-01-18 DIAGNOSIS — R188 Other ascites: Secondary | ICD-10-CM | POA: Diagnosis present

## 2021-01-18 DIAGNOSIS — R400 Somnolence: Secondary | ICD-10-CM | POA: Diagnosis not present

## 2021-01-18 DIAGNOSIS — R4701 Aphasia: Secondary | ICD-10-CM | POA: Diagnosis present

## 2021-01-18 DIAGNOSIS — Z6841 Body Mass Index (BMI) 40.0 and over, adult: Secondary | ICD-10-CM | POA: Diagnosis not present

## 2021-01-18 DIAGNOSIS — I6523 Occlusion and stenosis of bilateral carotid arteries: Secondary | ICD-10-CM | POA: Diagnosis present

## 2021-01-18 DIAGNOSIS — D696 Thrombocytopenia, unspecified: Secondary | ICD-10-CM | POA: Diagnosis present

## 2021-01-18 DIAGNOSIS — K567 Ileus, unspecified: Secondary | ICD-10-CM | POA: Diagnosis present

## 2021-01-18 DIAGNOSIS — K219 Gastro-esophageal reflux disease without esophagitis: Secondary | ICD-10-CM | POA: Diagnosis present

## 2021-01-18 DIAGNOSIS — Z8673 Personal history of transient ischemic attack (TIA), and cerebral infarction without residual deficits: Secondary | ICD-10-CM | POA: Diagnosis not present

## 2021-01-18 DIAGNOSIS — D6869 Other thrombophilia: Secondary | ICD-10-CM | POA: Diagnosis present

## 2021-01-18 DIAGNOSIS — G893 Neoplasm related pain (acute) (chronic): Secondary | ICD-10-CM | POA: Diagnosis present

## 2021-01-18 DIAGNOSIS — I10 Essential (primary) hypertension: Secondary | ICD-10-CM | POA: Diagnosis present

## 2021-01-18 DIAGNOSIS — I639 Cerebral infarction, unspecified: Secondary | ICD-10-CM

## 2021-01-18 DIAGNOSIS — G9341 Metabolic encephalopathy: Secondary | ICD-10-CM | POA: Diagnosis present

## 2021-01-18 DIAGNOSIS — I482 Chronic atrial fibrillation, unspecified: Secondary | ICD-10-CM | POA: Diagnosis present

## 2021-01-18 DIAGNOSIS — R1010 Upper abdominal pain, unspecified: Secondary | ICD-10-CM | POA: Diagnosis not present

## 2021-01-18 DIAGNOSIS — K7460 Unspecified cirrhosis of liver: Secondary | ICD-10-CM | POA: Diagnosis present

## 2021-01-18 DIAGNOSIS — R609 Edema, unspecified: Secondary | ICD-10-CM | POA: Diagnosis not present

## 2021-01-18 DIAGNOSIS — R109 Unspecified abdominal pain: Secondary | ICD-10-CM | POA: Diagnosis not present

## 2021-01-18 DIAGNOSIS — C799 Secondary malignant neoplasm of unspecified site: Secondary | ICD-10-CM | POA: Diagnosis present

## 2021-01-18 DIAGNOSIS — M1990 Unspecified osteoarthritis, unspecified site: Secondary | ICD-10-CM | POA: Diagnosis present

## 2021-01-18 LAB — SARS CORONAVIRUS 2 (TAT 6-24 HRS): SARS Coronavirus 2: NEGATIVE

## 2021-01-18 LAB — BASIC METABOLIC PANEL
Anion gap: 8 (ref 5–15)
BUN: 48 mg/dL — ABNORMAL HIGH (ref 8–23)
CO2: 26 mmol/L (ref 22–32)
Calcium: 8.6 mg/dL — ABNORMAL LOW (ref 8.9–10.3)
Chloride: 105 mmol/L (ref 98–111)
Creatinine, Ser: 1.6 mg/dL — ABNORMAL HIGH (ref 0.61–1.24)
GFR, Estimated: 46 mL/min — ABNORMAL LOW (ref >60)
Glucose, Bld: 113 mg/dL — ABNORMAL HIGH (ref 70–99)
Potassium: 3.6 mmol/L (ref 3.5–5.1)
Sodium: 139 mmol/L (ref 135–145)

## 2021-01-18 LAB — LIPID PANEL
Cholesterol: 115 mg/dL (ref 0–200)
HDL: 25 mg/dL — ABNORMAL LOW (ref >40)
LDL Cholesterol: 73 mg/dL (ref 0–99)
Total CHOL/HDL Ratio: 4.6 RATIO
Triglycerides: 84 mg/dL (ref <150)
VLDL: 17 mg/dL (ref 0–40)

## 2021-01-18 MED ORDER — LACTATED RINGERS IV SOLN: INTRAVENOUS | Status: DC

## 2021-01-18 MED ORDER — ASPIRIN EC 81 MG PO TBEC: 81.0000 mg | DELAYED_RELEASE_TABLET | Freq: Every day | ORAL | Status: DC

## 2021-01-18 MED ORDER — ASPIRIN 300 MG RE SUPP
300.0000 mg | Freq: Every day | RECTAL | Status: DC
  Filled 2021-01-18: qty 1

## 2021-01-18 MED ORDER — STROKE: EARLY STAGES OF RECOVERY BOOK
Freq: Once | Status: DC
  Filled 2021-01-18: qty 1

## 2021-01-18 MED ORDER — ASPIRIN 325 MG PO TABS: 325.0000 mg | ORAL_TABLET | Freq: Every day | ORAL | Status: DC

## 2021-01-18 NOTE — Care Management Obs Status (Signed)
[place] NOTIFICATION   Patient Details  Name: Timothy Barton MRN: [id number] Date of Birth: 1951-01-13   Medicare Observation Status Notification Given:  Yes    Purcell Mouton, RN 01/18/2021, 2:04 PM

## 2021-01-18 NOTE — TOC Progression Note (Signed)
Transition of Care [facility]) - Progression Note    Patient Details  Name: Timothy Barton MRN: [id number] Date of Birth: [birth year]  Transition of Care [facility]) CM/SW Contact  Purcell Mouton, RN Phone Number: 01/18/2021, 1:49 PM  Clinical Narrative:     Spoke with pt's wife at bedside. Plan for pt to transfer to [facility]. TOC team will follow for discharge plan to home.   Expected Discharge Plan: [place] Barriers to Discharge: No Barriers Identified  Expected Discharge Plan and Services Expected Discharge Plan: [place] arrangements for the past 2 months: Single Family Home                                       Social Determinants of Health (SDOH) Interventions    Readmission Risk Interventions No flowsheet data found.

## 2021-01-18 NOTE — Significant Event (Signed)
Rapid Response Event Note   Reason for Call :  NIH stroke screen needed  Initial Focused Assessment:   Patient alert, oriented to self, able to state correct day of the week and see time on wall clock, but does not answer any other orientation questions correctly. See NIH documentation.   Interventions:  NIH completed.  Plan of Care:  Continue plan of care with expected transfer to neuro unit at [facility].    Selinda Michaels, RN

## 2021-01-18 NOTE — Evaluation (Signed)
Occupational Therapy Evaluation Patient Details Name: Timothy Barton MRN: [id number] DOB: [birth year] Today's Date: 01/18/2021    History of Present Illness Timothy Barton is a 70 y.o. 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 MRI imaging shows patchy and confluent acute to early subacute ischemic infarcts  involving the left cerebral hemisphere as above. Associated  petechial hemorrhage without frank intraparenchymal hematoma or  significant mass effect. and a Few additional subcentimeter acute to early subacute ischemic  infarcts involving the subcortical right frontal lobe and superior  left cerebellum.   Clinical Impression   Timothy Barton is a 70 year old man who presents s/p cerebral infarctions with visual, communication and cognitive impairments. On evaluation he is disoriented to place, time and situation. Patient exhibits some difficulty answering therapist questions  - at least partially - requiring therapist to reword, repeat, simplify, or abort questions. Patient does well with visual cues. Patient doesn't exhibits any focal muscular neurological deficits - exhibiting normal ROM, strength and coordination in the upper extremities. Patient admitted with complaints of double vision - he reports it is better this morning. He was unable to read a clock or large type on the menu effectively. Patient able to assist with ADLs this morning but needing some assist for lower body dressing and toileting - predominantly more from hindrance of lines/leads and body habitus than a physical impairment. Patient will benefit from skilled OT services while in hospital to improve deficits and learn compensatory  strategies as needed in order to return to PLOF.      Follow Up Recommendations  Home health OT    Equipment Recommendations  None recommended by OT    Recommendations for Other Services Speech consult (Speech Language Evaluation)     Precautions / Restrictions Precautions Precautions: None Restrictions Weight Bearing Restrictions: No      Mobility Bed Mobility Overal bed mobility: Needs Assistance Bed Mobility: Supine to Sit;Sit to Supine       Sit to supine: Supervision        Transfers Overall transfer level: Needs assistance Equipment used: Rolling walker (2 wheeled) Transfers: Sit to/from [employer] Sit to Stand: Min guard Stand pivot transfers: Min guard       General transfer comment: Min guard to stand and ambulate to bathroom with RW. Unsure if he uses walker at home. Keeps it too far in front of him. No overt loss of balance or visual difficulties navigating terrain. Able to perform toilet transfer and make turn in small bathroom.    Balance Overall balance assessment: Mild deficits observed, not formally tested                                         ADL either performed or assessed with clinical judgement   ADL Overall ADL's : Needs assistance/impaired Eating/Feeding: Set up;Sitting   Grooming: Set up;Sitting;Cueing for sequencing   Upper Body Bathing: Set up;Sitting;Supervision/ safety;Cueing for sequencing   Lower Body Bathing: Min guard;Sit to/from stand;Cueing for sequencing   Upper Body Dressing : Set up;Sitting   Lower Body Dressing: Moderate assistance;Sit to/from stand Lower Body Dressing Details (indicate cue type and reason): assistance for socks - limited by body habitus,  demonstrates ability to manage clothing from mid calf Toilet Transfer: Pharmacist, community and Hygiene: Moderate assistance Toileting - Clothing Manipulation Details (indicate  cue type and reason): demonstrates ability to manage clothing. Needed assistance for perianal care - limited by presece of lines/leads/male external catheter.     Functional mobility during ADLs: Min guard;Rolling walker       Vision Patient Visual Report: Diplopia Vision Assessment?: Vision impaired- to be further tested in functional context Additional Comments: Reports improvmeent of double vision but unable to to detailedly describe. Due to communication deficits visual assessment limited. Patient able to visually track, grossly functional saccades, grossly able to to navigate in room, Grossly able to locate and name correct number of fingres on left and right (with visual cue to look further to the left). Patient unable to read clock even when therapist brought it close to his face. - stating something to the effect of "two days to ger around." Difficulty with reading large print on front of menu. Unable to assess peripheral vision or for presence of hemionopsia (considering how poorly he did with reading). Will continue to assess.     Perception     Praxis      Pertinent Vitals/Pain Pain Assessment: No/denies pain     Hand Dominance Right   Extremity/Trunk Assessment Upper Extremity Assessment Upper Extremity Assessment: RUE deficits/detail;LUE deficits/detail RUE Deficits / Details: WNL ROM, 5/5 strength RUE Sensation: WNL RUE Coordination: WNL LUE Deficits / Details: WNL ROM, 5/5 strength LUE Sensation: WNL LUE Coordination: WNL   Lower Extremity Assessment Lower Extremity Assessment: Defer to PT evaluation   Cervical / Trunk Assessment Cervical / Trunk Assessment: Normal   Communication Communication Communication: Receptive difficulties;Expressive difficulties   Cognition Arousal/Alertness: Awake/alert Behavior During Therapy: WFL for tasks assessed/performed Overall Cognitive Status: Impaired/Different from baseline Area of Impairment:  Orientation;Attention;Memory;Following commands;Safety/judgement;Awareness                 Orientation Level: Place;Time;Situation;Disoriented to Current Attention Level: Sustained   Following Commands: Follows one step commands inconsistently Safety/Judgement: Decreased awareness of deficits Awareness: Emergent Problem Solving:  (visual cues) General Comments: 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 Did well with visual cues.   General Comments       Exercises     Shoulder Instructions      Home Living Family/patient expects to be discharged to:: Unsure Living Arrangements: Spouse/significant other                               Additional Comments: Due to aphasia interview with patient difficult. Will need to follow up with spouse in regards to detailed PLOF and home environment.      Prior Functioning/Environment          Comments: Appears to have been predominantly independent. Has a walker at home - don't know if he uses it regularly.        OT Problem List: Impaired vision/perception;Decreased safety awareness;Decreased cognition;Decreased knowledge of use of DME or AE;Obesity;Increased edema      OT Treatment/Interventions: Self-care/ADL training;Neuromuscular education;Patient/family education;Visual/perceptual remediation/compensation;Cognitive remediation/compensation;Therapeutic activities;Balance training;DME and/or AE instruction    OT Goals(Current goals can be found in the care plan section) Acute Rehab OT Goals OT Goal Formulation: Patient unable to participate in goal setting Time For Goal Achievement: 02/01/21 Potential  to Achieve Goals: Good  OT Frequency: Min 2X/week   Barriers to D/C:            Co-evaluation              AM-PAC OT "6 Clicks" Daily Activity     Outcome Measure Help from  another person eating meals?: A Little Help from another person taking care of personal grooming?: A Little Help from another person toileting, which includes using toliet, bedpan, or urinal?: A Lot Help from another person bathing (including washing, rinsing, drying)?: A Little Help from another person to put on and taking off regular upper body clothing?: A Little Help from another person to put on and taking off regular lower body clothing?: A Lot 6 Click Score: 16   End of Session Equipment Utilized During Treatment: Rolling walker Nurse Communication: Mobility status  Activity Tolerance: Patient tolerated treatment well Patient left: in bed;with call bell/phone within reach;with bed alarm set  OT Visit Diagnosis: Cognitive communication deficit (R41.841) Symptoms and signs involving cognitive functions: Cerebral infarction                Time: [id number] OT Time Calculation (min): 27 min Charges:  OT General Charges $OT Visit: 1 Visit OT Evaluation $OT Eval Moderate Complexity: 1 Mod OT Treatments $Self Care/Home Management : 8-22 mins  Gizell Danser, OTR/L [place]  Office [phone] Pager: [phone]   Lenward Chancellor 01/18/2021, 9:18 AM

## 2021-01-18 NOTE — Plan of Care (Signed)
  Problem: Clinical Measurements: Goal: Respiratory complications will improve Outcome: Progressing   Problem: Elimination: Goal: Will not experience complications related to bowel motility Outcome: Progressing Goal: Will not experience complications related to urinary retention Outcome: Progressing   Problem: Pain Managment: Goal: General experience of comfort will improve Outcome: Progressing   

## 2021-01-18 NOTE — Progress Notes (Signed)
Carotid duplex has been completed.   Preliminary results in CV Proc.   Abram Sander 01/18/2021 8:40 AM

## 2021-01-18 NOTE — Evaluation (Signed)
Physical Therapy Evaluation Patient Details Name: Timothy Barton MRN: [id number] DOB: 1951-03-02 Today's Date: 01/18/2021   History of Present Illness  COLBY ROGAN is a 70 y.o. male presenting to the emergency room for evaluation of altered mental status. MRI imaging shows patchy and confluent acute to early subacute ischemic infarcts  involving the left cerebral hemisphere as above. Associated  petechial hemorrhage without frank intraparenchymal hematoma or  significant mass effect. and a Few additional subcentimeter acute to early subacute ischemic  infarcts involving the subcortical right frontal lobe and superior  left cerebellum. 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 Pt's spouse in room answering PLOF and baseline questions; reports pt independent, completes yardwork, grocery shops for elder siblings, runs errands for family, retired. Pt initially requesting to rest, then states urgent BM need so provided [facility] and assisted pt to [facility] with min guard for safety. Pt able to take limited steps a bedside with increased lateral weight shifting, no foot drop or knee buckling noted; min guard for therapist managing lines/cords for safety. Pt with difficulty following commands and answering questions at times, appears to do better with visual pointing, unable to state spouse's name and instead repeats own name and appears to become frustrated with questions despite rewording. Though, at times pt able to answer appropriately and follow verbal command without visual cue. Pt assisted back to supine, daughter and spouse in room with MD entering room at [place]. Pt with BP 111/79, denies dizziness, lightheadedness, pain- RN notified. Pt currently with functional  limitations due to the deficits listed below (see PT Problem List). Pt will benefit from skilled PT to increase their independence and safety with mobility to allow discharge to the venue listed below.       Follow Up Recommendations Home health PT;Supervision - Intermittent    Equipment Recommendations  None recommended by PT    Recommendations for Other Services       Precautions / Restrictions Precautions Precautions: Fall Restrictions Weight Bearing Restrictions: No      Mobility  Bed Mobility Overal bed mobility: Needs Assistance Bed Mobility: Supine to Sit;Sit to Supine  Supine to sit: Supervision Sit to supine: Supervision   General bed mobility comments: powers up to sitting without assist or VCs, hands using bedrail to assist trunk into sitting    Transfers Overall transfer level: Needs assistance Equipment used: None Transfers: Sit to/from [employer] Transfers Sit to Stand: Min guard Stand pivot transfers: Min guard  General transfer comment: pt powers to stand with BUE assisting, pivots to [facility] without LOB, therapist managing all lines for safety, no LOB  Ambulation/Gait  Assistive device: None  Gait velocity: decreased  General Gait Details: limited to steps at bedside in front of BSC, increased lateral weight-shifting in waddle-type gait that spouse reports is baseline, no LOB; further ambulation not attempted due to pt wanting to rest  Stairs            Wheelchair Mobility    Modified Rankin (Stroke Patients Only)       Balance Overall balance assessment: Mild deficits observed, not formally tested        Pertinent Vitals/Pain Pain Assessment: No/denies pain    Home Living Family/patient expects to be discharged to:: Private residence Living Arrangements: Spouse/significant other Available Help at Discharge: Family;Friend(s);Available PRN/intermittently Type of Home:  Mobile home Home Access: Stairs to enter Entrance  Stairs-Rails: Right Entrance Stairs-Number of Steps: 8-10 Home Layout: One level Home Equipment: None Additional Comments: All information provided by pt's spouse Lujean Rave    Prior Function Level of Independence: Independent         Comments: Spouse reports pt independent with community ambulation, ADLs/IADLs, drives, grocery shops for elder siblings.     Hand Dominance   Dominant Hand: Right    Extremity/Trunk Assessment   Upper Extremity Assessment Upper Extremity Assessment: Defer to OT evaluation RUE Deficits / Details: WNL ROM, 5/5 strength RUE Sensation: WNL RUE Coordination: WNL LUE Deficits / Details: WNL ROM, 5/5 strength LUE Sensation: WNL LUE Coordination: WNL    Lower Extremity Assessment Lower Extremity Assessment: RLE deficits/detail;LLE deficits/detail RLE Deficits / Details: AROM WNL, strength grossly 4/5 throughout, no foot drop noted RLE Sensation: WNL RLE Coordination: WNL LLE Deficits / Details: AROM WNL, strength grossly 4/5 throughout, no foot drop noted LLE Sensation: WNL LLE Coordination: WNL    Cervical / Trunk Assessment Cervical / Trunk Assessment: Normal  Communication   Communication: Receptive difficulties;Expressive difficulties  Cognition Arousal/Alertness: Awake/alert Behavior During Therapy: WFL for tasks assessed/performed Overall Cognitive Status: Impaired/Different from baseline Area of Impairment: Following commands;Safety/judgement        Following Commands: Follows one step commands inconsistently Safety/Judgement: Decreased awareness of deficits   Problem Solving:  (visual cues) General Comments: Pt states name appropriatley. Pt states own name when asked who his wife and daughter in room are. Pt requires simple commands, repeated commands, visual pointing, but also doesn't always understand what therapist is asking/directing.      General Comments      Exercises     Assessment/Plan    PT Assessment Patient  needs continued PT services  PT Problem List Decreased activity tolerance;Decreased cognition;Decreased safety awareness       PT Treatment Interventions DME instruction;Gait training;Stair training;Functional mobility training;Therapeutic activities;Therapeutic exercise;Balance training;Cognitive remediation;Patient/family education    PT Goals (Current goals can be found in the Care Plan section)  Acute Rehab PT Goals Patient Stated Goal: back to baseline per spouse PT Goal Formulation: With family Time For Goal Achievement: 02/01/21 Potential to Achieve Goals: Good    Frequency Min 3X/week   Barriers to discharge        Co-evaluation               AM-PAC PT "6 Clicks" Mobility  Outcome Measure Help needed turning from your back to your side while in a flat bed without using bedrails?: A Little Help needed moving from lying on your back to sitting on the side of a flat bed without using bedrails?: A Little Help needed moving to and from a bed to a chair (including a wheelchair)?: A Little Help needed standing up from a chair using your arms (e.g., wheelchair or bedside chair)?: A Little Help needed to walk in hospital room?: A Little Help needed climbing 3-5 steps with a railing? : A Little 6 Click Score: 18    End of Session   Activity Tolerance: Patient tolerated treatment well Patient left: in bed;with call bell/phone within reach;with bed alarm set;with nursing/sitter in room;with family/visitor present Nurse Communication: Mobility status;Other (comment) (BP 111/79 and no symptoms) PT Visit Diagnosis: Other symptoms and signs involving the nervous system [id number])    Time: [id number] PT Time Calculation (min) (ACUTE ONLY): 27 min   Charges:   PT Evaluation $PT Eval Moderate Complexity: 1 Mod PT Treatments $Therapeutic Activity:  8-22 mins         Talbot Grumbling PT, DPT 01/18/21, 1:00 PM

## 2021-01-18 NOTE — Progress Notes (Addendum)
[facility]                                                                              Patient Demographics  Timothy Barton, is a 70 y.o. male, DOB - 1951-02-23, [id number]  Admit date - 01/17/2021   Admitting Physician Orene Desanctis, DO  Outpatient Primary MD for the patient is Medicine, [facility]  Outpatient specialists:   LOS - 1  days   Medical records reviewed and are as summarized below:    Chief Complaint  Patient presents with   Altered Mental Status       Brief summary   Patient is a 70 year old male with newly diagnosed multifocal [place] with suspected metastasis, known alcoholic fatty liver, liver cirrhosis, hypertension, OSA, obesity presented with concerns for altered mental status. Per wife, patient was alert and oriented to self only and a day before the admission he was in normal state of health.  He had gone to cancer center and drove himself to get a haircut.  However on the day of admission he woke up and began to say things that do not make sense.  Patient also reported split vision when he tried to see with both eyes, did not recognize his wife and could barely follow commands. No nausea vomiting or diarrhea, chronically has cough for the past month and a half.  No fevers. In ED, UA positive for UTI.  Patient was started on IV Rocephin.  Ammonia level normal. Creatinine 1.68 from prior of 1.15. MRI brain showed acute to early subacute ischemic infarcts involving the left cerebral hemisphere, associated petechial hemorrhage without intraparenchymal hematoma or mass-effect.  Few additional subcentimeter acute to early subacute ischemic infarcts involving the subcortical right frontal lobe and superior left cerebellum.  Assessment & Plan    Principal Problem:   Acute CVA (cerebrovascular accident) ([place]) -MRI of the brain showed acute to early subacute ischemic infarcts involving left cerebral hemisphere with petechial hemorrhage, early  subacute ischemic infarcts involving subcortical right frontal lobe, superior left cerebellum -Still has some confusion with expressive aphasia otherwise no focal weakness on motor exam -Neurology consulted, recommended to hold aspirin for now due to petechial hemorrhage -MRA showed no proximal intracranial vessel occlusion or significant stenosis -Carotid Dopplers with 1 to 39% stenosis of right and left carotid artery -2D echo pending -SLP evaluation pending -LDL 73, hemoglobin A1c pending -Currently awaiting transfer to [facility] for further stroke work-up  -Still n.p.o., will place on gentle hydration until started on diet  Active Problems: Urinary tract infection with acute metabolic encephalopathy -Urine culture showed more than 100,000 colonies of gram-negative rods -Continue IV Rocephin, follow cultures and sensitivities    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 Burr Medico     AKI (acute kidney injury) [facility]) -Creatinine 1.68 on admission, was 1.1 on 7/20 -Continue IV fluid hydration    Anemia with Thrombocytopenia (HCC), chronic - PLT count improving, H&H currently close to baseline   Obesity Estimated body mass index is 40.92 kg/m as calculated from the following:   Height as of this encounter: '5\' 9"'$  (1.753 m).   Weight as  of this encounter: 125.7 kg.  Code Status: Full  DVT Prophylaxis:  enoxaparin (LOVENOX) injection 40 mg Start: 01/17/21 2000   Level of Care: Level of care: Med-Surg Family Communication: Discussed all imaging results, lab results, explained to the patient and wife on phone.    Disposition Plan:     Status is: Observation  The patient will require care spanning > 2 midnights and should be moved to inpatient because: Inpatient level of care appropriate due to severity of illness  Dispo: The patient is from: Home              Anticipated d/c is to: Home              Patient currently is not medically  stable to d/c.   Difficult to place patient No      Time Spent in minutes   27mns   Procedures:  MRI brain, MRA  Consultants:   Neurology  Antimicrobials:   Anti-infectives (From admission, onward)    Start     Dose/Rate Route Frequency Ordered Stop   01/17/21 2000  cefTRIAXone (ROCEPHIN) 1 g in sodium chloride 0.9 % 100 mL IVPB        1 g 200 mL/hr over 30 Minutes Intravenous Every 24 hours 01/17/21 1935     01/17/21 1930  cefTRIAXone (ROCEPHIN) 1 g in sodium chloride 0.9 % 100 mL IVPB  Status:  Discontinued        1 g 200 mL/hr over 30 Minutes Intravenous  Once 01/17/21 1924 01/17/21 1948          Medications  Scheduled Meds:   stroke: mapping our early stages of recovery book   Does not apply Once   enoxaparin (LOVENOX) injection  40 mg Subcutaneous Q24H   Continuous Infusions:  cefTRIAXone (ROCEPHIN)  IV 1 g (01/17/21 2133)   PRN Meds:.      Subjective:   DJymere Squierswas seen and examined today.  Still has expressive aphasia, garbled speech although following commands.  Difficult to obtain review of system from the patient.  Currently no chest pain, shortness of breath, nausea or vomiting or diarrhea.  Objective:   Vitals:   01/18/21 0704 01/18/21 0800 01/18/21 1000 01/18/21 1200  BP:  128/71 128/68 94/84  Pulse:  87 95 96  Resp:  13 (!) 22 (!) 21  Temp:  98.5 F (36.9 C)  97.9 F (36.6 C)  TempSrc:  Oral  Oral  SpO2:  98% 92% 94%  Weight: 125.7 kg     Height:        Intake/Output Summary (Last 24 hours) at 01/18/2021 1330 Last data filed at 01/18/2021 0800 Gross per 24 hour  Intake 1736.78 ml  Output 400 ml  Net 1336.78 ml     Wt Readings from Last 3 Encounters:  01/18/21 125.7 kg  01/17/21 124.6 kg  01/10/21 120.8 kg     Exam General: Alert and awake, expressive aphasia Cardiovascular: S1 S2 auscultated, RRR Respiratory: Clear to auscultation bilaterally, no wheezing, rales or rhonchi Gastrointestinal: Soft, nontender,  nondistended, + bowel sounds Ext: no pedal edema bilaterally Neuro: motor strength 5/5 in both upper and lower extremities Skin: No rashes Psych: Normal affect and demeanor, alert and oriented x3    Data Reviewed:  I have personally reviewed following labs and imaging studies  Micro Results Recent Results (from the past 240 hour(s))  Urine Culture     Status: Abnormal (Preliminary result)   Collection Time: 01/17/21  1:30  PM   Specimen: Urine, Clean Catch  Result Value Ref Range Status   Specimen Description   Final    URINE, CLEAN CATCH Performed at [facility], [place] [address], [place], [place] [address]    Special Requests   Final    NONE Performed at [facility], [place] [address], [place], [place] [address]    Culture (A)  Final    >=100,000 COLONIES/mL GRAM NEGATIVE RODS SUSCEPTIBILITIES TO FOLLOW Performed at [facility], [place] [address] [place], [place] [address]    Report Status PENDING  Incomplete  SARS CORONAVIRUS 2 (TAT 6-24 HRS) Nasopharyngeal Nasopharyngeal Swab     Status: None   Collection Time: 01/17/21  8:24 PM   Specimen: Nasopharyngeal Swab  Result Value Ref Range Status   SARS Coronavirus 2 NEGATIVE NEGATIVE Final    Comment: (NOTE) SARS-CoV-2 target nucleic acids are NOT DETECTED.  The SARS-CoV-2 RNA is generally detectable in upper and lower respiratory specimens during the acute phase of infection. Negative results do not preclude SARS-CoV-2 infection, do not rule out co-infections with other pathogens, and should not be used as the sole basis for treatment or other patient management decisions. Negative results must be combined with clinical observations, patient history, and epidemiological information. The expected result is Negative.  Fact Sheet for Patients: SugarRoll.be  Fact Sheet for Healthcare  Providers: https://www.woods-mathews.com/  This test is not yet approved or cleared by the [place] FDA and  has been authorized for detection and/or diagnosis of SARS-CoV-2 by FDA under an Emergency Use Authorization (EUA). This EUA will remain  in effect (meaning this test can be used) for the duration of the COVID-19 declaration under Se ction 564(b)(1) of the Act, 21 U.S.C. section 360bbb-3(b)(1), unless the authorization is terminated or revoked sooner.  Performed at [facility], [place] [address] [place], [place] [address]     Radiology Reports DG Chest 2 View  Result Date: 01/17/2021 CLINICAL DATA:  acute mental status change, cancer, r/o infection EXAM: CHEST - 2 VIEW COMPARISON:  12/13/2020 FINDINGS: Cardiomediastinal contours are within normal limits. Atherosclerotic calcification of the aortic knob. Small bilateral pleural effusions with associated hazy bibasilar opacities. No pneumothorax. IMPRESSION: Small bilateral pleural effusions with associated hazy bibasilar opacities which may represent atelectasis versus infection. Electronically Signed   By: Davina Poke D.O.   On: 01/17/2021 14:57   MR ANGIO HEAD WO CONTRAST  Result Date: 01/18/2021 CLINICAL DATA:  Stroke, follow-up EXAM: MRA HEAD WITHOUT CONTRAST TECHNIQUE: Angiographic images of the Circle of Willis were acquired using MRA technique without intravenous contrast. COMPARISON:  No pertinent prior exam. FINDINGS: Intracranial internal carotid arteries are patent. Middle and anterior cerebral arteries are patent. Intracranial vertebral arteries, basilar artery, posterior cerebral arteries are patent. Bilateral posterior communicating arteries are present. There is no significant stenosis or aneurysm. IMPRESSION: No proximal intracranial vessel occlusion or significant stenosis. Electronically Signed   By: Macy Mis M.D.   On: 01/18/2021 08:07   MR BRAIN W WO CONTRAST  Result Date:  01/17/2021 CLINICAL DATA:  Initial evaluation for altered mental status, unknown cause. EXAM: MRI HEAD WITHOUT AND WITH CONTRAST TECHNIQUE: Multiplanar, multiecho pulse sequences of the brain and surrounding structures were obtained without and with intravenous contrast. CONTRAST:  49m GADAVIST GADOBUTROL 1 MMOL/ML IV SOLN COMPARISON:  CT from 11/25/2020. FINDINGS: Brain: Generalized age-related cerebral atrophy. Scattered patchy T2/FLAIR hyperintensity within the periventricular deep white matter both cerebral hemispheres most consistent  with chronic small vessel ischemic disease, mild in nature. Small remote right cerebellar infarct noted. Patchy and confluent restricted diffusion involving the cortical and subcortical aspects of the left frontal, parietal, occipital, and temporal lobes, consistent with acute to early subacute ischemic infarcts. Largest area of involvement present at the left temporal lobe and measures up to 6 cm in diameter. Associated petechial hemorrhage without frank intraparenchymal hematoma or significant mass effect. Few additional subcentimeter acute to early subacute ischemic infarcts noted at the subcortical right frontal lobe (series 5, image 85) and superior left cerebellum (series 5, image 62). Gray-white matter differentiation otherwise maintained. No other evidence for acute or chronic intracranial hemorrhage. No mass lesion, midline shift, or significant mass effect. No hydrocephalus or extra-axial fluid collection. Pituitary gland suprasellar region within normal limits. No abnormal enhancement or evidence for intracranial metastatic disease. Vascular: Major intracranial vascular flow voids are maintained. Skull and upper cervical spine: Craniocervical junction within normal limits. Multilevel degenerative spondylosis noted within the upper cervical spine with no more than mild spinal stenosis at C3-4. Bone marrow signal intensity heterogeneous without focal marrow replacing  lesion. No scalp soft tissue abnormality. Sinuses/Orbits: Globes and orbital soft tissues within normal limits. Paranasal sinuses are largely clear. No significant mastoid effusion. Inner ear structures grossly normal. Other: None. IMPRESSION: 1. Patchy and confluent acute to early subacute ischemic infarcts involving the left cerebral hemisphere as above. Associated petechial hemorrhage without frank intraparenchymal hematoma or significant mass effect. 2. Few additional subcentimeter acute to early subacute ischemic infarcts involving the subcortical right frontal lobe and superior left cerebellum. 3. No other acute intracranial abnormality. No evidence for intracranial metastatic disease. 4. Underlying age-related cerebral atrophy with mild chronic small vessel ischemic disease. Electronically Signed   By: Jeannine Boga M.D.   On: 01/17/2021 21:18   US Abdomen Limited  Result Date: 01/10/2021 CLINICAL DATA:  Liver lesions, cirrhosis EXAM: LIMITED ABDOMEN ULTRASOUND FOR ASCITES TECHNIQUE: Limited ultrasound survey for ascites was performed in all four abdominal quadrants. COMPARISON:  MR 12/29/2020 FINDINGS: Scattered small-volume abdominal ascites as before. Right lobe liver mass as described on previous MR. IMPRESSION: Small volume abdominal ascites Electronically Signed   By: Lucrezia Europe M.D.   On: 01/10/2021 15:03   VAS US CAROTID (at [facility] and [facility] only)  Result Date: 01/18/2021 Carotid Arterial Duplex Study Patient Name:  GRISHAM FAIRLEY  Date of Exam:   01/18/2021 Medical Rec #: [id number]         Accession #:    [id number] Date of Birth: [birth year]         Patient Gender: M Patient Age:   70Y Exam Location:  [facility] Procedure:      VAS US CAROTID Referring Phys: [id number] [place] T TU --------------------------------------------------------------------------------  Indications:       CVA. Risk Factors:      Hypertension. Comparison Study:  no prior Performing Technologist: Archie Patten  RVS  Examination Guidelines: A complete evaluation includes B-mode imaging, spectral Doppler, color Doppler, and power Doppler as needed of all accessible portions of each vessel. Bilateral testing is considered an integral part of a complete examination. Limited examinations for reoccurring indications may be performed as noted.  Right Carotid Findings: +----------+--------+--------+--------+-------------------------+---------+           PSV cm/sEDV cm/sStenosisPlaque Description       Comments  +----------+--------+--------+--------+-------------------------+---------+ CCA Prox  112     16              heterogenous                       +----------+--------+--------+--------+-------------------------+---------+  CCA Distal93      24              heterogenous and calcificShadowing +----------+--------+--------+--------+-------------------------+---------+ ICA Prox  87      22      1-39%   heterogenous                       +----------+--------+--------+--------+-------------------------+---------+ ICA Distal105     24                                                 +----------+--------+--------+--------+-------------------------+---------+ ECA       119     14                                                 +----------+--------+--------+--------+-------------------------+---------+ +----------+--------+-------+--------+-------------------+           PSV cm/sEDV cmsDescribeArm Pressure (mmHG) +----------+--------+-------+--------+-------------------+ [id number]                                         +----------+--------+-------+--------+-------------------+ +---------+--------+--+--------+--+---------+ VertebralPSV cm/s51EDV cm/s10Antegrade +---------+--------+--+--------+--+---------+  Left Carotid Findings: +----------+--------+--------+--------+------------------+--------+           PSV cm/sEDV cm/sStenosisPlaque DescriptionComments  +----------+--------+--------+--------+------------------+--------+ CCA Prox  93      17              heterogenous               +----------+--------+--------+--------+------------------+--------+ CCA Distal83      18              heterogenous               +----------+--------+--------+--------+------------------+--------+ ICA Prox  85      30      1-39%   heterogenous               +----------+--------+--------+--------+------------------+--------+ ICA Distal79      24                                         +----------+--------+--------+--------+------------------+--------+ ECA       101     9                                          +----------+--------+--------+--------+------------------+--------+ +----------+--------+--------+--------+-------------------+           PSV cm/sEDV cm/sDescribeArm Pressure (mmHG) +----------+--------+--------+--------+-------------------+ [id number]                                          +----------+--------+--------+--------+-------------------+ +---------+--------+--+--------+-+---------+ VertebralPSV cm/s36EDV cm/s9Antegrade +---------+--------+--+--------+-+---------+   Summary: Right Carotid: Velocities in the right ICA are consistent with a 1-39% stenosis. Left Carotid: Velocities in the left ICA are consistent with a 1-39% stenosis. Vertebrals: Bilateral vertebral arteries demonstrate antegrade flow. *See table(s) above for measurements and observations.     Preliminary  CT OUTSIDE FILMS CHEST  Result Date: 01/09/2021 This examination belongs to an outside facility and is stored here for comparison purposes only.  Contact the originating outside institution for any associated report or interpretation.   Lab Data:  CBC: Recent Labs  Lab 01/17/21 1140  WBC 6.7  NEUTROABS 5.3  HGB 10.4*  HCT 31.8*  MCV 88.1  PLT 123XX123*   Basic Metabolic Panel: Recent Labs  Lab 01/17/21 1140 01/18/21 0402  NA 139  139  K 3.8 3.6  CL 103 105  CO2 26 26  GLUCOSE 105* 113*  BUN 49* 48*  CREATININE 1.68* 1.60*  CALCIUM 9.2 8.6*   GFR: Estimated Creatinine Clearance: 57.1 mL/min (A) (by C-G formula based on SCr of 1.6 mg/dL (H)). Liver Function Tests: Recent Labs  Lab 01/17/21 1140  AST 21  ALT 10  ALKPHOS 72  BILITOT 0.7  PROT 6.5  ALBUMIN 2.6*   No results for input(s): LIPASE, AMYLASE in the last 168 hours. Recent Labs  Lab 01/17/21 1139  AMMONIA 24   Coagulation Profile: No results for input(s): INR, PROTIME in the last 168 hours. Cardiac Enzymes: No results for input(s): CKTOTAL, CKMB, CKMBINDEX, TROPONINI in the last 168 hours. BNP (last 3 results) No results for input(s): PROBNP in the last 8760 hours. HbA1C: No results for input(s): HGBA1C in the last 72 hours. CBG: No results for input(s): GLUCAP in the last 168 hours. Lipid Profile: Recent Labs    01/18/21 0402  CHOL 115  HDL 25*  LDLCALC 73  TRIG 84  CHOLHDL 4.6   Thyroid Function Tests: Recent Labs    01/17/21 1139  TSH 1.067   Anemia Panel: No results for input(s): VITAMINB12, FOLATE, FERRITIN, TIBC, IRON, RETICCTPCT in the last 72 hours. Urine analysis:    Component Value Date/Time   COLORURINE YELLOW 01/17/2021 1330   APPEARANCEUR HAZY (A) 01/17/2021 1330   LABSPEC 1.016 01/17/2021 1330   PHURINE 5.0 01/17/2021 1330   GLUCOSEU NEGATIVE 01/17/2021 1330   HGBUR SMALL (A) 01/17/2021 1330   BILIRUBINUR NEGATIVE 01/17/2021 1330   KETONESUR NEGATIVE 01/17/2021 1330   PROTEINUR 30 (A) 01/17/2021 1330   PROTEINUR NEGATIVE 01/17/2021 1330   UROBILINOGEN 1.0 10/26/2014 1446   NITRITE POSITIVE (A) 01/17/2021 1330   LEUKOCYTESUR MODERATE (A) 01/17/2021 1330     Lisbet Busker M.D. [facility] 01/18/2021, 1:30 PM  Available via Epic secure chat 7am-7pm After 7 pm, please refer to night coverage provider listed on amion.

## 2021-01-18 NOTE — Progress Notes (Signed)
Timothy Barton   DOB:01-28-1951   [id number]   [id number]  Oncology follow up   Subjective: Patient is well-known to me, was admitted from my office to [facility] for acute altered mental status.  Brain MRI showed acute to early subacute ischemic infarct involving the left cerebral hemisphere with petechial hemorrhage.  Patient remains to be confused and dysphasic, no signal change from yesterday.  He worked with occupational therapy and physical therapy today.  Wife and daughter at the bedside.   Objective:  Vitals:   01/18/21 1200 01/18/21 1400  BP: 94/84 132/70  Pulse: 96 97  Resp: (!) 21 (!) 26  Temp: 97.9 F (36.6 C) 98.6 F (37 C)  SpO2: 94% 92%    Body mass index is 40.92 kg/m.  Intake/Output Summary (Last 24 hours) at 01/18/2021 1701 Last data filed at 01/18/2021 0800 Gross per 24 hour  Intake 1736.78 ml  Output 400 ml  Net 1336.78 ml    Alert, but could not answer questions appropriately  Sclerae unicteric  Oropharynx clear  No peripheral adenopathy  Lungs clear -- no rales or rhonchi  Heart regular rate and rhythm  Abdomen distended     CBG (last 3)  No results for input(s): GLUCAP in the last 72 hours.   Labs:   Urine Studies No results for input(s): UHGB, CRYS in the last 72 hours.  Invalid input(s): UACOL, UAPR, USPG, UPH, UTP, UGL, UKET, UBIL, UNIT, UROB, ULEU, UEPI, UWBC, URBC, UBAC, CAST, UCOM, BILUA  Basic Metabolic Panel: Recent Labs  Lab 01/17/21 1140 01/18/21 0402  NA 139 139  K 3.8 3.6  CL 103 105  CO2 26 26  GLUCOSE 105* 113*  BUN 49* 48*  CREATININE 1.68* 1.60*  CALCIUM 9.2 8.6*   GFR Estimated Creatinine Clearance: 57.1 mL/min (A) (by C-G formula based on SCr of 1.6 mg/dL (H)). Liver Function Tests: Recent Labs  Lab 01/17/21 1140  AST 21  ALT 10  ALKPHOS 72  BILITOT 0.7  PROT 6.5  ALBUMIN 2.6*   No results for input(s): LIPASE, AMYLASE in the last 168 hours. Recent Labs  Lab 01/17/21 1139  AMMONIA  24   Coagulation profile No results for input(s): INR, PROTIME in the last 168 hours.  CBC: Recent Labs  Lab 01/17/21 1140  WBC 6.7  NEUTROABS 5.3  HGB 10.4*  HCT 31.8*  MCV 88.1  PLT 117*   Cardiac Enzymes: No results for input(s): CKTOTAL, CKMB, CKMBINDEX, TROPONINI in the last 168 hours. BNP: Invalid input(s): POCBNP CBG: No results for input(s): GLUCAP in the last 168 hours. D-Dimer No results for input(s): DDIMER in the last 72 hours. Hgb A1c No results for input(s): HGBA1C in the last 72 hours. Lipid Profile Recent Labs    01/18/21 0402  CHOL 115  HDL 25*  LDLCALC 73  TRIG 84  CHOLHDL 4.6   Thyroid function studies Recent Labs    01/17/21 1139  TSH 1.067   Anemia work up No results for input(s): VITAMINB12, FOLATE, FERRITIN, TIBC, IRON, RETICCTPCT in the last 72 hours. Microbiology Recent Results (from the past 240 hour(s))  Urine Culture     Status: Abnormal (Preliminary result)   Collection Time: 01/17/21  1:30 PM   Specimen: Urine, Clean Catch  Result Value Ref Range Status   Specimen Description   Final    URINE, CLEAN CATCH Performed at [facility], [place] [address] [place], [place] [address]    Special Requests  Final    NONE Performed at [facility], [place] [address] [place], [place] [address]    Culture (A)  Final    >=100,000 COLONIES/mL ESCHERICHIA COLI SUSCEPTIBILITIES TO FOLLOW Performed at [facility], [place] [address], [place], [place] [address]    Report Status PENDING  Incomplete  SARS CORONAVIRUS 2 (TAT 6-24 HRS) Nasopharyngeal Nasopharyngeal Swab     Status: None   Collection Time: 01/17/21  8:24 PM   Specimen: Nasopharyngeal Swab  Result Value Ref Range Status   SARS Coronavirus 2 NEGATIVE NEGATIVE Final    Comment: (NOTE) SARS-CoV-2 target nucleic acids are NOT DETECTED.  The SARS-CoV-2 RNA is generally detectable in upper and lower respiratory specimens during  the acute phase of infection. Negative results do not preclude SARS-CoV-2 infection, do not rule out co-infections with other pathogens, and should not be used as the sole basis for treatment or other patient management decisions. Negative results must be combined with clinical observations, patient history, and epidemiological information. The expected result is Negative.  Fact Sheet for Patients: SugarRoll.be  Fact Sheet for Healthcare Providers: https://www.woods-mathews.com/  This test is not yet approved or cleared by the [place] FDA and  has been authorized for detection and/or diagnosis of SARS-CoV-2 by FDA under an Emergency Use Authorization (EUA). This EUA will remain  in effect (meaning this test can be used) for the duration of the COVID-19 declaration under Se ction 564(b)(1) of the Act, 21 U.S.C. section 360bbb-3(b)(1), unless the authorization is terminated or revoked sooner.  Performed at [facility], [place] [address] [place], [place] [address]       Studies:  DG Chest 2 View  Result Date: 01/17/2021 CLINICAL DATA:  acute mental status change, cancer, r/o infection EXAM: CHEST - 2 VIEW COMPARISON:  12/13/2020 FINDINGS: Cardiomediastinal contours are within normal limits. Atherosclerotic calcification of the aortic knob. Small bilateral pleural effusions with associated hazy bibasilar opacities. No pneumothorax. IMPRESSION: Small bilateral pleural effusions with associated hazy bibasilar opacities which may represent atelectasis versus infection. Electronically Signed   By: Davina Poke D.O.   On: 01/17/2021 14:57   MR ANGIO HEAD WO CONTRAST  Result Date: 01/18/2021 CLINICAL DATA:  Stroke, follow-up EXAM: MRA HEAD WITHOUT CONTRAST TECHNIQUE: Angiographic images of the Circle of Willis were acquired using MRA technique without intravenous contrast. COMPARISON:  No pertinent prior exam. FINDINGS: Intracranial  internal carotid arteries are patent. Middle and anterior cerebral arteries are patent. Intracranial vertebral arteries, basilar artery, posterior cerebral arteries are patent. Bilateral posterior communicating arteries are present. There is no significant stenosis or aneurysm. IMPRESSION: No proximal intracranial vessel occlusion or significant stenosis. Electronically Signed   By: Macy Mis M.D.   On: 01/18/2021 08:07   MR BRAIN W WO CONTRAST  Result Date: 01/17/2021 CLINICAL DATA:  Initial evaluation for altered mental status, unknown cause. EXAM: MRI HEAD WITHOUT AND WITH CONTRAST TECHNIQUE: Multiplanar, multiecho pulse sequences of the brain and surrounding structures were obtained without and with intravenous contrast. CONTRAST:  20m GADAVIST GADOBUTROL 1 MMOL/ML IV SOLN COMPARISON:  CT from 11/25/2020. FINDINGS: Brain: Generalized age-related cerebral atrophy. Scattered patchy T2/FLAIR hyperintensity within the periventricular deep white matter both cerebral hemispheres most consistent with chronic small vessel ischemic disease, mild in nature. Small remote right cerebellar infarct noted. Patchy and confluent restricted diffusion involving the cortical and subcortical aspects of the left frontal, parietal, occipital, and temporal lobes, consistent with acute to early subacute ischemic infarcts. Largest area of  involvement present at the left temporal lobe and measures up to 6 cm in diameter. Associated petechial hemorrhage without frank intraparenchymal hematoma or significant mass effect. Few additional subcentimeter acute to early subacute ischemic infarcts noted at the subcortical right frontal lobe (series 5, image 85) and superior left cerebellum (series 5, image 62). Gray-white matter differentiation otherwise maintained. No other evidence for acute or chronic intracranial hemorrhage. No mass lesion, midline shift, or significant mass effect. No hydrocephalus or extra-axial fluid collection.  Pituitary gland suprasellar region within normal limits. No abnormal enhancement or evidence for intracranial metastatic disease. Vascular: Major intracranial vascular flow voids are maintained. Skull and upper cervical spine: Craniocervical junction within normal limits. Multilevel degenerative spondylosis noted within the upper cervical spine with no more than mild spinal stenosis at C3-4. Bone marrow signal intensity heterogeneous without focal marrow replacing lesion. No scalp soft tissue abnormality. Sinuses/Orbits: Globes and orbital soft tissues within normal limits. Paranasal sinuses are largely clear. No significant mastoid effusion. Inner ear structures grossly normal. Other: None. IMPRESSION: 1. Patchy and confluent acute to early subacute ischemic infarcts involving the left cerebral hemisphere as above. Associated petechial hemorrhage without frank intraparenchymal hematoma or significant mass effect. 2. Few additional subcentimeter acute to early subacute ischemic infarcts involving the subcortical right frontal lobe and superior left cerebellum. 3. No other acute intracranial abnormality. No evidence for intracranial metastatic disease. 4. Underlying age-related cerebral atrophy with mild chronic small vessel ischemic disease. Electronically Signed   By: Jeannine Boga M.D.   On: 01/17/2021 21:18   VAS US CAROTID (at [facility] and [facility] only)  Result Date: 01/18/2021 Carotid Arterial Duplex Study Patient Name:  LEONTE RAVELING  Date of Exam:   01/18/2021 Medical Rec #: [id number]         Accession #:    [id number] Date of Birth: 01/20/1951         Patient Gender: M Patient Age:   70Y Exam Location:  [facility] Procedure:      VAS US CAROTID Referring Phys: [id number] [place] T TU --------------------------------------------------------------------------------  Indications:       CVA. Risk Factors:      Hypertension. Comparison Study:  no prior Performing Technologist: Archie Patten RVS   Examination Guidelines: A complete evaluation includes B-mode imaging, spectral Doppler, color Doppler, and power Doppler as needed of all accessible portions of each vessel. Bilateral testing is considered an integral part of a complete examination. Limited examinations for reoccurring indications may be performed as noted.  Right Carotid Findings: +----------+--------+--------+--------+-------------------------+---------+           PSV cm/sEDV cm/sStenosisPlaque Description       Comments  +----------+--------+--------+--------+-------------------------+---------+ CCA Prox  112     16              heterogenous                       +----------+--------+--------+--------+-------------------------+---------+ CCA Distal93      24              heterogenous and calcificShadowing +----------+--------+--------+--------+-------------------------+---------+ ICA Prox  87      22      1-39%   heterogenous                       +----------+--------+--------+--------+-------------------------+---------+ ICA Distal105     24                                                 +----------+--------+--------+--------+-------------------------+---------+  ECA       119     14                                                 +----------+--------+--------+--------+-------------------------+---------+ +----------+--------+-------+--------+-------------------+           PSV cm/sEDV cmsDescribeArm Pressure (mmHG) +----------+--------+-------+--------+-------------------+ [id number]                                         +----------+--------+-------+--------+-------------------+ +---------+--------+--+--------+--+---------+ VertebralPSV cm/s51EDV cm/s10Antegrade +---------+--------+--+--------+--+---------+  Left Carotid Findings: +----------+--------+--------+--------+------------------+--------+           PSV cm/sEDV cm/sStenosisPlaque DescriptionComments  +----------+--------+--------+--------+------------------+--------+ CCA Prox  93      17              heterogenous               +----------+--------+--------+--------+------------------+--------+ CCA Distal83      18              heterogenous               +----------+--------+--------+--------+------------------+--------+ ICA Prox  85      30      1-39%   heterogenous               +----------+--------+--------+--------+------------------+--------+ ICA Distal79      24                                         +----------+--------+--------+--------+------------------+--------+ ECA       101     9                                          +----------+--------+--------+--------+------------------+--------+ +----------+--------+--------+--------+-------------------+           PSV cm/sEDV cm/sDescribeArm Pressure (mmHG) +----------+--------+--------+--------+-------------------+ [id number]                                          +----------+--------+--------+--------+-------------------+ +---------+--------+--+--------+-+---------+ VertebralPSV cm/s36EDV cm/s9Antegrade +---------+--------+--+--------+-+---------+   Summary: Right Carotid: Velocities in the right ICA are consistent with a 1-39% stenosis. Left Carotid: Velocities in the left ICA are consistent with a 1-39% stenosis. Vertebrals: Bilateral vertebral arteries demonstrate antegrade flow. *See table(s) above for measurements and observations.     Preliminary     Assessment: 70 y.o. 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  He is eager to drink and eat -His TTE was ordered as part of stroke work-up, still pending -He will be transferred to [facility] for neurology f/u and work up. I spoke with on-call neurologist Dr. Cheral Marker, he would like to have all work-up completed, before they can talk to family about his prognosis  from stroke -Due to his diffuse metastatic malignancy, I recommended to avoid invasive work-up for stroke, given his overall guarded prognosis from cancer standpoint. -His stroke could be related to his underlying malignancy, due to hypercoagulopathy.  If neurology thinks there is a value for anticoagulation, I think that is reasonable, but would likely hold it for now due to his small hemorrhage. -He was scheduled for IR biopsy tomorrow, his appointment has been cancelled due to his acute CVA -If there is no good change to recover well from stroke, I think he would not be a candidate for cancer treatment, I would likely discuss hospice with patient and his family. -I will f/u tomorrow    Truitt Merle, MD 01/18/2021  5:01 PM

## 2021-01-18 NOTE — Progress Notes (Signed)
PT Cancellation Note  Patient Details Name: Timothy Barton MRN: [id number] DOB: 1950-08-13   Cancelled Treatment:    Reason Eval/Treat Not Completed: Other (comment) (transferring to [facility]). Per chart review, pt transferring to [facility]. Will defer PT eval to next venue.    Talbot Grumbling PT, DPT 01/18/21, 9:23 AM

## 2021-01-18 NOTE — Consult Note (Signed)
Neurology Consultation  Reason for Consult: Stroke Referring Physician: Dr. Ileene Musa, [facility] hospitalist  CC: Altered mental status  History is obtained from: Chart review  HPI: Timothy Barton is a 70 y.o. 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  On sometime 01/16/2021 is when the patient was last known well, went to bed and woke up Wednesday morning and began to say things that did not make sense.  EMS was called who evaluated him and ruled out stroke.  He was endorsing that he was seeing split vision when he tries to [place] with both eyes.  He could not recognize his wife and would not follow commands consistently.  He was brought over to the cancer center where he was evaluated blood work obtained and IV fluids started and sent as a direct admission to the hospital for further work-up, which revealed a UTI and was presumed to be the cause of his altered mental status.  The admitting hospitalist was concerned for something more than a UTI leading to this presentation and noted a brain MRI that showed multifocal infarcts as described below.  Neurological consultation was obtained for that regard. Patient was unable to provide a reliable history   LKW: Sometime on 01/16/2021 when he went to bed tpa given?: no, outside the window Premorbid modified Rankin scale (mRS): Presumably pretty independent at baseline-0  ROS: Unable to reliably ascertain due to his aphasia  Past Medical History:  Diagnosis Date   A-fib [facility])    Arthritis    Hypertension    Osteoarthritis of right hip 07/05/2012   Sleep apnea     Family History  Problem Relation Age of Onset   Stroke Mother     Social History:   reports that  he quit smoking about 22 years ago. He has a 31.50 pack-year smoking history. He quit smokeless tobacco use about 38 years ago.  His smokeless tobacco use included chew. He reports that he does not drink alcohol and does not use drugs.  Medications  Current Facility-Administered Medications:     stroke: mapping our early stages of recovery book, , Does not apply, Once, Tu, Ching T, DO   0.9 %  sodium chloride infusion, , Intravenous, Continuous, Tu, Ching T, DO, Last Rate: 75 mL/hr at 01/18/21 0131, New Bag at 01/18/21 0131   cefTRIAXone (ROCEPHIN) 1 g in sodium chloride 0.9 % 100 mL IVPB, 1 g, Intravenous, Q24H, Tu, Ching T, DO, Last Rate: 200 mL/hr at 01/17/21 2133, 1 g at 01/17/21 2133   enoxaparin (LOVENOX) injection 40 mg, 40 mg, Subcutaneous, Q24H, Tu, Ching T, DO, 40 mg at 01/17/21 2128   Exam: Current vital signs: BP 123/64 (BP Location: Right Arm)   Pulse 88   Temp 98.7 F (37.1 C)   Resp 20   Ht '5\' 9"'$  (1.753 m)   Wt 124 kg   SpO2 94%   BMI 40.37 kg/m  Vital signs in last 24 hours: Temp:  [98.5 F (36.9 C)-99.2 F (37.3 C)] 98.7 F (37.1 C) (07/28 0133) Pulse Rate:  [86-95] 88 (07/28 0133) Resp:  [16-20] 20 (07/28 0133) BP: (100-123)/(59-84) 123/64 (07/28 0133) SpO2:  [94 %-97 %] 94 % (07/28 0133) Weight:  [124 kg-124.6 kg] 124 kg (07/27 1758) General: Awake alert in no distress HEENT: Normocephalic/atraumatic  Lungs clear Cardiovascular: Regular rate rhythm Abdomen nondistended nontender Extremities warm well perfused Neurological exam Awake alert oriented to self.  Was able to tell me his name.  On asking his age, he said he is 70 years old. Could not tell me the month.  Could not tell me where he is. His speech was fluent but word salad. Able to follow commands.  Unable to name.  Unable to repeat Cranial nerves: Pupils equal round reactive light, extra movements intact, visual fields-blinks to threat from both sides, face appears symmetric.  Tongue and palate  midline. Motor exam with no vertical drift in any fours Sensation intact to touch all over Coordination with no dysmetria NIH stroke scale 1a Level of Conscious.: 0 1b LOC Questions: 2 1c LOC Commands: 0 2 Best Gaze: 0 3 Visual: 0 4 Facial Palsy: 0 5a Motor Arm - left: 0 5b Motor Arm - Right: 0 6a Motor Leg - Left: 0 6b Motor Leg - Right: 0 7 Limb Ataxia: 0 8 Sensory: 0 9 Best Language: 2 10 Dysarthria: 0 11 Extinct. and Inatten.: 0 TOTAL: 4  Labs I have reviewed labs in epic and the results pertinent to this consultation are:  CBC    Component Value Date/Time   WBC 6.7 01/17/2021 1140   RBC 3.61 (L) 01/17/2021 1140   HGB 10.4 (L) 01/17/2021 1140   HGB 10.8 (L) 01/10/2021 1227   HCT 31.8 (L) 01/17/2021 1140   PLT 117 (L) 01/17/2021 1140   PLT 113 (L) 01/10/2021 1227   MCV 88.1 01/17/2021 1140   MCH 28.8 01/17/2021 1140   MCHC 32.7 01/17/2021 1140   RDW 14.6 01/17/2021 1140   LYMPHSABS 0.6 (L) 01/17/2021 1140   MONOABS 0.5 01/17/2021 1140   EOSABS 0.2 01/17/2021 1140   BASOSABS 0.0 01/17/2021 1140    CMP     Component Value Date/Time   NA 139 01/17/2021 1140   K 3.8 01/17/2021 1140   CL 103 01/17/2021 1140   CO2 26 01/17/2021 1140   GLUCOSE 105 (H) 01/17/2021 1140   BUN 49 (H) 01/17/2021 1140   CREATININE 1.68 (H) 01/17/2021 1140   CREATININE 1.15 01/10/2021 1227   CALCIUM 9.2 01/17/2021 1140   PROT 6.5 01/17/2021 1140   ALBUMIN 2.6 (L) 01/17/2021 1140   AST 21 01/17/2021 1140   AST 22 01/10/2021 1227   ALT 10 01/17/2021 1140   ALT 15 01/10/2021 1227   ALKPHOS 72 01/17/2021 1140   BILITOT 0.7 01/17/2021 1140   BILITOT 0.8 01/10/2021 1227   GFRNONAA 44 (L) 01/17/2021 1140   GFRNONAA >60 01/10/2021 1227   GFRAA >60 11/05/2014 0717  Ammonia level normal  Imaging I have reviewed the images obtained: MRI examination of the brain reveals acute to early subacute ischemic infarctions involving the left cerebral hemisphere with associated petechial  hemorrhage without frank intraparenchymal hematoma or mass-effect.  Few additional subcentimeter acute to early subacute ischemic infarctions involving the subcortical right frontal lobe and superior left cerebellum.  No acute intracranial abnormality.  No evidence for intracranial metastatic disease.  Underlying age-related cerebral atrophy and mild chronic small vessel ischemic disease.  Assessment: 70 year old with above past medical history brought in for evaluation of confusion and altered mental status along with garbled speech. On examination has expressive aphasia that is significant with not much more abnormal on exam. MRI reveals multifocal infarctions the largest one involving the left parieto-occipital area and temporal area without frank intraparenchymal Thoma but petechial hemorrhage.  Additional areas of  cystic diffusion and subcortical right frontal lobe and superior left cerebellum.  This pattern of multiple vascular territories is suspicious for strokes either due to cardioembolic source given he has atrial fibrillation and is not anticoagulated or hypercoagulability given the history of underlying liver cancer.  Recommendations: Hold aspirin for now given petechial hemorrhage in the stroke area. MRI angio head without contrast Carotid Dopplers A1c Lipid panel Frequent neurochecks Telemetry Eventually will need anticoagulation given history of A. fib and possible hypercoagulability from the cancer. PT OT speech therapy N.p.o. until cleared by bedside stroke swallow screen or formal swallow evaluation. Symptom has been ongoing for greater than 24 hours.  Allow permissive hypertension for another 24 to 48 hours by treating only if systolic blood pressures go above 220 and that 2 on a as needed basis. Eventual goal blood pressure less than 140/90. Stroke team will follow Please transfer patient to [facility] for stroke team follow-up   Plan discussed with Dr. Flossie Buffy,  admitting hospitalist   -- Amie Portland, MD Neurologist [facility] Pager: [phone]

## 2021-01-18 NOTE — Evaluation (Addendum)
Clinical/Bedside Swallow Evaluation Patient Details  Name: Timothy Barton MRN: [id number] Date of Birth: April 08, 1951  Today's Date: 01/18/2021 Time: SLP Start Time (ACUTE ONLY): 1430 SLP Stop Time (ACUTE ONLY): 1510 SLP Time Calculation (min) (ACUTE ONLY): 40 min  Past Medical History:  Past Medical History:  Diagnosis Date   A-fib ([place])    Arthritis    Hypertension    Osteoarthritis of right hip 07/05/2012   Sleep apnea    Past Surgical History:  Past Surgical History:  Procedure Laterality Date   arthrosopy     BACK SURGERY  2006    ruptured disc,and repair of leak x3   CARPAL TUNNEL RELEASE  2002   right and left   KNEE ARTHROSCOPY  2006   right   TOTAL HIP ARTHROPLASTY  07/03/2012   Procedure: TOTAL HIP ARTHROPLASTY;  Surgeon: Yvette Rack., MD;  Location: [facility];  Service: Orthopedics;  Laterality: Right;  with autograft   TOTAL HIP ARTHROPLASTY Left 11/04/2014   Procedure: TOTAL HIP ARTHROPLASTY;  Surgeon: Earlie Server, MD;  Location: [facility];  Service: Orthopedics;  Laterality: Left;   HPI:  Timothy Barton is a 70 y.o. male presenting to the emergency room for evaluation of altered mental status. MRI imaging shows patchy and confluent acute to early subacute ischemic infarcts left parietal, occipital, temporal lobes, Associated  petechial hemorrhage without frank intraparenchymal hematoma or  significant mass effect. and a Few additional subcentimeter acute to early subacute ischemic  infarcts involving the subcortical right frontal lobe and superior  left cerebellum. 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  Mild facial asymmetry noted otherwise no  focal CN deficits and voice and cough are strong.   Swallow appeared timely judged by laryngeal elevation observation.   He did not pass [facility] due to cough within 5 seconds of 3 ounce water swallow. Small single boluses tolerated without indication of aspiration.  Adequate mastication with pt taking small bites observed.  Recommend dys3/thin due to pt's respiratory status, avoiding straws and taking medications with puree.  SLP will follow up for po tolerance, indication for instrumental swallow evaluation and SLE.   Pt has expressive aphasia - but understanding during swallow evaluation was intact when assuring pt could hear speaker - he is Timothy Barton and right ear is better.  Did preliminary education of wife/daughter re: pt's language deficits and compensation strategies.    SLP informed pt, his wife, and his daughter to diet recommendations including clinical reasoning for precautions, compensations and modifications.  All in agreement to plan.   SLP Visit Diagnosis: Dysphagia, unspecified (R13.10)    Aspiration Risk  Mild aspiration risk    Diet Recommendation Dysphagia 3 (Mech soft);Thin liquid   Liquid Administration via: Cup;No straw;Spoon Medication Administration: Whole meds with puree Supervision: Patient able to self feed;Intermittent supervision to cue for compensatory strategies Compensations: Slow rate;Small sips/bites Postural Changes: Seated upright at 90 degrees;Remain upright for at least 30 minutes after po intake    Other  Recommendations Oral Care Recommendations: Oral care BID   Follow up Recommendations Inpatient Rehab      Frequency and Duration min 1 x/week  2 weeks       Prognosis Prognosis for Safe Diet Advancement: Fair  Swallow Study   General Date of Onset: 01/18/21 HPI: Timothy Barton is a 70 y.o. male presenting to the emergency room for evaluation of altered mental status. MRI imaging shows patchy and confluent acute to early subacute ischemic  infarcts  involving the left cerebral hemisphere as above. Associated  petechial hemorrhage without frank intraparenchymal hematoma or  significant mass effect. and a Few additional subcentimeter acute to early subacute ischemic  infarcts involving the subcortical right frontal lobe and superior  left cerebellum. 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, hypertension, obstructive sleep apnea, obesity, atrial fibrillation, hypertension, sleep apnea Previous Swallow Assessment: none Diet Prior to this Study: NPO Temperature Spikes Noted: No Respiratory Status: Room air History of Recent Intubation: No Behavior/Cognition: Alert;Cooperative;Pleasant mood Oral Cavity Assessment: Within Functional Limits (posterior oral cavity 0 minimal yellow tinge of anterior faucial pillars - suspect due to liver cancer) Oral Care Completed by SLP: No Oral Cavity - Dentition: Adequate natural dentition Vision: Functional for self-feeding Self-Feeding Abilities: Able to feed self Patient Positioning: Upright in bed Baseline Vocal Quality: Normal Volitional Cough: Strong    Oral/Motor/Sensory Function Overall Oral Motor/Sensory Function:  (minimal  facial asymmetry)   Ice Chips Ice chips: Within functional limits Presentation: Spoon   Thin Liquid Thin Liquid: Impaired Presentation: Cup;Self Fed;Spoon;Straw Other Comments: Pt did not pass the [place] swallow screen as he presented with cough within 5 seconds post-swallow,   Pt consumption of small single cup sips did not elicit cough response.  Swallow appeared timely with strong voice post-swallow.    Nectar Thick Nectar Thick Liquid: Not tested   Honey Thick Honey Thick Liquid: Not tested   Puree Puree: Within functional limits Presentation: Self Fed;Spoon   Solid     Solid: Within functional limits Presentation: Timothy Barton, Timothy Szczepanik  Barton 01/18/2021,4:02 PM. Timothy Lime, MS [facility] SLP Industry Office [phone] Pager [phone]

## 2021-01-19 ENCOUNTER — Inpatient Hospital Stay (HOSPITAL_COMMUNITY): Payer: Medicare Other

## 2021-01-19 ENCOUNTER — Ambulatory Visit (HOSPITAL_COMMUNITY): Admission: RE | Admit: 2021-01-19 | Payer: Medicare Other | Source: Ambulatory Visit

## 2021-01-19 DIAGNOSIS — R1010 Upper abdominal pain, unspecified: Secondary | ICD-10-CM

## 2021-01-19 DIAGNOSIS — I639 Cerebral infarction, unspecified: Secondary | ICD-10-CM | POA: Diagnosis not present

## 2021-01-19 DIAGNOSIS — I6389 Other cerebral infarction: Secondary | ICD-10-CM | POA: Diagnosis not present

## 2021-01-19 DIAGNOSIS — R400 Somnolence: Secondary | ICD-10-CM

## 2021-01-19 LAB — BASIC METABOLIC PANEL
Anion gap: 10 (ref 5–15)
BUN: 43 mg/dL — ABNORMAL HIGH (ref 8–23)
CO2: 24 mmol/L (ref 22–32)
Calcium: 8.7 mg/dL — ABNORMAL LOW (ref 8.9–10.3)
Chloride: 103 mmol/L (ref 98–111)
Creatinine, Ser: 1.25 mg/dL — ABNORMAL HIGH (ref 0.61–1.24)
GFR, Estimated: >60 mL/min (ref >60)
Glucose, Bld: 110 mg/dL — ABNORMAL HIGH (ref 70–99)
Potassium: 3.6 mmol/L (ref 3.5–5.1)
Sodium: 137 mmol/L (ref 135–145)

## 2021-01-19 LAB — PROTIME-INR
INR: 1.2 (ref 0.8–1.2)
Prothrombin Time: 14.7 seconds (ref 11.4–15.2)

## 2021-01-19 LAB — HEMOGLOBIN A1C
Hgb A1c MFr Bld: 5.8 % — ABNORMAL HIGH (ref 4.8–5.6)
Mean Plasma Glucose: 120 mg/dL

## 2021-01-19 LAB — ECHOCARDIOGRAM COMPLETE
Area-P 1/2: 3.02 cm2
Height: 69 in
S' Lateral: 3.4 cm
Weight: 4433.89 oz

## 2021-01-19 LAB — URINE CULTURE: Culture: >=100000 — AB

## 2021-01-19 MED ORDER — FENTANYL CITRATE (PF) 100 MCG/2ML IJ SOLN
INTRAMUSCULAR | Status: AC
  Administered 2021-01-19 (×2): 50 ug
  Filled 2021-01-19: qty 2

## 2021-01-19 MED ORDER — SENNOSIDES-DOCUSATE SODIUM 8.6-50 MG PO TABS
1.0000 | ORAL_TABLET | Freq: Two times a day (BID) | ORAL | Status: DC
  Administered 2021-01-19 – 2021-01-20 (×2): 1 via ORAL
  Filled 2021-01-19 (×2): qty 1

## 2021-01-19 MED ORDER — ASPIRIN EC 325 MG PO TBEC
325.0000 mg | DELAYED_RELEASE_TABLET | Freq: Every day | ORAL | Status: DC
  Administered 2021-01-20: 325 mg via ORAL
  Filled 2021-01-19: qty 1

## 2021-01-19 MED ORDER — ATORVASTATIN CALCIUM 20 MG PO TABS
20.0000 mg | ORAL_TABLET | Freq: Every day | ORAL | Status: DC
  Administered 2021-01-19 – 2021-01-20 (×2): 20 mg via ORAL
  Filled 2021-01-19 (×2): qty 1

## 2021-01-19 MED ORDER — POLYETHYLENE GLYCOL 3350 17 G PO PACK: 17.0000 g | PACK | Freq: Every day | ORAL | Status: DC | PRN

## 2021-01-19 NOTE — Progress Notes (Addendum)
Timothy Barton   DOB:1951/05/22   [id number]   [id number]  Oncology follow up   Subjective: The patient continues to have confusion.  His wife is at the bedside and does report that he has had some improvement.  For example, this morning he knew that she was his wife and was able to say her name.  No other complaints reported today.   Objective:  Vitals:   01/19/21 0620 01/19/21 0925  BP: 138/73 126/75  Pulse: 88 93  Resp: 16 20  Temp: 98.7 F (37.1 C) 98.7 F (37.1 C)  SpO2: 92% 94%    Body mass index is 40.92 kg/m.  Intake/Output Summary (Last 24 hours) at 01/19/2021 0958 Last data filed at 01/19/2021 [id number] Gross per 24 hour  Intake 1543.62 ml  Output 650 ml  Net 893.62 ml    Alert, able to identify his wife, but does not know where he is or what year it is  Sclerae unicteric  Oropharynx clear  No peripheral adenopathy  Lungs clear -- no rales or rhonchi  Heart regular rate and rhythm  Abdomen distended   CBG (last 3)  No results for input(s): GLUCAP in the last 72 hours.   Labs:   Urine Studies No results for input(s): UHGB, CRYS in the last 72 hours.  Invalid input(s): UACOL, UAPR, USPG, UPH, UTP, UGL, UKET, UBIL, UNIT, UROB, ULEU, UEPI, UWBC, URBC, UBAC, CAST, [place], [place]  Basic Metabolic Panel: Recent Labs  Lab 01/17/21 1140 01/18/21 0402 01/19/21 0712  NA 139 139 137  K 3.8 3.6 3.6  CL 103 105 103  CO2 '26 26 24  '$ GLUCOSE 105* 113* 110*  BUN 49* 48* 43*  CREATININE 1.68* 1.60* 1.25*  CALCIUM 9.2 8.6* 8.7*   GFR Estimated Creatinine Clearance: 73.1 mL/min (A) (by C-G formula based on SCr of 1.25 mg/dL (H)). Liver Function Tests: Recent Labs  Lab 01/17/21 1140  AST 21  ALT 10  ALKPHOS 72  BILITOT 0.7  PROT 6.5  ALBUMIN 2.6*   No results for input(s): LIPASE, AMYLASE in the last 168 hours. Recent Labs  Lab 01/17/21 1139  AMMONIA 24   Coagulation profile No results for input(s): INR, PROTIME in the last 168 hours.  CBC: Recent  Labs  Lab 01/17/21 1140  WBC 6.7  NEUTROABS 5.3  HGB 10.4*  HCT 31.8*  MCV 88.1  PLT 117*   Cardiac Enzymes: No results for input(s): CKTOTAL, CKMB, CKMBINDEX, TROPONINI in the last 168 hours. BNP: Invalid input(s): POCBNP CBG: No results for input(s): GLUCAP in the last 168 hours. D-Dimer No results for input(s): DDIMER in the last 72 hours. Hgb A1c Recent Labs    01/18/21 0402  HGBA1C 5.8*   Lipid Profile Recent Labs    01/18/21 0402  CHOL 115  HDL 25*  LDLCALC 73  TRIG 84  CHOLHDL 4.6   Thyroid function studies Recent Labs    01/17/21 1139  TSH 1.067   Anemia work up No results for input(s): VITAMINB12, FOLATE, FERRITIN, TIBC, IRON, RETICCTPCT in the last 72 hours. Microbiology Recent Results (from the past 240 hour(s))  Urine Culture     Status: Abnormal   Collection Time: 01/17/21  1:30 PM   Specimen: Urine, Clean Catch  Result Value Ref Range Status   Specimen Description   Final    URINE, CLEAN CATCH Performed at [facility], [address], [place], [place] [address]    Special Requests   Final  NONE Performed at [facility], [place] [address], [place], [place] [address]    Culture >=100,000 COLONIES/mL ESCHERICHIA COLI (A)  Final   Report Status 01/19/2021 FINAL  Final   Organism ID, Bacteria ESCHERICHIA COLI (A)  Final      Susceptibility   Escherichia coli - MIC*    AMPICILLIN 8 SENSITIVE Sensitive     CEFAZOLIN <=4 SENSITIVE Sensitive     CEFEPIME <=0.12 SENSITIVE Sensitive     CEFTRIAXONE <=0.25 SENSITIVE Sensitive     CIPROFLOXACIN <=0.25 SENSITIVE Sensitive     GENTAMICIN <=1 SENSITIVE Sensitive     IMIPENEM <=0.25 SENSITIVE Sensitive     NITROFURANTOIN <=16 SENSITIVE Sensitive     TRIMETH/SULFA <=20 SENSITIVE Sensitive     AMPICILLIN/SULBACTAM 4 SENSITIVE Sensitive     PIP/TAZO <=4 SENSITIVE Sensitive     * >=100,000 COLONIES/mL ESCHERICHIA COLI  SARS CORONAVIRUS 2 (TAT 6-24  HRS) Nasopharyngeal Nasopharyngeal Swab     Status: None   Collection Time: 01/17/21  8:24 PM   Specimen: Nasopharyngeal Swab  Result Value Ref Range Status   SARS Coronavirus 2 NEGATIVE NEGATIVE Final    Comment: (NOTE) SARS-CoV-2 target nucleic acids are NOT DETECTED.  The SARS-CoV-2 RNA is generally detectable in upper and lower respiratory specimens during the acute phase of infection. Negative results do not preclude SARS-CoV-2 infection, do not rule out co-infections with other pathogens, and should not be used as the sole basis for treatment or other patient management decisions. Negative results must be combined with clinical observations, patient history, and epidemiological information. The expected result is Negative.  Fact Sheet for Patients: SugarRoll.be  Fact Sheet for Healthcare Providers: https://www.woods-mathews.com/  This test is not yet approved or cleared by the [place] FDA and  has been authorized for detection and/or diagnosis of SARS-CoV-2 by FDA under an Emergency Use Authorization (EUA). This EUA will remain  in effect (meaning this test can be used) for the duration of the COVID-19 declaration under Se ction 564(b)(1) of the Act, 21 U.S.C. section 360bbb-3(b)(1), unless the authorization is terminated or revoked sooner.  Performed at [facility], [place] [address], [place], [place] [address]   Urine Culture     Status: Abnormal (Preliminary result)   Collection Time: 01/17/21  8:24 PM   Specimen: Urine, Clean Catch  Result Value Ref Range Status   Specimen Description   Final    URINE, CLEAN CATCH Performed at [facility], [place] [address] [place], [place] [address]    Special Requests   Final    NONE Performed at [facility], [place] [address], [place], [place] [address]    Culture >=100,000 COLONIES/mL ESCHERICHIA COLI (A)  Final   Report Status PENDING   Incomplete      Studies:  DG Chest 2 View  Result Date: 01/17/2021 CLINICAL DATA:  acute mental status change, cancer, r/o infection EXAM: CHEST - 2 VIEW COMPARISON:  12/13/2020 FINDINGS: Cardiomediastinal contours are within normal limits. Atherosclerotic calcification of the aortic knob. Small bilateral pleural effusions with associated hazy bibasilar opacities. No pneumothorax. IMPRESSION: Small bilateral pleural effusions with associated hazy bibasilar opacities which may represent atelectasis versus infection. Electronically Signed   By: Davina Poke D.O.   On: 01/17/2021 14:57   MR ANGIO HEAD WO CONTRAST  Result Date: 01/18/2021 CLINICAL DATA:  Stroke, follow-up EXAM: MRA HEAD WITHOUT CONTRAST TECHNIQUE: Angiographic images of the Circle of Willis were acquired using MRA technique without intravenous  contrast. COMPARISON:  No pertinent prior exam. FINDINGS: Intracranial internal carotid arteries are patent. Middle and anterior cerebral arteries are patent. Intracranial vertebral arteries, basilar artery, posterior cerebral arteries are patent. Bilateral posterior communicating arteries are present. There is no significant stenosis or aneurysm. IMPRESSION: No proximal intracranial vessel occlusion or significant stenosis. Electronically Signed   By: Macy Mis M.D.   On: 01/18/2021 08:07   MR BRAIN W WO CONTRAST  Result Date: 01/17/2021 CLINICAL DATA:  Initial evaluation for altered mental status, unknown cause. EXAM: MRI HEAD WITHOUT AND WITH CONTRAST TECHNIQUE: Multiplanar, multiecho pulse sequences of the brain and surrounding structures were obtained without and with intravenous contrast. CONTRAST:  60m GADAVIST GADOBUTROL 1 MMOL/ML IV SOLN COMPARISON:  CT from 11/25/2020. FINDINGS: Brain: Generalized age-related cerebral atrophy. Scattered patchy T2/FLAIR hyperintensity within the periventricular deep white matter both cerebral hemispheres most consistent with chronic small vessel  ischemic disease, mild in nature. Small remote right cerebellar infarct noted. Patchy and confluent restricted diffusion involving the cortical and subcortical aspects of the left frontal, parietal, occipital, and temporal lobes, consistent with acute to early subacute ischemic infarcts. Largest area of involvement present at the left temporal lobe and measures up to 6 cm in diameter. Associated petechial hemorrhage without frank intraparenchymal hematoma or significant mass effect. Few additional subcentimeter acute to early subacute ischemic infarcts noted at the subcortical right frontal lobe (series 5, image 85) and superior left cerebellum (series 5, image 62). Gray-white matter differentiation otherwise maintained. No other evidence for acute or chronic intracranial hemorrhage. No mass lesion, midline shift, or significant mass effect. No hydrocephalus or extra-axial fluid collection. Pituitary gland suprasellar region within normal limits. No abnormal enhancement or evidence for intracranial metastatic disease. Vascular: Major intracranial vascular flow voids are maintained. Skull and upper cervical spine: Craniocervical junction within normal limits. Multilevel degenerative spondylosis noted within the upper cervical spine with no more than mild spinal stenosis at C3-4. Bone marrow signal intensity heterogeneous without focal marrow replacing lesion. No scalp soft tissue abnormality. Sinuses/Orbits: Globes and orbital soft tissues within normal limits. Paranasal sinuses are largely clear. No significant mastoid effusion. Inner ear structures grossly normal. Other: None. IMPRESSION: 1. Patchy and confluent acute to early subacute ischemic infarcts involving the left cerebral hemisphere as above. Associated petechial hemorrhage without frank intraparenchymal hematoma or significant mass effect. 2. Few additional subcentimeter acute to early subacute ischemic infarcts involving the subcortical right frontal  lobe and superior left cerebellum. 3. No other acute intracranial abnormality. No evidence for intracranial metastatic disease. 4. Underlying age-related cerebral atrophy with mild chronic small vessel ischemic disease. Electronically Signed   By: BJeannine BogaM.D.   On: 01/17/2021 21:18   [facility] [place] [facility]  Result Date: 01/18/2021 Carotid Arterial Duplex Study Patient Name:  Timothy Barton Date of Exam:   01/18/2021 Medical Rec #: [id number]        Accession #:    [id number] of Birth: 11/26/1950-06-16        Patient Gender: M Patient Age:   70Y [facility]      VAS [place] Referring Phys: [id number] TU --------------------------------------------------------------------------------  Indications:       CVA. Risk Factors:      Hypertension. Comparison Study:  no prior Performing Technologist: MArchie PattenRVS  Examination Guidelines: A complete evaluation includes B-mode imaging, spectral Doppler, color Doppler, and power Doppler as needed of all accessible portions of  each vessel. Bilateral testing is considered an integral part of a complete examination. Limited examinations for reoccurring indications may be performed as noted.  Right Carotid Findings: +----------+--------+--------+--------+-------------------------+---------+           PSV cm/sEDV cm/sStenosisPlaque Description       Comments  +----------+--------+--------+--------+-------------------------+---------+ CCA Prox  112     16              heterogenous                       +----------+--------+--------+--------+-------------------------+---------+ CCA Distal93      24              heterogenous and calcificShadowing +----------+--------+--------+--------+-------------------------+---------+ ICA Prox  87      22      1-39%   heterogenous                       +----------+--------+--------+--------+-------------------------+---------+ ICA Distal105      24                                                 +----------+--------+--------+--------+-------------------------+---------+ ECA       119     14                                                 +----------+--------+--------+--------+-------------------------+---------+ +----------+--------+-------+--------+-------------------+           PSV cm/sEDV cmsDescribeArm Pressure (mmHG) +----------+--------+-------+--------+-------------------+ [id number]                                         +----------+--------+-------+--------+-------------------+ +---------+--------+--+--------+--+---------+ VertebralPSV cm/s51EDV cm/s10Antegrade +---------+--------+--+--------+--+---------+  Left Carotid Findings: +----------+--------+--------+--------+------------------+--------+           PSV cm/sEDV cm/sStenosisPlaque DescriptionComments +----------+--------+--------+--------+------------------+--------+ CCA Prox  93      17              heterogenous               +----------+--------+--------+--------+------------------+--------+ CCA Distal83      18              heterogenous               +----------+--------+--------+--------+------------------+--------+ ICA Prox  85      30      1-39%   heterogenous               +----------+--------+--------+--------+------------------+--------+ ICA Distal79      24                                         +----------+--------+--------+--------+------------------+--------+ ECA       101     9                                          +----------+--------+--------+--------+------------------+--------+ +----------+--------+--------+--------+-------------------+           PSV cm/sEDV cm/sDescribeArm Pressure (mmHG) +----------+--------+--------+--------+-------------------+ [id number]                                          +----------+--------+--------+--------+-------------------+  +---------+--------+--+--------+-+---------+  VertebralPSV cm/s36EDV cm/s9Antegrade +---------+--------+--+--------+-+---------+   Summary: Right Carotid: Velocities in the right ICA are consistent with a 1-39% stenosis. Left Carotid: Velocities in the left ICA are consistent with a 1-39% stenosis. Vertebrals: Bilateral vertebral arteries demonstrate antegrade flow. *See table(s) above for measurements and observations.     Preliminary     Assessment: 70 y.o. 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  He is tolerating some liquids this morning. -He will be transferred to [facility] for neurology follow-up and work-up.  Further discussion per neurology regarding prognosis from CVA. -Due to his diffuse metastatic malignancy, I recommended to avoid invasive work-up for stroke, given his overall guarded prognosis from cancer standpoint. -His stroke could be related to his underlying malignancy, due to hypercoagulopathy.  If neurology thinks there is a value for anticoagulation, I think that is reasonable, but would likely hold it for now due to his small hemorrhage. -He was scheduled for biopsy today and this has been canceled.  We will consider getting a biopsy in the near future once cleared from a neurology standpoint. -If there is not a good chance for recovery from his stroke, I think he would not be a candidate for cancer treatment, I would likely discuss hospice with patient and his family.  Mikey Bussing, NP 01/19/2021  9:58 AM  Addendum  Patient's mental status has improved, was able to recognize his wife and me, he seems to be able to understand the conversation more, and his expressive aphasia has also improved.  No other neurological new  deficits.  He feels hungry, he was going down for barium swallow evaluation for aspiration by speech therapist when I saw him around noon.  I spoke with neurologist Dr. Erlinda Hong today.  He has reviewed all the available test results, and feels that the patient does not need to be transferred to [facility], since they do not plan to do more additional testing.  Dr. Erlinda Hong feels pt had moderate stroke, his aphasia will likely improve, but it is hard to predict how much he can improve.  He recommend aspirin, and at some point starting anticoagulation, due to the likelihood of his stroke is embolic stroke from the hypercoagulopathy from his malignancy.  I will check PT/INR today, due to his liver cirrhosis, to see if he is a candidate for anticoagulation.  I also discussed with interventional radiologist Dr. Pascal Lux about tissue biopsy to confirm his malignancy, and the type of malignancy.  Dr. Was reviewed his image, and feels he cannot do the right paraspinal soft tissues mass biopsy without IV sedation.  I spoke with patient's wife, she agrees to proceed.  This will give [place] more information about his prognosis and treatment options, although patient may not be a candidate for chemotherapy if he does not recover well from stroke.  From oncology standpoint, I do not have additional work-up planned.  I will defer to neurology regarding his needs for rehab and disposition. I will be available by phone over the weekend, if you have any questions about his disposition.   Truitt Merle  01/19/2021

## 2021-01-19 NOTE — Progress Notes (Signed)
  Speech Language Pathology Treatment: Dysphagia  Patient Details Name: Timothy Barton MRN: [id number] DOB: [birth year] Today's Date: 01/19/2021 Time: [id number] SLP Time Calculation (min) (ACUTE ONLY): 16 min  Assessment / Plan / Recommendation Clinical Impression  Follow up regarding pt's swallow function indicated - Per wife, pt consumed jello and Icee today.  He has not been consuming solids since PTA due to his abdomen edema.   He is lying in bed, wanting water and needs moderate verbal/tactile cues to sit upright for po intake. SLP observed pt consuming water and ice, intermttent cough noted with intake of liquids.  Family reports this to be occuring today with increased frequency compared to yesterday.     Given pt's acute CVA and increased frequency of coughing with liquids - will proceed with MBS.  Pt, wife and daughter agreeable to plan. Dr Annamaria Boots was present when SLP informed family/pt of care plan and that xray was on their way within minutes to take pt for testing.    HPI HPI: Timothy Barton is a 70 y.o. male presenting to the emergency room for evaluation of altered mental status. MRI imaging shows patchy and confluent acute to early subacute ischemic infarcts  involving the left cerebral hemisphere as above. Associated  petechial hemorrhage without frank intraparenchymal hematoma or  significant mass effect. and a Few additional subcentimeter acute to early subacute ischemic  infarcts involving the subcortical right frontal lobe and superior  left cerebellum. 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, hypertension, obstructive sleep apnea, obesity, atrial fibrillation, hypertension, sleep apnea      SLP Plan  MBS       Recommendations  Diet recommendations: Dysphagia 3 (mechanical soft);Thin liquid Liquids provided via: Cup;No straw Medication Administration: Whole meds with  puree Compensations: Slow rate;Small sips/bites Postural Changes and/or Swallow Maneuvers: Seated upright 90 degrees;Upright 30-60 min after meal                Oral Care Recommendations: Oral care BID Follow up Recommendations: Home health SLP;Outpatient SLP SLP Visit Diagnosis: Dysphagia, unspecified (R13.10) Plan: MBS       GO               Timothy Lime, MS [facility] SLP Acute Rehab Services Office [phone] Pager [phone]    Timothy Barton 01/19/2021, 2:10 PM

## 2021-01-19 NOTE — Progress Notes (Addendum)
STROKE TEAM PROGRESS NOTE   SUBJECTIVE (INTERVAL HISTORY) His RN is at the bedside.  Overall his condition is stable. Pt went for left paraspinal mass biopsy earlier today. Currently sleepy. LE venous doppler not done yet. TTE and INR unremarkable. Put on lipitor. Tried to give wife an update but she did not pick up the phone.   OBJECTIVE Temp:  [98.4 F (36.9 C)-99 F (37.2 C)] 98.7 F (37.1 C) (07/29 1700) Pulse Rate:  [88-104] 98 (07/29 1700) Cardiac Rhythm: Normal sinus rhythm (07/29 1446) Resp:  [16-23] 23 (07/29 1700) BP: (119-144)/(68-78) 141/78 (07/29 1700) SpO2:  [92 %-97 %] 93 % (07/29 1700)  No results for input(s): GLUCAP in the last 168 hours. Recent Labs  Lab 01/17/21 1140 01/18/21 0402 01/19/21 0712  NA 139 139 137  K 3.8 3.6 3.6  CL 103 105 103  CO2 '26 26 24  '$ GLUCOSE 105* 113* 110*  BUN 49* 48* 43*  CREATININE 1.68* 1.60* 1.25*  CALCIUM 9.2 8.6* 8.7*   Recent Labs  Lab 01/17/21 1140  AST 21  ALT 10  ALKPHOS 72  BILITOT 0.7  PROT 6.5  ALBUMIN 2.6*   Recent Labs  Lab 01/17/21 1140  WBC 6.7  NEUTROABS 5.3  HGB 10.4*  HCT 31.8*  MCV 88.1  PLT 117*   No results for input(s): CKTOTAL, CKMB, CKMBINDEX, TROPONINI in the last 168 hours. Recent Labs    01/19/21 1100  LABPROT 14.7  INR 1.2   Recent Labs    01/17/21 1330  COLORURINE YELLOW  LABSPEC 1.016  PHURINE 5.0  GLUCOSEU NEGATIVE  HGBUR SMALL*  BILIRUBINUR NEGATIVE  KETONESUR NEGATIVE  PROTEINUR NEGATIVE  30*  NITRITE POSITIVE*  LEUKOCYTESUR MODERATE*       Component Value Date/Time   CHOL 115 01/18/2021 0402   TRIG 84 01/18/2021 0402   HDL 25 (L) 01/18/2021 0402   CHOLHDL 4.6 01/18/2021 0402   VLDL 17 01/18/2021 0402   LDLCALC 73 01/18/2021 0402   Lab Results  Component Value Date   HGBA1C 5.8 (H) 01/18/2021   No results found for: LABOPIA, COCAINSCRNUR, LABBENZ, AMPHETMU, THCU, LABBARB  No results for input(s): ETH in the last 168 hours.  I have personally reviewed  the radiological images below and agree with the radiology interpretations.  DG Chest 2 View  Result Date: 01/17/2021 CLINICAL DATA:  acute mental status change, cancer, r/o infection EXAM: CHEST - 2 VIEW COMPARISON:  12/13/2020 FINDINGS: Cardiomediastinal contours are within normal limits. Atherosclerotic calcification of the aortic knob. Small bilateral pleural effusions with associated hazy bibasilar opacities. No pneumothorax. IMPRESSION: Small bilateral pleural effusions with associated hazy bibasilar opacities which may represent atelectasis versus infection. Electronically Signed   By: Davina Poke D.O.   On: 01/17/2021 14:57   MR ANGIO HEAD WO CONTRAST  Result Date: 01/18/2021 CLINICAL DATA:  Stroke, follow-up EXAM: MRA HEAD WITHOUT CONTRAST TECHNIQUE: Angiographic images of the Circle of Willis were acquired using MRA technique without intravenous contrast. COMPARISON:  No pertinent prior exam. FINDINGS: Intracranial internal carotid arteries are patent. Middle and anterior cerebral arteries are patent. Intracranial vertebral arteries, basilar artery, posterior cerebral arteries are patent. Bilateral posterior communicating arteries are present. There is no significant stenosis or aneurysm. IMPRESSION: No proximal intracranial vessel occlusion or significant stenosis. Electronically Signed   By: Macy Mis M.D.   On: 01/18/2021 08:07   MR BRAIN W WO CONTRAST  Result Date: 01/17/2021 CLINICAL DATA:  Initial evaluation for altered mental status, unknown cause. EXAM: MRI  HEAD WITHOUT AND WITH CONTRAST TECHNIQUE: Multiplanar, multiecho pulse sequences of the brain and surrounding structures were obtained without and with intravenous contrast. CONTRAST:  91m GADAVIST GADOBUTROL 1 MMOL/ML IV SOLN COMPARISON:  CT from 11/25/2020. FINDINGS: Brain: Generalized age-related cerebral atrophy. Scattered patchy T2/FLAIR hyperintensity within the periventricular deep white matter both cerebral  hemispheres most consistent with chronic small vessel ischemic disease, mild in nature. Small remote right cerebellar infarct noted. Patchy and confluent restricted diffusion involving the cortical and subcortical aspects of the left frontal, parietal, occipital, and temporal lobes, consistent with acute to early subacute ischemic infarcts. Largest area of involvement present at the left temporal lobe and measures up to 6 cm in diameter. Associated petechial hemorrhage without frank intraparenchymal hematoma or significant mass effect. Few additional subcentimeter acute to early subacute ischemic infarcts noted at the subcortical right frontal lobe (series 5, image 85) and superior left cerebellum (series 5, image 62). Gray-white matter differentiation otherwise maintained. No other evidence for acute or chronic intracranial hemorrhage. No mass lesion, midline shift, or significant mass effect. No hydrocephalus or extra-axial fluid collection. Pituitary gland suprasellar region within normal limits. No abnormal enhancement or evidence for intracranial metastatic disease. Vascular: Major intracranial vascular flow voids are maintained. Skull and upper cervical spine: Craniocervical junction within normal limits. Multilevel degenerative spondylosis noted within the upper cervical spine with no more than mild spinal stenosis at C3-4. Bone marrow signal intensity heterogeneous without focal marrow replacing lesion. No scalp soft tissue abnormality. Sinuses/Orbits: Globes and orbital soft tissues within normal limits. Paranasal sinuses are largely clear. No significant mastoid effusion. Inner ear structures grossly normal. Other: None. IMPRESSION: 1. Patchy and confluent acute to early subacute ischemic infarcts involving the left cerebral hemisphere as above. Associated petechial hemorrhage without frank intraparenchymal hematoma or significant mass effect. 2. Few additional subcentimeter acute to early subacute  ischemic infarcts involving the subcortical right frontal lobe and superior left cerebellum. 3. No other acute intracranial abnormality. No evidence for intracranial metastatic disease. 4. Underlying age-related cerebral atrophy with mild chronic small vessel ischemic disease. Electronically Signed   By: BJeannine BogaM.D.   On: 01/17/2021 21:18   UKoreaAbdomen Limited  Result Date: 01/10/2021 CLINICAL DATA:  Liver lesions, cirrhosis EXAM: LIMITED ABDOMEN ULTRASOUND FOR ASCITES TECHNIQUE: Limited ultrasound survey for ascites was performed in all four abdominal quadrants. COMPARISON:  MR 12/29/2020 FINDINGS: Scattered small-volume abdominal ascites as before. Right lobe liver mass as described on previous MR. IMPRESSION: Small volume abdominal ascites Electronically Signed   By: DLucrezia EuropeM.D.   On: 01/10/2021 15:03   DG Abd Portable 1V  Result Date: 01/19/2021 CLINICAL DATA:  Abdominal pain EXAM: PORTABLE ABDOMEN - 1 VIEW COMPARISON:  MR abdomen 12/29/2020 FINDINGS: Nonspecific bowel gas pattern. Air-filled loops of large and small bowel are identified within the abdomen. In the left lower quadrant there are a few prominent air-filled loops of small bowel measuring up to 2.7 cm. IMPRESSION: 1. No signs of high-grade bowel obstruction. 2. Mildly prominent air-filled loops of small bowel in the left lower quadrant which may reflect focal ileus. Electronically Signed   By: TKerby MoorsM.D.   On: 01/19/2021 12:57   DG Swallowing Func-Speech Pathology  Result Date: 01/19/2021 Formatting of this result is different from the original. Objective Swallowing Evaluation: Type of Study: MBS-Modified Barium Swallow Study  Patient Details Name: DMORRY BAZILMRN: [id number] of Birth: 08/29/1950-07-31Today's Date: 01/19/2021 Time: SLP Start Time (ACUTE ONLY): [id number] Stop Time (ACUTE  ONLY): 1335 SLP Time Calculation (min) (ACUTE ONLY): 30 min Past Medical History: Past Medical History: Diagnosis Date  A-fib  ([place])   Arthritis   Hypertension   Osteoarthritis of right hip 07/05/2012  Sleep apnea  Past Surgical History: Past Surgical History: Procedure Laterality Date  arthrosopy    BACK SURGERY  2006   ruptured disc,and repair of leak x3  CARPAL TUNNEL RELEASE  2002  right and left  KNEE ARTHROSCOPY  2006  right  TOTAL HIP ARTHROPLASTY  07/03/2012  Procedure: TOTAL HIP ARTHROPLASTY;  Surgeon: Yvette Rack., MD;  Location: [facility];  Service: Orthopedics;  Laterality: Right;  with autograft  TOTAL HIP ARTHROPLASTY Left 11/04/2014  Procedure: TOTAL HIP ARTHROPLASTY;  Surgeon: Earlie Server, MD;  Location: [facility];  Service: Orthopedics;  Laterality: Left; HPI: MIR AMBER is a 70 y.o. male presenting to the emergency room for evaluation of altered mental status. MRI imaging shows patchy and confluent acute to early subacute ischemic infarcts  involving the left cerebral hemisphere as above. Associated  petechial hemorrhage without frank intraparenchymal hematoma or  significant mass effect. and a Few additional subcentimeter acute to early subacute ischemic  infarcts involving the subcortical right frontal lobe and superior  left cerebellum. 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  Subjective: pt awake in flouro chair Assessment / Plan / Recommendation CHL IP CLINICAL IMPRESSIONS 01/19/2021 Clinical Impression Patient presents with mild oropharyngeal dysphagia without aspiration of any consistency tested.  Mild decreased oral coordination results in occasional premature spillage, oral residual in posterior buccal region that patient transits and swallows.  Pt extended chin upward to aid oral transiting of barium tablet into pharynx but given this is precarious, recommend medicine with puree continue.  Pharyngeal swallow marked by  decreased adequacy of laryngeal elevation/closure which allowed penetration of thin liquid *on 3 swallows of approximately 10.  In addition, swallow triggered at pyriform sinus x1 allowing laryngeal penetration.  Chin tuck posture did not prevent laryngeal penetration and actually increased its frequency.    When taking tablet, patient's mild laryngeal penetration (before the swallow as liquid spilled into larynx before swallow triggered) did elicit a cough response - but aspiration was not observed fortunately. Mild amount of secretions noted in pharynx as well and appearance of osteophytes present that did not impair epiglottic deflection or barium transiting.  Pharyngeal swallow is strong without any residuals fortunately.  Please note patient was sitting fully upright for all po and given his decreased laryngeal closure- recommend he maintain this posture with all po intake.  Recommend continue current diet with precautions. Pt informed to recommendations using video feedback. SLP Visit Diagnosis Dysphagia, oropharyngeal phase (R13.12) Attention and concentration deficit following -- Frontal lobe and executive function deficit following -- Impact on safety and function Mild aspiration risk   CHL IP TREATMENT RECOMMENDATION 01/19/2021 Treatment Recommendations Therapy as outlined in treatment plan below   Prognosis 01/19/2021 Prognosis for Safe Diet Advancement Good Barriers to Reach Goals -- Barriers/Prognosis Comment -- CHL IP DIET RECOMMENDATION 01/19/2021 SLP Diet Recommendations Dysphagia 3 (Mech soft) solids;Thin liquid Liquid Administration via Cup;No straw Medication Administration Whole meds with puree Compensations Slow rate;Small sips/bites Postural Changes Remain semi-upright after after feeds/meals (Comment);Seated upright at 90 degrees   CHL IP OTHER RECOMMENDATIONS 01/19/2021 Recommended Consults -- Oral Care Recommendations Oral care BID Other Recommendations --   CHL IP FOLLOW UP  RECOMMENDATIONS  01/19/2021 Follow up Recommendations Home health SLP;Outpatient SLP   CHL IP FREQUENCY AND DURATION 01/19/2021 Speech Therapy Frequency (ACUTE ONLY) min 1 x/week Treatment Duration 2 weeks      CHL IP ORAL PHASE 01/19/2021 Oral Phase Impaired Oral - Pudding Teaspoon -- Oral - Pudding Cup -- Oral - Honey Teaspoon -- Oral - Honey Cup -- Oral - Nectar Teaspoon WFL Oral - Nectar Cup WFL;Right pocketing in lateral sulci;Left pocketing in lateral sulci;Other (Comment) Oral - Nectar Straw WFL Oral - Thin Teaspoon WFL Oral - Thin Cup WFL Oral - Thin Straw WFL;Premature spillage Oral - Puree WFL Oral - Mech Soft Impaired mastication;Reduced posterior propulsion;Weak lingual manipulation;Premature spillage Oral - Regular -- Oral - Multi-Consistency Other (Comment) Oral - Pill WFL Oral Phase - Comment --  CHL IP PHARYNGEAL PHASE 01/19/2021 Pharyngeal Phase Impaired Pharyngeal- Pudding Teaspoon -- Pharyngeal -- Pharyngeal- Pudding Cup -- Pharyngeal -- Pharyngeal- Honey Teaspoon -- Pharyngeal -- Pharyngeal- Honey Cup -- Pharyngeal -- Pharyngeal- Nectar Teaspoon WFL Pharyngeal Material does not enter airway Pharyngeal- Nectar Cup [facility] Pharyngeal Material does not enter airway Pharyngeal- Nectar Straw Penetration/Aspiration during swallow Pharyngeal Material enters airway, remains ABOVE vocal cords then ejected out Pharyngeal- Thin Teaspoon WFL Pharyngeal Material does not enter airway Pharyngeal- Thin Cup Delayed swallow initiation-pyriform sinuses;Reduced airway/laryngeal closure;Reduced laryngeal elevation Pharyngeal Material enters airway, remains ABOVE vocal cords then ejected out Pharyngeal- Thin Straw Delayed swallow initiation-pyriform sinuses;Reduced laryngeal elevation;Reduced airway/laryngeal closure;Penetration/Aspiration during swallow Pharyngeal Material enters airway, remains ABOVE vocal cords then ejected out;Material enters airway, remains ABOVE vocal cords and not ejected out Pharyngeal- Puree WFL Pharyngeal Material  does not enter airway Pharyngeal- Mechanical Soft WFL Pharyngeal Material does not enter airway Pharyngeal- Regular -- Pharyngeal -- Pharyngeal- Multi-consistency Penetration/Aspiration before swallow;Reduced airway/laryngeal closure;Reduced laryngeal elevation Pharyngeal Material enters airway, remains ABOVE vocal cords and not ejected out Pharyngeal- Pill [facility] Pharyngeal Material does not enter airway Pharyngeal Comment --  CHL IP CERVICAL ESOPHAGEAL PHASE 01/19/2021 Cervical Esophageal Phase WFL Pudding Teaspoon -- Pudding Cup -- Honey Teaspoon -- Honey Cup -- Nectar Teaspoon -- Nectar Cup -- Nectar Straw -- Thin Teaspoon -- Thin Cup -- Thin Straw -- Puree -- Mechanical Soft -- Regular -- Multi-consistency -- Pill -- Cervical Esophageal Comment -- Kathleen Lime, MS [facility] SLP Acute Rehab Services Office [phone] Pager [phone] Macario Golds 01/19/2021, 4:03 PM              ECHOCARDIOGRAM COMPLETE  Result Date: 01/19/2021    ECHOCARDIOGRAM REPORT   Patient Name:   JARULE SAMPATH Date of Exam: 01/19/2021 Medical Rec #:  [id number]        Height:       69.0 in Accession #:    [id number]       Weight:       277.1 lb Date of Birth:  1950/11/09        BSA:          2.373 m Patient Age:    32 years         BP:           138/73 mmHg Patient Gender: M                HR:           94 bpm. Exam Location:  Inpatient Procedure: 2D Echo, Cardiac Doppler and Color Doppler Indications:    Stroke  History:        Patient has no prior history of Echocardiogram examinations.  Arrythmias:Atrial Fibrillation; Risk Factors:Hypertension.  Sonographer:    Bernadene Person RDCS Referring Phys: [id number] [place] T TU  Sonographer Comments: Technically difficult study due to poor echo windows. Image acquisition challenging due to respiratory motion and Image acquisition challenging due to patient body habitus. Pt unable to turn on his side. IMPRESSIONS  1. Left ventricular ejection fraction, by estimation, is 60 to 65%. Left  ventricular ejection fraction by PLAX is 66 %. The left ventricle has normal function. The left ventricle has no regional wall motion abnormalities. Left ventricular diastolic parameters are consistent with Grade I diastolic dysfunction (impaired relaxation). Elevated left ventricular end-diastolic pressure.  2. Right ventricular systolic function is normal. The right ventricular size is normal. There is normal pulmonary artery systolic pressure.  3. The mitral valve is normal in structure. No evidence of mitral valve regurgitation. No evidence of mitral stenosis.  4. The aortic valve is normal in structure. There is mild calcification of the aortic valve. Aortic valve regurgitation is not visualized. No aortic stenosis is present.  5. Aortic dilatation noted. There is mild dilatation of the aortic root, measuring 37 mm. There is mild dilatation of the ascending aorta, measuring 40 mm.  6. The inferior vena cava is normal in size with greater than 50% respiratory variability, suggesting right atrial pressure of 3 mmHg. FINDINGS  Left Ventricle: Left ventricular ejection fraction, by estimation, is 60 to 65%. Left ventricular ejection fraction by PLAX is 66 %. The left ventricle has normal function. The left ventricle has no regional wall motion abnormalities. The left ventricular internal cavity size was normal in size. There is no left ventricular hypertrophy. Left ventricular diastolic parameters are consistent with Grade I diastolic dysfunction (impaired relaxation). Elevated left ventricular end-diastolic pressure. Right Ventricle: The right ventricular size is normal. No increase in right ventricular wall thickness. Right ventricular systolic function is normal. There is normal pulmonary artery systolic pressure. The tricuspid regurgitant velocity is 1.94 m/s, and  with an assumed right atrial pressure of 3 mmHg, the estimated right ventricular systolic pressure is [id number] mmHg. Left Atrium: Left atrial size was  normal in size. Right Atrium: Right atrial size was normal in size. Pericardium: There is no evidence of pericardial effusion. Mitral Valve: The mitral valve is normal in structure. No evidence of mitral valve regurgitation. No evidence of mitral valve stenosis. Tricuspid Valve: The tricuspid valve is normal in structure. Tricuspid valve regurgitation is trivial. No evidence of tricuspid stenosis. Aortic Valve: The aortic valve is normal in structure. There is mild calcification of the aortic valve. Aortic valve regurgitation is not visualized. No aortic stenosis is present. Pulmonic Valve: The pulmonic valve was normal in structure. Pulmonic valve regurgitation is not visualized. No evidence of pulmonic stenosis. Aorta: Aortic dilatation noted. There is mild dilatation of the aortic root, measuring 37 mm. There is mild dilatation of the ascending aorta, measuring 40 mm. Venous: The inferior vena cava is normal in size with greater than 50% respiratory variability, suggesting right atrial pressure of 3 mmHg. IAS/Shunts: No atrial level shunt detected by color flow Doppler.  LEFT VENTRICLE PLAX 2D LV EF:         Left            Diastology                ventricular     LV e' medial:    5.87 cm/s                ejection  LV E/e' medial:  10.6                fraction by     LV e' lateral:   10.70 cm/s                PLAX is 66      LV E/e' lateral: 5.8                %. LVIDd:         5.40 cm LVIDs:         3.40 cm LV PW:         1.00 cm LV IVS:        0.80 cm LVOT diam:     2.30 cm LVOT Area:     4.15 cm  RIGHT VENTRICLE RV S prime:     17.00 cm/s TAPSE (M-mode): 1.9 cm LEFT ATRIUM           Index LA diam:      3.70 cm 1.56 cm/m LA Vol (A4C): 42.1 ml 17.74 ml/m   AORTA Ao Root diam: 3.70 cm Ao Asc diam:  4.00 cm MITRAL VALVE               TRICUSPID VALVE MV Area (PHT): 3.02 cm    TR Peak grad:   15.1 mmHg MV Decel Time: 251 msec    TR Vmax:        194.00 cm/s MV E velocity: 62.40 cm/s MV A velocity: 82.60  cm/s  SHUNTS MV E/A ratio:  0.76        Systemic Diam: 2.30 cm Skeet Latch MD Electronically signed by Skeet Latch MD Signature Date/Time: 01/19/2021/12:27:50 PM    Final    VAS US CAROTID (at [facility] and [facility] only)  Result Date: 01/18/2021 Carotid Arterial Duplex Study Patient Name:  KAIRON PELOSO  Date of Exam:   01/18/2021 Medical Rec #: [id number]         Accession #:    [id number] Date of Birth: 14-May-1951         Patient Gender: M Patient Age:   70Y Exam Location:  [facility] Procedure:      VAS US CAROTID Referring Phys: [id number] [place] T TU --------------------------------------------------------------------------------  Indications:       CVA. Risk Factors:      Hypertension. Comparison Study:  no prior Performing Technologist: Archie Patten RVS  Examination Guidelines: A complete evaluation includes B-mode imaging, spectral Doppler, color Doppler, and power Doppler as needed of all accessible portions of each vessel. Bilateral testing is considered an integral part of a complete examination. Limited examinations for reoccurring indications may be performed as noted.  Right Carotid Findings: +----------+--------+--------+--------+-------------------------+---------+           PSV cm/sEDV cm/sStenosisPlaque Description       Comments  +----------+--------+--------+--------+-------------------------+---------+ CCA Prox  112     16              heterogenous                       +----------+--------+--------+--------+-------------------------+---------+ CCA Distal93      24              heterogenous and calcificShadowing +----------+--------+--------+--------+-------------------------+---------+ ICA Prox  87      22      1-39%   heterogenous                       +----------+--------+--------+--------+-------------------------+---------+  ICA Distal105     24                                                  +----------+--------+--------+--------+-------------------------+---------+ ECA       119     14                                                 +----------+--------+--------+--------+-------------------------+---------+ +----------+--------+-------+--------+-------------------+           PSV cm/sEDV cmsDescribeArm Pressure (mmHG) +----------+--------+-------+--------+-------------------+ [id number]                                         +----------+--------+-------+--------+-------------------+ +---------+--------+--+--------+--+---------+ VertebralPSV cm/s51EDV cm/s10Antegrade +---------+--------+--+--------+--+---------+  Left Carotid Findings: +----------+--------+--------+--------+------------------+--------+           PSV cm/sEDV cm/sStenosisPlaque DescriptionComments +----------+--------+--------+--------+------------------+--------+ CCA Prox  93      17              heterogenous               +----------+--------+--------+--------+------------------+--------+ CCA Distal83      18              heterogenous               +----------+--------+--------+--------+------------------+--------+ ICA Prox  85      30      1-39%   heterogenous               +----------+--------+--------+--------+------------------+--------+ ICA Distal79      24                                         +----------+--------+--------+--------+------------------+--------+ ECA       101     9                                          +----------+--------+--------+--------+------------------+--------+ +----------+--------+--------+--------+-------------------+           PSV cm/sEDV cm/sDescribeArm Pressure (mmHG) +----------+--------+--------+--------+-------------------+ [id number]                                          +----------+--------+--------+--------+-------------------+ +---------+--------+--+--------+-+---------+ VertebralPSV cm/s36EDV  cm/s9Antegrade +---------+--------+--+--------+-+---------+   Summary: Right Carotid: Velocities in the right ICA are consistent with a 1-39% stenosis. Left Carotid: Velocities in the left ICA are consistent with a 1-39% stenosis. Vertebrals: Bilateral vertebral arteries demonstrate antegrade flow. *See table(s) above for measurements and observations.     Preliminary    CT OUTSIDE FILMS CHEST  Result Date: 01/09/2021 This examination belongs to an outside facility and is stored here for comparison purposes only.  Contact the originating outside institution for any associated report or interpretation.    PHYSICAL EXAM  Temp:  [98.4 F (36.9 C)-99 F (37.2 C)] 98.7 F (37.1 C) (07/29 1700) Pulse Rate:  [88-104] 98 (07/29 1700) Resp:  [16-23]  23 (07/29 1700) BP: (119-144)/(68-78) 141/78 (07/29 1700) SpO2:  [92 %-97 %] 93 % (07/29 1700)  General - Well nourished, well developed, in no apparent distress.  Ophthalmologic - fundi not visualized due to noncooperation.  Cardiovascular - Regular rhythm and intermittent tachycardia.  Neuro - sleepy but easily arousable to open eyes on voice, not orientated to age, place, or time. Partial global aphasia, although fluent language output but frequent paraphasic errors and word salad, follows some simple commands but not all of them, anomia but able to repeat simple sentences. No gaze palsy, tracking bilaterally, but right lower quadratanopia. Mild right facial droop. Tongue midline. Bilateral UEs 5/5, no drift. Bilaterally LEs 4/5, no drift. Sensation sand coordination not cooperative due to aphasia, gait not tested.    ASSESSMENT/PLAN Mr. KIROS BISCHOFF is a 70 y.o. male with history of hypertension, obesity, OSA, possible liver cancer with metastasis, cirrhosis admitted for altered mental status. No tPA given due to also window.    Stroke:  left MCA and MCA/ACA moderate size infarct, punctate left occipital and right MCA/ACA infarcts, embolic  pattern, likely secondary to hypercoagulable state secondary to advanced malignancy Resultant partial global aphasia MRI Patchy and confluent acute to early subacute ischemic infarcts involving the left cerebral hemisphere. Associated petechial hemorrhage without frank intraparenchymal hematoma or significant mass effect. Few additional subcentimeter acute to early subacute ischemic infarcts involving the subcortical right frontal lobe and superior left cerebellum. MRA unremarkable Carotid Doppler unremarkable 2D Echo EF 60 to 65% LE venous Doppler pending LDL 73 HgbA1c 5.8 Lovenox for VTE prophylaxis aspirin 81 mg daily prior to admission, now on aspirin 325 mg daily. Consider DOAC 5-7 days post stroke to avoid hemorrhagic conversion (early next week) for stroke prevention in the setting of hypercoaguable state secondary to advance malignancy. Once DOAC started, ASA can be discontinued Patient counseled to be compliant with his antithrombotic medications Therapy recommendations:  HH OT Disposition:  pending  Advanced malignancy  Oncology Dr. Burr Medico on board Concerning for liver cancer with extensive bulky abdominal and retroperitoneal metastasis Status post paraspinal biopsy today Concerning for hypercoagulable state INR 1.2, AST/ALT 21/10 LE venous Doppler pending Consider DOAC early next week for hypercoagulable state  Hypertension Stable Long term BP goal normotensive  Hyperlipidemia Home meds: None LDL 73, goal < 70 Now on Lipitor 20, no high intensity statin given advanced liver cancer and LDL near goal. Continue statin at discharge  Other Stroke Risk Factors Advanced age Obesity, Body mass index is 40.92 kg/m.  Obstructive sleep apnea, followed with Dr. Rexene Alberts  Other Active Problems Thrombocytopenia, platelet 89->113-> 117 UTI, UA WBC > 50, on Rocephin  Hospital day # 2  I discussed with Dr. Tana Coast and Dr. Burr Medico. I spent  35 minutes in total face-to-face time with the  patient, more than 50% of which was spent in counseling and coordination of care, reviewing test results, images and medication, and discussing the diagnosis, treatment plan and potential prognosis. This patient's care requiresreview of multiple databases, neurological assessment, discussion with family, other specialists and medical decision making of high complexity.  Tried to give wife an update but she did not pick up the phone. Will attempt tomorrow by stroke service.    Rosalin Hawking, MD PhD Stroke Neurology 01/19/2021 6:03 PM    To contact Stroke Continuity provider, please refer to [email or web address] After hours, contact General Neurology

## 2021-01-19 NOTE — Progress Notes (Addendum)
  Speech Language Pathology Treatment: Dysphagia  Patient Details Name: Timothy Barton MRN: [id number] DOB: 1951/02/24 Today's Date: 01/19/2021 Time: [id number] SLP Time Calculation (min) (ACUTE ONLY): 20 min  Assessment / Plan / Recommendation Clinical Impression  SlP follow up with pt's wife and daugher to review MBS findings.  Radiology technologist advised that pt's wife stated she did not have to travel with pt or MBS as she expected SLP to come to room following exam and review findings.  Pt lying in bed, lethargic and npo for procedure.  Reviewed MBS flouro loops with pt's spouse and daughter reviewing clinical reasoning for precautions.  Importance of oral care - at least BID - prefer TID for pulmonary health especially if pt is immunuocompromised.  Advised to consider reverse trendelenburg for optimal position due to pt's abdominal size contributing to discomfort with sitting upright.  Had trialed chin tuck posture clinically yesterday and advised it worsened airway protection during MBS - so do not conduct.  Pt will benefit from follow up SLP via [facility] or OP to maximize his laryngeal closure for airway protection with swallowing and for language treatment to decrease caregiver burden.   Provided communication boards x2 for pt family to trial to facili tate communication.   Using teach back, pt's wife and daughter agreeable to plan and report understanding.  Pt taken to xray by staff for biopsy. Will follow.    HPI HPI: TAB MCLOUTH is a 70 y.o. male presenting to the emergency room for evaluation of altered mental status. MRI imaging shows patchy and confluent acute to early subacute ischemic infarcts  involving the left cerebral hemisphere as above. Associated  petechial hemorrhage without frank intraparenchymal hematoma or  significant mass effect. and a Few additional subcentimeter acute to early subacute ischemic  infarcts involving the subcortical right frontal lobe and superior  left  cerebellum. 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      SLP Plan  Continue with current plan of care  Patient needs continued Speech Lanaguage Pathology Services    Recommendations  Diet recommendations: Dysphagia 3 (mechanical soft);Thin liquid Liquids provided via: Cup;No straw Medication Administration: Whole meds with puree Supervision: Patient able to self feed;Trained caregiver to feed patient Compensations: Slow rate;Small sips/bites Postural Changes and/or Swallow Maneuvers: Seated upright 90 degrees;Upright 30-60 min after meal                Oral Care Recommendations: Oral care BID Follow up Recommendations: Home health SLP;Outpatient SLP SLP Visit Diagnosis: Dysphagia, unspecified (R13.10) Plan: Continue with current plan of care       GO               Kathleen Lime, MS [place] Office [phone] Pager [phone]  Macario Golds 01/19/2021, 4:12 PM

## 2021-01-19 NOTE — Progress Notes (Signed)
The patient has returned from procedure.Dressing to left flank is clean, dry and intact. See flow sheet for nero assessment. Will continue to monitor

## 2021-01-19 NOTE — TOC CAGE-AID Note (Signed)
Transition of Care [facility]) - CAGE-AID Screening   Patient Details  Name: Timothy Barton MRN: [id number] Date of Birth: Sep 08, 1950  Transition of Care [facility]) CM/SW Contact:    Gaetano Hawthorne Tarpley-Carter, Mio Phone Number: 01/19/2021, 2:46 PM   Clinical Narrative: Pt participated in [place].  Pt did admit to ETOH use, denies substance use.  Pt was offered resources.  Pt stated they did not feel that they were in need of resources at this time.   Ova Gillentine Tarpley-Carter, MSW, LCSW-A Pronouns:  She/Her/Hers [facility] HealthTransitions of Care Clinical Social Worker Direct Number:  [phone] [email or web address]   CAGE-AID Screening:    Have You Ever Felt You Ought to Cut Down on Your Drinking or Drug Use?: Yes Have People Annoyed You By [employer] Your Drinking Or Drug Use?: Yes Have You Felt Bad Or Guilty About Your Drinking Or Drug Use?: Yes Have You Ever Had a Drink or Used Drugs First Thing In The Morning to Steady Your Nerves or to Get Rid of a Hangover?: No CAGE-AID Score: 3  Substance Abuse Education Offered: Yes  Substance abuse interventions: Scientist, clinical (histocompatibility and immunogenetics)

## 2021-01-19 NOTE — Progress Notes (Addendum)
[facility]                                                                              Patient Demographics  Timothy Barton, is a 70 y.o. male, DOB - Feb 09, 1951, [id number]  Admit date - 01/17/2021   Admitting Physician Catriona Dillenbeck Krystal Eaton, MD  Outpatient Primary MD for the patient is Medicine, [facility]  Outpatient specialists:   LOS - 2  days   Medical records reviewed and are as summarized below:    Chief Complaint  Patient presents with   Altered Mental Status       Brief summary   Patient is a 70 year old male with newly diagnosed multifocal [place] with suspected metastasis, known alcoholic fatty liver, liver cirrhosis, hypertension, OSA, obesity presented with concerns for altered mental status. Per wife, patient was alert and oriented to self only and a day before the admission he was in normal state of health.  He had gone to cancer center and drove himself to get a haircut.  However on the day of admission he woke up and began to say things that do not make sense.  Patient also reported split vision when he tried to see with both eyes, did not recognize his wife and could barely follow commands. No nausea vomiting or diarrhea, chronically has cough for the past month and a half.  No fevers. In ED, UA positive for UTI.  Patient was started on IV Rocephin.  Ammonia level normal. Creatinine 1.68 from prior of 1.15. MRI brain showed acute to early subacute ischemic infarcts involving the left cerebral hemisphere, associated petechial hemorrhage without intraparenchymal hematoma or mass-effect.  Few additional subcentimeter acute to early subacute ischemic infarcts involving the subcortical right frontal lobe and superior left cerebellum.  Assessment & Plan    Principal Problem:   Acute CVA (cerebrovascular accident) ([place]) -MRI of the brain showed acute to early subacute ischemic infarcts involving left cerebral hemisphere with petechial hemorrhage, early  subacute ischemic infarcts involving subcortical right frontal lobe, superior left cerebellum -Neurology was consulted, recommended to hold aspirin for now due to petechial hemorrhage -MRA showed no proximal intracranial vessel occlusion or significant stenosis -Carotid Dopplers with 1 to 39% stenosis of right and left carotid artery -2D echo showed EF of 60 to 65%, G1 DD -LDL 73, d/w Dr Erlinda Hong, started on Lipitor 20 mg daily - Dr Erlinda Hong will see patient today and discussed with family regarding anticoagulation as possibly hypercoagulable state due to malignancy -Hemoglobin A1c 5.8 -SLP evaluation recommended dysphagia 3 diet, mechanical soft -PT evaluation recommended home health PT -Await further recommendations from stroke service.  Ordered venous Dopplers lower extremities to rule out DVT per neuro recommendations.  Active Problems: Urinary tract infection with acute metabolic encephalopathy -Urine culture showed more than 100,000 colonies of E. 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 Pilar Plate following, recommended to avoid invasive work-up of stroke, overall guarded prognosis from malignancy standpoint     AKI (acute kidney injury) ([place]) -Creatinine 1.68 on admission, was 1.1 on 7/20 -Placed on IV fluid hydration, creatinine improved to 1.2  Anemia with Thrombocytopenia (HCC), chronic - PLT count improving, H&H currently close to baseline  Abdominal pain -Patient reported mild abdominal pain and bloating, x-ray showed no SBO but possibly mild focal ileus -Will place on bowel regimen, states he did have a BM yesterday   Obesity Estimated body mass index is 40.92 kg/m as calculated from the following:   Height as of this encounter: '5\' 9"'$  (1.753 m).   Weight as of this encounter: 125.7 kg.  Code Status: Full  DVT Prophylaxis:  enoxaparin (LOVENOX) injection 40 mg Start: 01/17/21  2000   Level of Care: Level of care: Telemetry Medical Family Communication: Discussed all imaging results, lab results, explained to the patient and wife on phone.    Disposition Plan:     Status is: Inpatient  Inpatient level of care appropriate due to severity of illness  Dispo: The patient is from: Home              Anticipated d/c is to: Home              Patient currently is not medically stable to d/c.  Pending Doppler ultrasound lower extremities, stroke service recommendations.  Cancelled transfer to [facility].   Difficult to place patient No      Time Spent in minutes   25 minutes  Procedures:  MRI brain, MRA 2D echo  Consultants:   Neurology Oncology  Antimicrobials:   Anti-infectives (From admission, onward)    Start     Dose/Rate Route Frequency Ordered Stop   01/17/21 2000  cefTRIAXone (ROCEPHIN) 1 g in sodium chloride 0.9 % 100 mL IVPB        1 g 200 mL/hr over 30 Minutes Intravenous Every 24 hours 01/17/21 1935     01/17/21 1930  cefTRIAXone (ROCEPHIN) 1 g in sodium chloride 0.9 % 100 mL IVPB  Status:  Discontinued        1 g 200 mL/hr over 30 Minutes Intravenous  Once 01/17/21 1924 01/17/21 1948          Medications  Scheduled Meds:   stroke: mapping our early stages of recovery book   Does not apply Once   atorvastatin  20 mg Oral Daily   enoxaparin (LOVENOX) injection  40 mg Subcutaneous Q24H   Continuous Infusions:  cefTRIAXone (ROCEPHIN)  IV Stopped (01/18/21 2159)   lactated ringers 75 mL/hr at 01/19/21 0522   PRN Meds:.      Subjective:   Timothy Barton was seen and examined today.  Much more alert and awake today, still has some confusion, follows commands.  No chest pain, dizziness, shortness of breath, nausea or vomiting.   Objective:   Vitals:   01/19/21 0206 01/19/21 0449 01/19/21 0620 01/19/21 0925  BP: 131/69 132/68 138/73 126/75  Pulse: 97 97 88 93  Resp: (!) 22 (!) '22 16 20  '$ Temp: 98.9 F (37.2 C) 98.5 F  (36.9 C) 98.7 F (37.1 C) 98.7 F (37.1 C)  TempSrc: Oral Oral Oral Oral  SpO2: 93% 97% 92% 94%  Weight:      Height:        Intake/Output Summary (Last 24 hours) at 01/19/2021 1340 Last data filed at 01/19/2021 0926 Gross per 24 hour  Intake 1543.62 ml  Output 650 ml  Net 893.62 ml     Wt Readings from Last 3 Encounters:  01/18/21 125.7 kg  01/17/21 124.6 kg  01/10/21 120.8 kg   Physical Exam General: Much more alert and awake today however  still has confusion, expressive aphasia Cardiovascular: S1 S2 clear, RRR. No pedal edema b/l Respiratory: CTAB, no wheezing Gastrointestinal: Soft, nontender, nondistended, NBS Ext: no pedal edema bilaterally Neuro: strength 5/5 in upper and lower extremities Skin: No rashes Psych: confusion    Data Reviewed:  I have personally reviewed following labs and imaging studies  Micro Results Recent Results (from the past 240 hour(s))  Urine Culture     Status: Abnormal   Collection Time: 01/17/21  1:30 PM   Specimen: Urine, Clean Catch  Result Value Ref Range Status   Specimen Description   Final    URINE, CLEAN CATCH Performed at [facility], [address], [place], [place] [address]    Special Requests   Final    NONE Performed at [facility], [place] [address] [place], [place] [address]    Culture >=100,000 COLONIES/mL ESCHERICHIA COLI (A)  Final   Report Status 01/19/2021 FINAL  Final   Organism ID, Bacteria ESCHERICHIA COLI (A)  Final      Susceptibility   Escherichia coli - MIC*    AMPICILLIN 8 SENSITIVE Sensitive     CEFAZOLIN <=4 SENSITIVE Sensitive     CEFEPIME <=0.12 SENSITIVE Sensitive     CEFTRIAXONE <=0.25 SENSITIVE Sensitive     CIPROFLOXACIN <=0.25 SENSITIVE Sensitive     GENTAMICIN <=1 SENSITIVE Sensitive     IMIPENEM <=0.25 SENSITIVE Sensitive     NITROFURANTOIN <=16 SENSITIVE Sensitive     TRIMETH/SULFA <=20 SENSITIVE Sensitive     AMPICILLIN/SULBACTAM 4  SENSITIVE Sensitive     PIP/TAZO <=4 SENSITIVE Sensitive     * >=100,000 COLONIES/mL ESCHERICHIA COLI  SARS CORONAVIRUS 2 (TAT 6-24 HRS) Nasopharyngeal Nasopharyngeal Swab     Status: None   Collection Time: 01/17/21  8:24 PM   Specimen: Nasopharyngeal Swab  Result Value Ref Range Status   SARS Coronavirus 2 NEGATIVE NEGATIVE Final    Comment: (NOTE) SARS-CoV-2 target nucleic acids are NOT DETECTED.  The SARS-CoV-2 RNA is generally detectable in upper and lower respiratory specimens during the acute phase of infection. Negative results do not preclude SARS-CoV-2 infection, do not rule out co-infections with other pathogens, and should not be used as the sole basis for treatment or other patient management decisions. Negative results must be combined with clinical observations, patient history, and epidemiological information. The expected result is Negative.  Fact Sheet for Patients: SugarRoll.be  Fact Sheet for Healthcare Providers: https://www.woods-mathews.com/  This test is not yet approved or cleared by the [place] FDA and  has been authorized for detection and/or diagnosis of SARS-CoV-2 by FDA under an Emergency Use Authorization (EUA). This EUA will remain  in effect (meaning this test can be used) for the duration of the COVID-19 declaration under Se ction 564(b)(1) of the Act, 21 U.S.C. section 360bbb-3(b)(1), unless the authorization is terminated or revoked sooner.  Performed at [facility], [place] [address] [place], [place] [address]   Urine Culture     Status: Abnormal (Preliminary result)   Collection Time: 01/17/21  8:24 PM   Specimen: Urine, Clean Catch  Result Value Ref Range Status   Specimen Description   Final    URINE, CLEAN CATCH Performed at [facility], [place] [address], [place], [place] [address]    Special Requests   Final    NONE Performed at [facility],  [place] [address], [place], [place] [address]    Culture >=100,000 COLONIES/mL ESCHERICHIA COLI (  A)  Final   Report Status PENDING  Incomplete    Radiology Reports DG Chest 2 View  Result Date: 01/17/2021 CLINICAL DATA:  acute mental status change, cancer, r/o infection EXAM: CHEST - 2 VIEW COMPARISON:  12/13/2020 FINDINGS: Cardiomediastinal contours are within normal limits. Atherosclerotic calcification of the aortic knob. Small bilateral pleural effusions with associated hazy bibasilar opacities. No pneumothorax. IMPRESSION: Small bilateral pleural effusions with associated hazy bibasilar opacities which may represent atelectasis versus infection. Electronically Signed   By: Davina Poke D.O.   On: 01/17/2021 14:57   MR ANGIO HEAD WO CONTRAST  Result Date: 01/18/2021 CLINICAL DATA:  Stroke, follow-up EXAM: MRA HEAD WITHOUT CONTRAST TECHNIQUE: Angiographic images of the Circle of Willis were acquired using MRA technique without intravenous contrast. COMPARISON:  No pertinent prior exam. FINDINGS: Intracranial internal carotid arteries are patent. Middle and anterior cerebral arteries are patent. Intracranial vertebral arteries, basilar artery, posterior cerebral arteries are patent. Bilateral posterior communicating arteries are present. There is no significant stenosis or aneurysm. IMPRESSION: No proximal intracranial vessel occlusion or significant stenosis. Electronically Signed   By: Macy Mis M.D.   On: 01/18/2021 08:07   MR BRAIN W WO CONTRAST  Result Date: 01/17/2021 CLINICAL DATA:  Initial evaluation for altered mental status, unknown cause. EXAM: MRI HEAD WITHOUT AND WITH CONTRAST TECHNIQUE: Multiplanar, multiecho pulse sequences of the brain and surrounding structures were obtained without and with intravenous contrast. CONTRAST:  65m GADAVIST GADOBUTROL 1 MMOL/ML IV SOLN COMPARISON:  CT from 11/25/2020. FINDINGS: Brain: Generalized age-related cerebral atrophy. Scattered patchy  T2/FLAIR hyperintensity within the periventricular deep white matter both cerebral hemispheres most consistent with chronic small vessel ischemic disease, mild in nature. Small remote right cerebellar infarct noted. Patchy and confluent restricted diffusion involving the cortical and subcortical aspects of the left frontal, parietal, occipital, and temporal lobes, consistent with acute to early subacute ischemic infarcts. Largest area of involvement present at the left temporal lobe and measures up to 6 cm in diameter. Associated petechial hemorrhage without frank intraparenchymal hematoma or significant mass effect. Few additional subcentimeter acute to early subacute ischemic infarcts noted at the subcortical right frontal lobe (series 5, image 85) and superior left cerebellum (series 5, image 62). Gray-white matter differentiation otherwise maintained. No other evidence for acute or chronic intracranial hemorrhage. No mass lesion, midline shift, or significant mass effect. No hydrocephalus or extra-axial fluid collection. Pituitary gland suprasellar region within normal limits. No abnormal enhancement or evidence for intracranial metastatic disease. Vascular: Major intracranial vascular flow voids are maintained. Skull and upper cervical spine: Craniocervical junction within normal limits. Multilevel degenerative spondylosis noted within the upper cervical spine with no more than mild spinal stenosis at C3-4. Bone marrow signal intensity heterogeneous without focal marrow replacing lesion. No scalp soft tissue abnormality. Sinuses/Orbits: Globes and orbital soft tissues within normal limits. Paranasal sinuses are largely clear. No significant mastoid effusion. Inner ear structures grossly normal. Other: None. IMPRESSION: 1. Patchy and confluent acute to early subacute ischemic infarcts involving the left cerebral hemisphere as above. Associated petechial hemorrhage without frank intraparenchymal hematoma or  significant mass effect. 2. Few additional subcentimeter acute to early subacute ischemic infarcts involving the subcortical right frontal lobe and superior left cerebellum. 3. No other acute intracranial abnormality. No evidence for intracranial metastatic disease. 4. Underlying age-related cerebral atrophy with mild chronic small vessel ischemic disease. Electronically Signed   By: BJeannine BogaM.D.   On: 01/17/2021 21:18   [place]Abdomen Limited  Result Date: 01/10/2021 CLINICAL  DATA:  Liver lesions, cirrhosis EXAM: LIMITED ABDOMEN ULTRASOUND FOR ASCITES TECHNIQUE: Limited ultrasound survey for ascites was performed in all four abdominal quadrants. COMPARISON:  MR 12/29/2020 FINDINGS: Scattered small-volume abdominal ascites as before. Right lobe liver mass as described on previous MR. IMPRESSION: Small volume abdominal ascites Electronically Signed   By: Lucrezia Europe M.D.   On: 01/10/2021 15:03   DG Abd Portable 1V  Result Date: 01/19/2021 CLINICAL DATA:  Abdominal pain EXAM: PORTABLE ABDOMEN - 1 VIEW COMPARISON:  MR abdomen 12/29/2020 FINDINGS: Nonspecific bowel gas pattern. Air-filled loops of large and small bowel are identified within the abdomen. In the left lower quadrant there are a few prominent air-filled loops of small bowel measuring up to 2.7 cm. IMPRESSION: 1. No signs of high-grade bowel obstruction. 2. Mildly prominent air-filled loops of small bowel in the left lower quadrant which may reflect focal ileus. Electronically Signed   By: Kerby Moors M.D.   On: 01/19/2021 12:57   ECHOCARDIOGRAM COMPLETE  Result Date: 01/19/2021    ECHOCARDIOGRAM REPORT   Patient Name:   Timothy Barton Date of Exam: 01/19/2021 Medical Rec #:  [id number]        Height:       69.0 in Accession #:    [id number]       Weight:       277.1 lb Date of Birth:  [birth year]        BSA:          2.373 m Patient Age:    16 years         BP:           138/73 mmHg Patient Gender: M                HR:           94  bpm. Exam Location:  Inpatient Procedure: 2D Echo, Cardiac Doppler and Color Doppler Indications:    Stroke  History:        Patient has no prior history of Echocardiogram examinations.                 Arrythmias:Atrial Fibrillation; Risk Factors:Hypertension.  Sonographer:    Bernadene Person RDCS Referring Phys: [id number] [place] T TU  Sonographer Comments: Technically difficult study due to poor echo windows. Image acquisition challenging due to respiratory motion and Image acquisition challenging due to patient body habitus. Pt unable to turn on his side. IMPRESSIONS  1. Left ventricular ejection fraction, by estimation, is 60 to 65%. Left ventricular ejection fraction by PLAX is 66 %. The left ventricle has normal function. The left ventricle has no regional wall motion abnormalities. Left ventricular diastolic parameters are consistent with Grade I diastolic dysfunction (impaired relaxation). Elevated left ventricular end-diastolic pressure.  2. Right ventricular systolic function is normal. The right ventricular size is normal. There is normal pulmonary artery systolic pressure.  3. The mitral valve is normal in structure. No evidence of mitral valve regurgitation. No evidence of mitral stenosis.  4. The aortic valve is normal in structure. There is mild calcification of the aortic valve. Aortic valve regurgitation is not visualized. No aortic stenosis is present.  5. Aortic dilatation noted. There is mild dilatation of the aortic root, measuring 37 mm. There is mild dilatation of the ascending aorta, measuring 40 mm.  6. The inferior vena cava is normal in size with greater than 50% respiratory variability, suggesting right atrial pressure of 3 mmHg. FINDINGS  Left Ventricle: Left ventricular  ejection fraction, by estimation, is 60 to 65%. Left ventricular ejection fraction by PLAX is 66 %. The left ventricle has normal function. The left ventricle has no regional wall motion abnormalities. The left ventricular  internal cavity size was normal in size. There is no left ventricular hypertrophy. Left ventricular diastolic parameters are consistent with Grade I diastolic dysfunction (impaired relaxation). Elevated left ventricular end-diastolic pressure. Right Ventricle: The right ventricular size is normal. No increase in right ventricular wall thickness. Right ventricular systolic function is normal. There is normal pulmonary artery systolic pressure. The tricuspid regurgitant velocity is 1.94 m/s, and  with an assumed right atrial pressure of 3 mmHg, the estimated right ventricular systolic pressure is [id number] mmHg. Left Atrium: Left atrial size was normal in size. Right Atrium: Right atrial size was normal in size. Pericardium: There is no evidence of pericardial effusion. Mitral Valve: The mitral valve is normal in structure. No evidence of mitral valve regurgitation. No evidence of mitral valve stenosis. Tricuspid Valve: The tricuspid valve is normal in structure. Tricuspid valve regurgitation is trivial. No evidence of tricuspid stenosis. Aortic Valve: The aortic valve is normal in structure. There is mild calcification of the aortic valve. Aortic valve regurgitation is not visualized. No aortic stenosis is present. Pulmonic Valve: The pulmonic valve was normal in structure. Pulmonic valve regurgitation is not visualized. No evidence of pulmonic stenosis. Aorta: Aortic dilatation noted. There is mild dilatation of the aortic root, measuring 37 mm. There is mild dilatation of the ascending aorta, measuring 40 mm. Venous: The inferior vena cava is normal in size with greater than 50% respiratory variability, suggesting right atrial pressure of 3 mmHg. IAS/Shunts: No atrial level shunt detected by color flow Doppler.  LEFT VENTRICLE PLAX 2D LV EF:         Left            Diastology                ventricular     LV e' medial:    5.87 cm/s                ejection        LV E/e' medial:  10.6                fraction by     LV  e' lateral:   10.70 cm/s                PLAX is 66      LV E/e' lateral: 5.8                %. LVIDd:         5.40 cm LVIDs:         3.40 cm LV PW:         1.00 cm LV IVS:        0.80 cm LVOT diam:     2.30 cm LVOT Area:     4.15 cm  RIGHT VENTRICLE RV S prime:     17.00 cm/s TAPSE (M-mode): 1.9 cm LEFT ATRIUM           Index LA diam:      3.70 cm 1.56 cm/m LA Vol (A4C): 42.1 ml 17.74 ml/m   AORTA Ao Root diam: 3.70 cm Ao Asc diam:  4.00 cm MITRAL VALVE               TRICUSPID VALVE MV Area (PHT): 3.02 cm    TR Peak  grad:   15.1 mmHg MV Decel Time: 251 msec    TR Vmax:        194.00 cm/s MV E velocity: 62.40 cm/s MV A velocity: 82.60 cm/s  SHUNTS MV E/A ratio:  0.76        Systemic Diam: 2.30 cm Skeet Latch MD Electronically signed by Skeet Latch MD Signature Date/Time: 01/19/2021/12:27:50 PM    Final    VAS US CAROTID (at [facility] and [facility] only)  Result Date: 01/18/2021 Carotid Arterial Duplex Study Patient Name:  Timothy Barton  Date of Exam:   01/18/2021 Medical Rec #: [id number]         Accession #:    [id number] Date of Birth: 11/19/1950         Patient Gender: M Patient Age:   70Y Exam Location:  [facility] Procedure:      VAS US CAROTID Referring Phys: [id number] [place] T TU --------------------------------------------------------------------------------  Indications:       CVA. Risk Factors:      Hypertension. Comparison Study:  no prior Performing Technologist: Archie Patten RVS  Examination Guidelines: A complete evaluation includes B-mode imaging, spectral Doppler, color Doppler, and power Doppler as needed of all accessible portions of each vessel. Bilateral testing is considered an integral part of a complete examination. Limited examinations for reoccurring indications may be performed as noted.  Right Carotid Findings: +----------+--------+--------+--------+-------------------------+---------+           PSV cm/sEDV cm/sStenosisPlaque Description       Comments   +----------+--------+--------+--------+-------------------------+---------+ CCA Prox  112     16              heterogenous                       +----------+--------+--------+--------+-------------------------+---------+ CCA Distal93      24              heterogenous and calcificShadowing +----------+--------+--------+--------+-------------------------+---------+ ICA Prox  87      22      1-39%   heterogenous                       +----------+--------+--------+--------+-------------------------+---------+ ICA Distal105     24                                                 +----------+--------+--------+--------+-------------------------+---------+ ECA       119     14                                                 +----------+--------+--------+--------+-------------------------+---------+ +----------+--------+-------+--------+-------------------+           PSV cm/sEDV cmsDescribeArm Pressure (mmHG) +----------+--------+-------+--------+-------------------+ [id number]                                         +----------+--------+-------+--------+-------------------+ +---------+--------+--+--------+--+---------+ VertebralPSV cm/s51EDV cm/s10Antegrade +---------+--------+--+--------+--+---------+  Left Carotid Findings: +----------+--------+--------+--------+------------------+--------+           PSV cm/sEDV cm/sStenosisPlaque DescriptionComments +----------+--------+--------+--------+------------------+--------+ CCA Prox  93      17  heterogenous               +----------+--------+--------+--------+------------------+--------+ CCA Distal83      18              heterogenous               +----------+--------+--------+--------+------------------+--------+ ICA Prox  85      30      1-39%   heterogenous               +----------+--------+--------+--------+------------------+--------+ ICA Distal79      24                                          +----------+--------+--------+--------+------------------+--------+ ECA       101     9                                          +----------+--------+--------+--------+------------------+--------+ +----------+--------+--------+--------+-------------------+           PSV cm/sEDV cm/sDescribeArm Pressure (mmHG) +----------+--------+--------+--------+-------------------+ [id number]                                          +----------+--------+--------+--------+-------------------+ +---------+--------+--+--------+-+---------+ VertebralPSV cm/s36EDV cm/s9Antegrade +---------+--------+--+--------+-+---------+   Summary: Right Carotid: Velocities in the right ICA are consistent with a 1-39% stenosis. Left Carotid: Velocities in the left ICA are consistent with a 1-39% stenosis. Vertebrals: Bilateral vertebral arteries demonstrate antegrade flow. *See table(s) above for measurements and observations.     Preliminary    CT OUTSIDE FILMS CHEST  Result Date: 01/09/2021 This examination belongs to an outside facility and is stored here for comparison purposes only.  Contact the originating outside institution for any associated report or interpretation.   Lab Data:  CBC: Recent Labs  Lab 01/17/21 1140  WBC 6.7  NEUTROABS 5.3  HGB 10.4*  HCT 31.8*  MCV 88.1  PLT 123XX123*   Basic Metabolic Panel: Recent Labs  Lab 01/17/21 1140 01/18/21 0402 01/19/21 0712  NA 139 139 137  K 3.8 3.6 3.6  CL 103 105 103  CO2 '26 26 24  '$ GLUCOSE 105* 113* 110*  BUN 49* 48* 43*  CREATININE 1.68* 1.60* 1.25*  CALCIUM 9.2 8.6* 8.7*   GFR: Estimated Creatinine Clearance: 73.1 mL/min (A) (by C-G formula based on SCr of 1.25 mg/dL (H)). Liver Function Tests: Recent Labs  Lab 01/17/21 1140  AST 21  ALT 10  ALKPHOS 72  BILITOT 0.7  PROT 6.5  ALBUMIN 2.6*   No results for input(s): LIPASE, AMYLASE in the last 168 hours. Recent Labs  Lab 01/17/21 1139  AMMONIA 24    Coagulation Profile: Recent Labs  Lab 01/19/21 1100  INR 1.2   Cardiac Enzymes: No results for input(s): CKTOTAL, CKMB, CKMBINDEX, TROPONINI in the last 168 hours. BNP (last 3 results) No results for input(s): PROBNP in the last 8760 hours. HbA1C: Recent Labs    01/18/21 0402  HGBA1C 5.8*   CBG: No results for input(s): GLUCAP in the last 168 hours. Lipid Profile: Recent Labs    01/18/21 0402  CHOL 115  HDL 25*  LDLCALC 73  TRIG 84  CHOLHDL 4.6   Thyroid Function Tests: Recent Labs    01/17/21  1139  TSH 1.067   Anemia Panel: No results for input(s): VITAMINB12, FOLATE, FERRITIN, TIBC, IRON, RETICCTPCT in the last 72 hours. Urine analysis:    Component Value Date/Time   COLORURINE YELLOW 01/17/2021 1330   APPEARANCEUR HAZY (A) 01/17/2021 1330   LABSPEC 1.016 01/17/2021 1330   PHURINE 5.0 01/17/2021 1330   GLUCOSEU NEGATIVE 01/17/2021 1330   HGBUR SMALL (A) 01/17/2021 1330   BILIRUBINUR NEGATIVE 01/17/2021 1330   KETONESUR NEGATIVE 01/17/2021 1330   PROTEINUR 30 (A) 01/17/2021 1330   PROTEINUR NEGATIVE 01/17/2021 1330   UROBILINOGEN 1.0 10/26/2014 1446   NITRITE POSITIVE (A) 01/17/2021 1330   LEUKOCYTESUR MODERATE (A) 01/17/2021 1330     Drago Hammonds M.D. [facility] 01/19/2021, 1:40 PM  Available via Epic secure chat 7am-7pm After 7 pm, please refer to night coverage provider listed on amion.

## 2021-01-19 NOTE — Evaluation (Signed)
Speech Language Pathology Evaluation Patient Details Name: Timothy Barton MRN: [id number] DOB: 1951-01-02 Today's Date: 01/19/2021 Time: [id number] SLP Time Calculation (min) (ACUTE ONLY): 25 min  Problem List:  Patient Active Problem List   Diagnosis Date Noted   Acute CVA (cerebrovascular accident) ([place]) 01/18/2021   AMS (altered mental status) 01/17/2021   AKI (acute kidney injury) ([place]) 01/17/2021   Anemia 01/17/2021   Thrombocytopenia ([place]) 01/17/2021   Hepatocellular carcinoma ([place]) 01/11/2021   Primary localized osteoarthritis of left hip 11/04/2014   Abnormality of gait 07/27/2012   Hip weakness 07/27/2012   Contact dermatitis 07/07/2012   Osteoarthritis of right hip 07/05/2012   Past Medical History:  Past Medical History:  Diagnosis Date   A-fib [facility])    Arthritis    Hypertension    Osteoarthritis of right hip 07/05/2012   Sleep apnea    Past Surgical History:  Past Surgical History:  Procedure Laterality Date   arthrosopy     BACK SURGERY  2006    ruptured disc,and repair of leak x3   CARPAL TUNNEL RELEASE  2002   right and left   KNEE ARTHROSCOPY  2006   right   TOTAL HIP ARTHROPLASTY  07/03/2012   Procedure: TOTAL HIP ARTHROPLASTY;  Surgeon: Yvette Rack., MD;  Location: [place];  Service: Orthopedics;  Laterality: Right;  with autograft   TOTAL HIP ARTHROPLASTY Left 11/04/2014   Procedure: TOTAL HIP ARTHROPLASTY;  Surgeon: Earlie Server, MD;  Location: [place];  Service: Orthopedics;  Laterality: Left;   HPI:  Timothy Barton is a 70 y.o. male presenting to the emergency room for evaluation of altered mental status. MRI imaging shows patchy and confluent acute to early subacute ischemic infarcts  involving the left cerebral hemisphere as above. Associated  petechial hemorrhage without frank intraparenchymal hematoma or  significant mass effect. and a Few additional subcentimeter acute to early subacute ischemic  infarcts involving the subcortical right  frontal lobe and superior  left cerebellum. 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   Assessment / Plan / Recommendation Clinical Impression  Portion of aphasia batter examination administered with pt demonstrating expressive more than receptive aphasia.  Pt is largely fluent He demonstrates strengths in automatic speech tasks, naming basic pictures, pointing to appropriate picture named, reading single words - matching to appropriate picture, and following simple directions at times with gestural cues.  Visual cues are very helpful to pt.  Areas of challenges include decreased comprehension of questions beyond basic and novel expression.  Frequent semantic and paraphasic errors noted with inconsistent recognition of errors.  Repetition is largely good for single words and short phrases.  Pt will benefit from skilled SLP to maximize his expressive and receptive language to decrease caregiver burden and provide him with QOL in improving effiency in communication.  Reviewed information with family following exam explaining communication is not only speech and pt using gestures frequently improving efficiency.    SLP Assessment  SLP Recommendation/Assessment: Patient needs continued Speech Lanaguage Pathology Services SLP Visit Diagnosis: Aphasia (R47.01)    Follow Up Recommendations  Home health SLP;Outpatient SLP    Frequency and Duration min 1 x/week  2 weeks      SLP Evaluation Cognition  Overall Cognitive Status: Impaired/Different from baseline Arousal/Alertness: Awake/alert Orientation Level: Oriented to person;Disoriented to situation;Disoriented to place;Disoriented to time (pt's language deficits impact his orientation) Attention:  Sustained Sustained Attention: Impaired Sustained Attention  Impairment: Verbal basic (pt is verbose, distracted by language deficits) Memory:  (today knew his wife, did not say her name) Awareness: Impaired (pt benefits from verbal cues to cease talking when asked direct questions due to his impaired understanding of tasks) Awareness Impairment: Intellectual impairment;Emergent impairment Problem Solving: Appears intact (for basic - puts head of bed up independently for po intake when requested) Safety/Judgment: Impaired (pt indicated he can get up independently when he is not safe)       Comprehension  Auditory Comprehension Overall Auditory Comprehension: Impaired Yes/No Questions: Impaired Basic Biographical Questions: 76-100% accurate Basic Immediate Environment Questions: 50-74% accurate Complex Questions: 0-24% accurate (0/10 questions answered correctly) Commands: Impaired One Step Basic Commands: 75-100% accurate (inconsistent - visual cue helpful) Two Step Basic Commands: 50-74% accurate Conversation: Simple Other Conversation Comments: Pt able to identify 8 objects correctly in picture field of 8; Pt able to name 4/6 objects without cue, 2/6 with written choice of 2.  Repetition - repeated simple words and sentences without deficits, multiple syllables with extra repetitions and complex sentences were not repeated accurately despite effort. Pt with frequent phonemic and semantic paraphasias.  Reading single one syllable words intact - but breakdown at multisyllabic words and simple sentence. EffectiveTechniques: Extra processing time;Repetition;Visual/Gestural cues Visual Recognition/Discrimination Discrimination: Not tested Reading Comprehension Reading Status: Impaired Word level: Within functional limits Sentence Level: Impaired Paragraph Level: Not tested Functional Environmental (signs, name badge): Not tested Effective Techniques: Large print    Expression Expression Primary Mode of Expression: Verbal Verbal  Expression Overall Verbal Expression: Impaired Initiation: Impaired Automatic Speech: Name;Counting;Singing Level of Generative/Spontaneous Verbalization: Sentence Repetition: Impaired Level of Impairment: Sentence level (multisyllabic words and sentences) Naming: Impairment Confrontation: Impaired Other Naming Comments: Pt able to name 4/6 objects without cue, 2/6 with written choice of 2.  Repetition - repeated simple words and sentences without deficits, multiple syllables with extra repetitions and complex sentences were not repeated accurately despite effort. Pt with frequent phonemic and semantic paraphasias.  Reading single one syllable words intact - but breakdown at multisyllabic words and simple sentence. Verbal Errors: Semantic paraphasias;Phonemic paraphasias;Perseveration;Other (comment) (inconsisently aware of errors) Pragmatics: No impairment Effective Techniques: Sentence completion;Phonemic cues;Written cues Non-Verbal Means of Communication: Gestures Written Expression Dominant Hand: Right Written Expression: Not tested   Oral / Motor      GO             Kathleen Lime, MS [facility] SLP Acute Rehab Services Office [phone] Pager [phone]        Macario Golds 01/19/2021, 2:46 PM

## 2021-01-19 NOTE — Progress Notes (Signed)
  Echocardiogram 2D Echocardiogram has been performed.  Fidel Levy 01/19/2021, 8:59 AM

## 2021-01-19 NOTE — Progress Notes (Signed)
The patient is currently off the unit. Will complete neurological assessment upon return.

## 2021-01-19 NOTE — Procedures (Signed)
Interventional Radiology Procedure Note  Date of Procedure: 01/19/2021  Procedure: US-guided core-needle biopsy of left paraspinal mass   Findings:  1. Successful US-guided core-needle biopsy  of left paraspinal mass x4 passes   Complications: No immediate complications noted.   Estimated Blood Loss: minimal  Follow-up and Recommendations: 1. Follow-up pathology results    Albin Felling, MD  Vascular & Interventional Radiology  01/19/2021 4:38 PM

## 2021-01-19 NOTE — Progress Notes (Signed)
Patient ID: Timothy Barton, male   DOB: 10-Nov-1950, 70 y.o.   MRN: [id number] Patient scheduled for CT-guided left paraspinal mass biopsy today.  Case and imaging studies have been reviewed by Dr. Pascal Lux and discussed with Dr. Burr Medico. Risks and benefits of procedure was discussed with the patient's spouse/daughter including, but not limited to bleeding, infection, damage to adjacent structures or low yield requiring additional tests.  All of the questions were answered and there is agreement to proceed.  Consent signed and in chart. Procedure to be done without IV conscious sedation.

## 2021-01-19 NOTE — Progress Notes (Signed)
Patient reassessed this am. Pt is alert, oriented to self,spouse, and day; disoriented to place, situation and year.

## 2021-01-19 NOTE — Progress Notes (Addendum)
Note SLP follow up - full report to follow.  Pt noted to continue to cough with intake today which family confirms is increasing in frequency today.  Pt consuming Icee, jello, icecream and water today per family.    Will proceed with MBS to assure pt is not aspirating.  Dr Burr Medico in room with pt, family when SLP returned with ice and water and SLP informed Dr Burr Medico, pt and his family of plan for MBS and that xray would be coming for him soon.  All in agreement.  Kathleen Lime, MS [facility] SLP Acute Rehab Services Office [phone] Pager [phone]

## 2021-01-19 NOTE — Progress Notes (Signed)
Attempted bilateral lower extremity venous duplex multiple times 01/19/21, patient in radiology/having biopsy procedure at each attempt. Will attempt again tomorrow 7/30 as schedule permits.  01/19/2021 3:55 PM Kelby Aline., MHA, RVT, RDCS, RDMS

## 2021-01-20 ENCOUNTER — Inpatient Hospital Stay (HOSPITAL_COMMUNITY): Payer: Medicare Other

## 2021-01-20 DIAGNOSIS — R1011 Right upper quadrant pain: Secondary | ICD-10-CM

## 2021-01-20 DIAGNOSIS — R609 Edema, unspecified: Secondary | ICD-10-CM

## 2021-01-20 DIAGNOSIS — I639 Cerebral infarction, unspecified: Secondary | ICD-10-CM

## 2021-01-20 DIAGNOSIS — R160 Hepatomegaly, not elsewhere classified: Secondary | ICD-10-CM

## 2021-01-20 LAB — CBC
HCT: 32.5 % — ABNORMAL LOW (ref 39.0–52.0)
Hemoglobin: 10.5 g/dL — ABNORMAL LOW (ref 13.0–17.0)
MCH: 29 pg (ref 26.0–34.0)
MCHC: 32.3 g/dL (ref 30.0–36.0)
MCV: 89.8 fL (ref 80.0–100.0)
Platelets: 111 10*3/uL — ABNORMAL LOW (ref 150–400)
RBC: 3.62 MIL/uL — ABNORMAL LOW (ref 4.22–5.81)
RDW: 14.6 % (ref 11.5–15.5)
WBC: 8.8 10*3/uL (ref 4.0–10.5)
nRBC: 0 % (ref 0.0–0.2)

## 2021-01-20 LAB — URINE CULTURE: Culture: >=100000 — AB

## 2021-01-20 LAB — BASIC METABOLIC PANEL
Anion gap: 11 (ref 5–15)
BUN: 42 mg/dL — ABNORMAL HIGH (ref 8–23)
CO2: 25 mmol/L (ref 22–32)
Calcium: 8.9 mg/dL (ref 8.9–10.3)
Chloride: 101 mmol/L (ref 98–111)
Creatinine, Ser: 1.1 mg/dL (ref 0.61–1.24)
GFR, Estimated: >60 mL/min (ref >60)
Glucose, Bld: 112 mg/dL — ABNORMAL HIGH (ref 70–99)
Potassium: 3.7 mmol/L (ref 3.5–5.1)
Sodium: 137 mmol/L (ref 135–145)

## 2021-01-20 MED ORDER — METOPROLOL SUCCINATE ER 50 MG PO TB24
50.0000 mg | ORAL_TABLET | Freq: Every day | ORAL | Status: DC
  Administered 2021-01-20: 50 mg via ORAL
  Filled 2021-01-20: qty 1

## 2021-01-20 MED ORDER — CEPHALEXIN 500 MG PO CAPS: 500.0000 mg | ORAL_CAPSULE | Freq: Two times a day (BID) | ORAL | 0 refills | Status: AC

## 2021-01-20 MED ORDER — APIXABAN 5 MG PO TABS: 5.0000 mg | ORAL_TABLET | Freq: Two times a day (BID) | ORAL | 3 refills | Status: AC

## 2021-01-20 MED ORDER — ASPIRIN 325 MG PO TBEC: 325.0000 mg | DELAYED_RELEASE_TABLET | Freq: Every day | ORAL | 0 refills | Status: AC

## 2021-01-20 MED ORDER — LOSARTAN POTASSIUM-HCTZ 100-12.5 MG PO TABS: 1.0000 | ORAL_TABLET | Freq: Every day | ORAL | Status: AC

## 2021-01-20 MED ORDER — CEPHALEXIN 500 MG PO CAPS
500.0000 mg | ORAL_CAPSULE | Freq: Two times a day (BID) | ORAL | Status: DC
  Administered 2021-01-20: 500 mg via ORAL
  Filled 2021-01-20: qty 1

## 2021-01-20 MED ORDER — APIXABAN 5 MG PO TABS: 5.0000 mg | ORAL_TABLET | Freq: Two times a day (BID) | ORAL | 3 refills | Status: DC

## 2021-01-20 MED ORDER — ATORVASTATIN CALCIUM 20 MG PO TABS: 20.0000 mg | ORAL_TABLET | Freq: Every day | ORAL | 3 refills | Status: AC

## 2021-01-20 NOTE — Progress Notes (Signed)
Stroke Team: BRIEF NOTE  We have made several attempts to reach out to pts wife to give update from stroke stand point. I have called [facility] mobil phone and home phone numbers again today. I left a brief message on VM.   I spoke with the primary team today and they plan to dc home today which is fine by [place]. Anticoagulation of any type for secondary stroke prevention due to hypercoagulability is recommended to start in ~1 week post stroke. From stroke stand point DOAC or full dose lovenox is ok, but will defer this decision to Hem/Onc. LE [place] was neg for DVT. There is no further stroke work up and out pt f/u should be with [facility] in 4-6 weeks.   Jamiria Langill Metzger-Cihelka, ARNP-C, ANVP-BC Pager: [phone]

## 2021-01-20 NOTE — TOC Progression Note (Signed)
Transition of Care [facility]) - Progression Note    Patient Details  Name: Timothy Barton MRN: [id number] Date of Birth: 1951-03-22  [facility] of [facility]) CM/SW Contact  Kaelie Henigan, Juliann Pulse, RN Phone Number: 01/20/2021, 2:34 PM  Clinical Narrative:  Recc HHPT/OT-Awaiting [place] agency to accept.     Expected Discharge Plan: [place] Barriers to Discharge: No Barriers Identified  Expected Discharge Plan and Services Expected Discharge Plan: [place] arrangements for the past 2 months: Single Family Home Expected Discharge Date: 01/20/21                                     Social Determinants of Health (SDOH) Interventions    Readmission Risk Interventions No flowsheet data found.

## 2021-01-20 NOTE — TOC Transition Note (Signed)
Transition of Care [facility]) - CM/SW Discharge Note   Patient Details  Name: Timothy Barton MRN: [id number] Date of Birth: Feb 16, 1951  Transition of Care [facility]) CM/SW Contact:  Dessa Phi, RN Phone Number: 01/20/2021, 4:44 PM   Clinical Narrative: Unable to get [place] agnecy;set up otpt PT-patient/spouse agree. MD notified. Referral to OP rehab [place] set. No further CM needs.      Final next level of care: OP Rehab Barriers to Discharge: No Barriers Identified   Patient Goals and CMS Choice Patient states their goals for this hospitalization and ongoing recovery are:: To get better CMS Medicare.gov Compare Post Acute Care list provided to:: Patient Choice offered to / list presented to : Spouse  Discharge Placement                       Discharge Plan and Services                                     Social Determinants of Health (SDOH) Interventions     Readmission Risk Interventions No flowsheet data found.

## 2021-01-20 NOTE — Progress Notes (Signed)
Spoke to case manager Juliann Pulse) at 1645 this after noon and was informed that she would notify us when a home health company had been secured as the patient was discharged without this being set up yet. At 1800 the patient's wife came outside of the room very upset as she states that she needs to get the patient home and she is not being informed about what is going on. Per the wife she states that Juliann Pulse called her and informed her that a home health option is not available to him in his area but there is an outpatient company in [place] that he can go to.  I was not informed of this by the Education officer, museum.   Then the wife became upset because she states that no one has talked to her about a walker or any other assistive equipment. Read the PT note which stated that they did use a walker when they ambulated him but that no assistive equipment was needed. Spoke to the charge nurse and the doctor to see if we could get this ordered tonight and delivered to the patient this evening or tomorrow but that was not possible . The patient would have to spend the night and our DME services could bring it to the room tomorrow. Also informed her that the outpatient PT company could also potentially supply him with one if they felt like it was needed. She stated " Don't worry about it, I will get him one. Let's go."  I took the patient's Ivs out as asked by the family, and he was escorted downstairs via wheelchair and was discharged into the care of self and wife.

## 2021-01-20 NOTE — Progress Notes (Signed)
Physical Therapy Treatment Patient Details Name: Timothy Barton MRN: [id number] DOB: 06/17/1951 Today's Date: 01/20/2021    History of Present Illness Timothy Barton is a 70 y.o. male presenting to the emergency room for evaluation of altered mental status. MRI imaging shows patchy and confluent acute to early subacute ischemic infarcts  involving the left cerebral hemisphere as above. Associated  petechial hemorrhage without frank intraparenchymal hematoma or  significant mass effect. and a Few additional subcentimeter acute to early subacute ischemic  infarcts involving the subcortical right frontal lobe and superior  left cerebellum. 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  Spouse stated "He can't sit too upright because of his belly" increased pain and increased difficulty breathing.  ABD girth is enlarged/tight.  Assisted with amb in hallway.  General transfer comment: pt uses forward momentum to rise (due to enlatrged ABD girth) "that's how he does at home" stated spouse.  Also he allows himself to "plop" uncontrolled in recliner.  General Gait Details: spouse assisted by following with recliner.  Used a walker for caution (first walk) and spouse reports he has "bad knees" but prior to admit did NOT use any AD.  Pt tolerated amb 50 feet demonstarting even step length and midline posture.  Slight forward flex using walker and large BOS.  Distance limited by fatigue/dyspnea/endurance.  Amb another 50 feet.  Steady.  No true LOB. Gait speed > normal.  "that's him" stated spouse. Spouse very pleased he was able to amb that much.  Did not require any physical assist, just MinGuard Assist for safety.   Follow Up  Recommendations  Home health PT;Supervision - Intermittent     Equipment Recommendations  None recommended by PT    Recommendations for Other Services       Precautions / Restrictions Precautions Precautions: Fall Precaution Comments: "bad knees"    Mobility  Bed Mobility               General bed mobility comments: OOB in recliner    Transfers Overall transfer level: Needs assistance Equipment used: None Transfers: Sit to/from [employer] Transfers Sit to Stand: Supervision Stand pivot transfers: Supervision       General transfer comment: pt uses forward momentum to rise (due to enlatrged ABD girth) "that's how he does at home" stated spouse.  Also he allows himself to "plop" uncontrolled in recliner.  Ambulation/Gait Ambulation/Gait assistance: Min guard;Min assist Gait Distance (Feet): 100 Feet (50 feet x 2 one seated rest break) Assistive device: Rolling walker (2 wheeled) Gait Pattern/deviations: Step-through pattern;Decreased stride length Gait velocity: too quick   General Gait Details: spouse assisted by following with recliner.  Used a walker for caution (first walk) and spouse reports he has "bad knees" but prior to admit did NOT use any AD.  Pt tolerated amb 50 feet demonstarting even step length and midline posture.  Slight forward flex using walker and large BOS.  Distance limited by fatigue/dyspnea/endurance.  Amb another 50 feet.  Steady.  No true LOB. Gait speed > normal.  "that's him" stated spouse.   Stairs             Wheelchair Mobility    Modified Rankin (Stroke Patients Only)       Balance  Cognition Arousal/Alertness: Awake/alert Behavior During Therapy: Flat affect Overall Cognitive Status: Impaired/Different from baseline Area of Impairment: Awareness;Problem solving;Following commands                 Orientation Level: Person;Place;Time Current  Attention Level: Sustained   Following Commands: Follows one step commands inconsistently Safety/Judgement: Decreased awareness of deficits     General Comments: pt present with some expressive aphagia with word find frustration.  Following commands >75%      Exercises      General Comments        Pertinent Vitals/Pain Pain Assessment: Faces Faces Pain Scale: Hurts little more Pain Location: ABD Pain Descriptors / Indicators: Aching;Tightness Pain Intervention(s): Monitored during session    Home Living                      Prior Function            PT Goals (current goals can now be found in the care plan section) Progress towards PT goals: Progressing toward goals    Frequency    Min 3X/week      PT Plan Current plan remains appropriate    Co-evaluation              AM-PAC PT "6 Clicks" Mobility   Outcome Measure  Help needed turning from your back to your side while in a flat bed without using bedrails?: A Little Help needed moving from lying on your back to sitting on the side of a flat bed without using bedrails?: A Little Help needed moving to and from a bed to a chair (including a wheelchair)?: A Little Help needed standing up from a chair using your arms (e.g., wheelchair or bedside chair)?: A Little Help needed to walk in hospital room?: A Little Help needed climbing 3-5 steps with a railing? : A Little 6 Click Score: 18    End of Session Equipment Utilized During Treatment: Gait belt Activity Tolerance: Patient tolerated treatment well Patient left: in chair;with call bell/phone within reach;with chair alarm set;with family/visitor present Nurse Communication: Mobility status PT Visit Diagnosis: Other symptoms and signs involving the nervous system (R29.898)     Time: 1140-1155 PT Time Calculation (min) (ACUTE ONLY): 15 min  Charges:  $Gait Training: 8-22 mins                     {Satina Jerrell  PTA Acute  Rehabilitation  [facility]      [phone] Office      [phone]

## 2021-01-20 NOTE — Progress Notes (Signed)
Bilateral lower extremity venous duplex completed. Refer to "CV Proc" under chart review to view preliminary results.  01/20/2021 10:06 AM Kelby Aline., MHA, RVT, RDCS, RDMS

## 2021-01-20 NOTE — Progress Notes (Signed)
Provider paged with update.   01/20/21 1300  Assess: MEWS Score  Temp 98.9 F (37.2 C)  BP 125/64  Pulse Rate (!) 104  ECG Heart Rate (!) 104  SpO2 91 %  O2 Device Room Air  Assess: MEWS Score  MEWS Temp 0  MEWS Systolic 0  MEWS Pulse 1  MEWS RR 1  MEWS LOC 0  MEWS Score 2  MEWS Score Color Yellow  Assess: SIRS CRITERIA  SIRS Temperature  0  SIRS Pulse 1  SIRS Respirations  1  SIRS WBC 0  SIRS Score Sum  2

## 2021-01-20 NOTE — Discharge Summary (Addendum)
Physician Discharge Summary   Patient ID: Timothy Barton MRN: [id number] DOB/AGE: Nov 22, 1950 70 y.o.  Admit date: 01/17/2021 Discharge date: 01/20/2021  Primary Care Physician:  Medicine, [facility]   Recommendations for Outpatient Follow-up:  Follow up with PCP in 1-2 weeks Please obtain BMP in one week  Per neurology recommendations, placed on aspirin 325 mg daily.  Start eliquis on 01/25/2021 and stop aspirin.  Home Health: Home health PT OT Equipment/Devices:   Discharge Condition: stable CODE STATUS: FULL  Diet recommendation: Mechanical soft diet   Discharge Diagnoses:     AMS (altered mental status)  Hepatocellular carcinoma ([place])  Acute CVA (cerebrovascular accident) ([place]) E. coli UTI Acute kidney injury Anemia with thrombocytopenia, chronic Abdominal pain likely due to mild focal ileus improved  Consults: Neurology Oncology, Dr. Burr Medico    Allergies:   Allergies  Allergen Reactions   Tape     Use paper tape only     DISCHARGE MEDICATIONS: Allergies as of 01/20/2021       Reactions   Tape    Use paper tape only        Medication List     STOP taking these medications    ibuprofen 200 MG tablet Commonly known as: ADVIL   meloxicam 15 MG tablet Commonly known as: MOBIC       TAKE these medications    apixaban 5 MG Tabs tablet Commonly known as: ELIQUIS Take 1 tablet (5 mg total) by mouth 2 (two) times daily. Start on 01/25/2021 and stop aspirin while on eliquis. Start taking on: January 25, 2022   aspirin 325 MG EC tablet Take 1 tablet (325 mg total) by mouth daily for 3 days. Stop on 01/24/2021 when started on eliquis. Start taking on: January 21, 2021 What changed:  medication strength how much to take additional instructions   atorvastatin 20 MG tablet Commonly known as: LIPITOR Take 1 tablet (20 mg total) by mouth daily. Start taking on: January 21, 2021   cephALEXin 500 MG capsule Commonly known as: KEFLEX Take 1 capsule  (500 mg total) by mouth 2 (two) times daily for 4 days.   Fish Oil 1360 MG Caps Take 2 capsules by mouth daily.   lidocaine-prilocaine cream Commonly known as: EMLA Apply 1 application topically as needed.   losartan-hydrochlorothiazide 100-12.5 MG tablet Commonly known as: HYZAAR Take 1 tablet by mouth daily. Hold for 3 days Start taking on: January 23, 2021 What changed:  additional instructions These instructions start on January 23, 2021. If you are unsure what to do until then, ask your doctor or other care provider.   metoprolol succinate 50 MG 24 hr tablet Commonly known as: TOPROL-XL Take 50 mg by mouth daily.   ondansetron 8 MG tablet Commonly known as: ZOFRAN Take 1 tablet (8 mg total) by mouth every 8 (eight) hours as needed for nausea or vomiting.   prochlorperazine 10 MG tablet Commonly known as: COMPAZINE Take 1 tablet (10 mg total) by mouth every 6 (six) hours as needed for nausea or vomiting.   traMADol 50 MG tablet Commonly known as: ULTRAM Take 1 tablet (50 mg total) by mouth every 6 (six) hours as needed. 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 Per wife, patient was alert and oriented to self only and a day before the admission he was in normal state of health.  He had gone to cancer center and drove himself to get a haircut.  However on the day of admission he woke up and began to say things that do not make sense.  Patient also reported split vision when he tried to see with both eyes, did not recognize his wife and could barely follow commands. No nausea vomiting or diarrhea, chronically has cough for the past month and a half.  No fevers. In ED, UA positive  for UTI.  Patient was started on IV Rocephin.  Ammonia level normal. Creatinine 1.68 from prior of 1.15. MRI brain showed acute to early subacute ischemic infarcts involving the left cerebral hemisphere, associated petechial hemorrhage without intraparenchymal hematoma or mass-effect.  Few additional subcentimeter acute to early subacute ischemic infarcts involving the subcortical right frontal lobe and superior left cerebellum.    Hospital Course:   Acute CVA (cerebrovascular accident) ([place]) -MRI of the brain showed acute to early subacute ischemic infarcts involving left cerebral hemisphere with petechial hemorrhage, early subacute ischemic infarcts involving subcortical right frontal lobe, superior left cerebellum -Neurology was consulted, initially recommended to hold aspirin for now due to petechial hemorrhage -MRA showed no proximal intracranial vessel occlusion or significant stenosis -Carotid Dopplers with 1 to 39% stenosis of right and left carotid artery -2D echo showed EF of 60 to 65%, G1 DD -LDL 73, d/w Dr Erlinda Hong, started on Lipitor 20 mg daily -Hemoglobin A1c 5.8 -SLP evaluation recommended dysphagia 3 diet, mechanical soft -PT evaluation recommended home health PT -Venous Dopplers lower extremity showed no DVT  -Started on aspirin 325 mg daily, continue till 01/24/2021.  Then start eliquis 5 mg twice daily and stop aspirin.  Per neurology start NOAC's early next week for stroke prevention in the setting of hypercoagulable state due to advanced malignancy.     Urinary tract infection with acute metabolic encephalopathy -Urine culture showed more than 100,000 colonies of E. 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 Pilar Plate following, recommended to avoid invasive work-up of stroke, overall guarded prognosis  from malignancy standpoint -Underwent CT-guided left paraspinal mass biopsy on 01/19/2021.  Oncology to follow results       AKI (acute kidney injury) ([place]) -Creatinine 1.68 on admission, was 1.1 on 7/20.  losartan HCTZ was placed on hold on admission. -Patient was placed on IV fluid hydration, creatinine improved to 1.1 at the time of discharge.       Anemia with Thrombocytopenia (H[facility]), chronic - PLT count improving, H&H currently close to baseline   Abdominal pain -Abdominal x-ray showed possible mild focal ileus. -Continue bowel regimen, patient states abdominal pain now improved     Obesity Estimated body mass index is 40.92 kg/m as calculated from the following:   Height as of this encounter: '5\' 9"'$  (1.753 m).   Weight as of this encounter: 125.7 kg.     Day of Discharge S: No acute complaints, hoping to be discharged today.  No fevers or chills, no headache, no focal neurological deficits.  Appears close to his baseline  BP 125/64 (BP Location: Left Arm)   Pulse (!) 104   Temp 98.9 F (37.2 C) (Oral)   Resp (!) 21  Ht '5\' 9"'$  (1.753 m)   Wt 125.7 kg   SpO2 91%   BMI 40.92 kg/m   Physical Exam: General: Alert and awake oriented x3 not in any acute distress. CVS: S1-S2 clear no murmur rubs or gallops Chest: clear to auscultation bilaterally, no wheezing rales or rhonchi Abdomen: soft nontender, nondistended, normal bowel sounds Extremities: no cyanosis, clubbing or edema noted bilaterally Neuro: Cranial nerves II-XII intact, no focal neurological deficits    Get Medicines reviewed and adjusted: Please take all your medications with you for your next visit with your Primary MD  Please request your Primary MD to go over all hospital tests and procedure/radiological results at the follow up. Please ask your Primary MD to get all Hospital records sent to his/her office.  If you experience worsening of your admission symptoms, develop shortness of breath, life  threatening emergency, suicidal or homicidal thoughts you must seek medical attention immediately by calling 911 or calling your MD immediately  if symptoms less severe.  You must read complete instructions/literature along with all the possible adverse reactions/side effects for all the Medicines you take and that have been prescribed to you. Take any new Medicines after you have completely understood and accept all the possible adverse reactions/side effects.   Do not drive when taking pain medications.   Do not take more than prescribed Pain, Sleep and Anxiety Medications  Special Instructions: If you have smoked or chewed Tobacco  in the last 2 yrs please stop smoking, stop any regular Alcohol  and or any Recreational drug use.  Wear Seat belts while driving.  Please note  You were cared for by a hospitalist during your hospital stay. Once you are discharged, your primary care physician will handle any further medical issues. Please note that NO REFILLS for any discharge medications will be authorized once you are discharged, as it is imperative that you return to your primary care physician (or establish a relationship with a primary care physician if you do not have one) for your aftercare needs so that they can reassess your need for medications and monitor your lab values.   The results of significant diagnostics from this hospitalization (including imaging, microbiology, ancillary and laboratory) are listed below for reference.      Procedures/Studies:  DG Chest 2 View  Result Date: 01/17/2021 CLINICAL DATA:  acute mental status change, cancer, r/o infection EXAM: CHEST - 2 VIEW COMPARISON:  12/13/2020 FINDINGS: Cardiomediastinal contours are within normal limits. Atherosclerotic calcification of the aortic knob. Small bilateral pleural effusions with associated hazy bibasilar opacities. No pneumothorax. IMPRESSION: Small bilateral pleural effusions with associated hazy bibasilar  opacities which may represent atelectasis versus infection. Electronically Signed   By: Davina Poke D.O.   On: 01/17/2021 14:57   MR ANGIO HEAD WO CONTRAST  Result Date: 01/18/2021 CLINICAL DATA:  Stroke, follow-up EXAM: MRA HEAD WITHOUT CONTRAST TECHNIQUE: Angiographic images of the Circle of Willis were acquired using MRA technique without intravenous contrast. COMPARISON:  No pertinent prior exam. FINDINGS: Intracranial internal carotid arteries are patent. Middle and anterior cerebral arteries are patent. Intracranial vertebral arteries, basilar artery, posterior cerebral arteries are patent. Bilateral posterior communicating arteries are present. There is no significant stenosis or aneurysm. IMPRESSION: No proximal intracranial vessel occlusion or significant stenosis. Electronically Signed   By: Macy Mis M.D.   On: 01/18/2021 08:07   MR BRAIN W WO CONTRAST  Result Date: 01/17/2021 CLINICAL DATA:  Initial evaluation for altered mental status, unknown cause. EXAM: MRI  HEAD WITHOUT AND WITH CONTRAST TECHNIQUE: Multiplanar, multiecho pulse sequences of the brain and surrounding structures were obtained without and with intravenous contrast. CONTRAST:  66m GADAVIST GADOBUTROL 1 MMOL/ML IV SOLN COMPARISON:  CT from 11/25/2020. FINDINGS: Brain: Generalized age-related cerebral atrophy. Scattered patchy T2/FLAIR hyperintensity within the periventricular deep white matter both cerebral hemispheres most consistent with chronic small vessel ischemic disease, mild in nature. Small remote right cerebellar infarct noted. Patchy and confluent restricted diffusion involving the cortical and subcortical aspects of the left frontal, parietal, occipital, and temporal lobes, consistent with acute to early subacute ischemic infarcts. Largest area of involvement present at the left temporal lobe and measures up to 6 cm in diameter. Associated petechial hemorrhage without frank intraparenchymal hematoma or  significant mass effect. Few additional subcentimeter acute to early subacute ischemic infarcts noted at the subcortical right frontal lobe (series 5, image 85) and superior left cerebellum (series 5, image 62). Gray-white matter differentiation otherwise maintained. No other evidence for acute or chronic intracranial hemorrhage. No mass lesion, midline shift, or significant mass effect. No hydrocephalus or extra-axial fluid collection. Pituitary gland suprasellar region within normal limits. No abnormal enhancement or evidence for intracranial metastatic disease. Vascular: Major intracranial vascular flow voids are maintained. Skull and upper cervical spine: Craniocervical junction within normal limits. Multilevel degenerative spondylosis noted within the upper cervical spine with no more than mild spinal stenosis at C3-4. Bone marrow signal intensity heterogeneous without focal marrow replacing lesion. No scalp soft tissue abnormality. Sinuses/Orbits: Globes and orbital soft tissues within normal limits. Paranasal sinuses are largely clear. No significant mastoid effusion. Inner ear structures grossly normal. Other: None. IMPRESSION: 1. Patchy and confluent acute to early subacute ischemic infarcts involving the left cerebral hemisphere as above. Associated petechial hemorrhage without frank intraparenchymal hematoma or significant mass effect. 2. Few additional subcentimeter acute to early subacute ischemic infarcts involving the subcortical right frontal lobe and superior left cerebellum. 3. No other acute intracranial abnormality. No evidence for intracranial metastatic disease. 4. Underlying age-related cerebral atrophy with mild chronic small vessel ischemic disease. Electronically Signed   By: BJeannine BogaM.D.   On: 01/17/2021 21:18   UKoreaAbdomen Limited  Result Date: 01/10/2021 CLINICAL DATA:  Liver lesions, cirrhosis EXAM: LIMITED ABDOMEN ULTRASOUND FOR ASCITES TECHNIQUE: Limited ultrasound  survey for ascites was performed in all four abdominal quadrants. COMPARISON:  MR 12/29/2020 FINDINGS: Scattered small-volume abdominal ascites as before. Right lobe liver mass as described on previous MR. IMPRESSION: Small volume abdominal ascites Electronically Signed   By: DLucrezia EuropeM.D.   On: 01/10/2021 15:03   DG Abd Portable 1V  Result Date: 01/19/2021 CLINICAL DATA:  Abdominal pain EXAM: PORTABLE ABDOMEN - 1 VIEW COMPARISON:  MR abdomen 12/29/2020 FINDINGS: Nonspecific bowel gas pattern. Air-filled loops of large and small bowel are identified within the abdomen. In the left lower quadrant there are a few prominent air-filled loops of small bowel measuring up to 2.7 cm. IMPRESSION: 1. No signs of high-grade bowel obstruction. 2. Mildly prominent air-filled loops of small bowel in the left lower quadrant which may reflect focal ileus. Electronically Signed   By: TKerby MoorsM.D.   On: 01/19/2021 12:57   DG Swallowing Func-Speech Pathology  Result Date: 01/19/2021 Formatting of this result is different from the original. Objective Swallowing Evaluation: Type of Study: MBS-Modified Barium Swallow Study  Patient Details Name: DJAYC GOLASZEWSKIMRN: [id number] of Birth: 316-Nov-1952Today's Date: 01/19/2021 Time: SLP Start Time (ACUTE ONLY): [id number] Stop Time (ACUTE  ONLY): 1335 SLP Time Calculation (min) (ACUTE ONLY): 30 min Past Medical History: Past Medical History: Diagnosis Date  A-fib ([place])   Arthritis   Hypertension   Osteoarthritis of right hip 07/05/2012  Sleep apnea  Past Surgical History: Past Surgical History: Procedure Laterality Date  arthrosopy    BACK SURGERY  2006   ruptured disc,and repair of leak x3  CARPAL TUNNEL RELEASE  2002  right and left  KNEE ARTHROSCOPY  2006  right  TOTAL HIP ARTHROPLASTY  07/03/2012  Procedure: TOTAL HIP ARTHROPLASTY;  Surgeon: Yvette Rack., MD;  Location: [place];  Service: Orthopedics;  Laterality: Right;  with autograft  TOTAL HIP ARTHROPLASTY Left  11/04/2014  Procedure: TOTAL HIP ARTHROPLASTY;  Surgeon: Earlie Server, MD;  Location: [place];  Service: Orthopedics;  Laterality: Left; HPI: Timothy Barton is a 70 y.o. male presenting to the emergency room for evaluation of altered mental status. MRI imaging shows patchy and confluent acute to early subacute ischemic infarcts  involving the left cerebral hemisphere as above. Associated  petechial hemorrhage without frank intraparenchymal hematoma or  significant mass effect. and a Few additional subcentimeter acute to early subacute ischemic  infarcts involving the subcortical right frontal lobe and superior  left cerebellum. 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  Subjective: pt awake in flouro chair Assessment / Plan / Recommendation CHL IP CLINICAL IMPRESSIONS 01/19/2021 Clinical Impression Patient presents with mild oropharyngeal dysphagia without aspiration of any consistency tested.  Mild decreased oral coordination results in occasional premature spillage, oral residual in posterior buccal region that patient transits and swallows.  Pt extended chin upward to aid oral transiting of barium tablet into pharynx but given this is precarious, recommend medicine with puree continue.  Pharyngeal swallow marked by decreased adequacy of laryngeal elevation/closure which allowed penetration of thin liquid *on 3 swallows of approximately 10.  In addition, swallow triggered at pyriform sinus x1 allowing laryngeal penetration.  Chin tuck posture did not prevent laryngeal penetration and actually increased its frequency.    When taking tablet, patient's mild laryngeal penetration (before the swallow as liquid spilled into larynx before swallow triggered) did elicit a cough response - but aspiration was not observed fortunately.  Mild amount of secretions noted in pharynx as well and appearance of osteophytes present that did not impair epiglottic deflection or barium transiting.  Pharyngeal swallow is strong without any residuals fortunately.  Please note patient was sitting fully upright for all po and given his decreased laryngeal closure- recommend he maintain this posture with all po intake.  Recommend continue current diet with precautions. Pt informed to recommendations using video feedback. SLP Visit Diagnosis Dysphagia, oropharyngeal phase (R13.12) Attention and concentration deficit following -- Frontal lobe and executive function deficit following -- Impact on safety and function Mild aspiration risk   CHL IP TREATMENT RECOMMENDATION 01/19/2021 Treatment Recommendations Therapy as outlined in treatment plan below   Prognosis 01/19/2021 Prognosis for Safe Diet Advancement Good Barriers to Reach Goals -- Barriers/Prognosis Comment -- CHL IP DIET RECOMMENDATION 01/19/2021 SLP Diet Recommendations Dysphagia 3 (Mech soft) solids;Thin liquid Liquid Administration via Cup;No straw Medication Administration Whole meds with puree Compensations Slow rate;Small sips/bites Postural Changes Remain semi-upright after after feeds/meals (Comment);Seated upright at 90 degrees   CHL IP OTHER RECOMMENDATIONS 01/19/2021 Recommended Consults -- Oral Care Recommendations Oral care BID Other Recommendations --   CHL IP FOLLOW UP  RECOMMENDATIONS 01/19/2021 Follow up Recommendations Home health SLP;Outpatient SLP   CHL IP FREQUENCY AND DURATION 01/19/2021 Speech Therapy Frequency (ACUTE ONLY) min 1 x/week Treatment Duration 2 weeks      CHL IP ORAL PHASE 01/19/2021 Oral Phase Impaired Oral - Pudding Teaspoon -- Oral - Pudding Cup -- Oral - Honey Teaspoon -- Oral - Honey Cup -- Oral - Nectar Teaspoon WFL Oral - Nectar Cup WFL;Right pocketing in lateral sulci;Left pocketing in lateral sulci;Other (Comment) Oral - Nectar Straw WFL Oral - Thin Teaspoon WFL Oral -  Thin Cup WFL Oral - Thin Straw WFL;Premature spillage Oral - Puree WFL Oral - Mech Soft Impaired mastication;Reduced posterior propulsion;Weak lingual manipulation;Premature spillage Oral - Regular -- Oral - Multi-Consistency Other (Comment) Oral - Pill WFL Oral Phase - Comment --  CHL IP PHARYNGEAL PHASE 01/19/2021 Pharyngeal Phase Impaired Pharyngeal- Pudding Teaspoon -- Pharyngeal -- Pharyngeal- Pudding Cup -- Pharyngeal -- Pharyngeal- Honey Teaspoon -- Pharyngeal -- Pharyngeal- Honey Cup -- Pharyngeal -- Pharyngeal- Nectar Teaspoon WFL Pharyngeal Material does not enter airway Pharyngeal- Nectar Cup [facility] Pharyngeal Material does not enter airway Pharyngeal- Nectar Straw Penetration/Aspiration during swallow Pharyngeal Material enters airway, remains ABOVE vocal cords then ejected out Pharyngeal- Thin Teaspoon WFL Pharyngeal Material does not enter airway Pharyngeal- Thin Cup Delayed swallow initiation-pyriform sinuses;Reduced airway/laryngeal closure;Reduced laryngeal elevation Pharyngeal Material enters airway, remains ABOVE vocal cords then ejected out Pharyngeal- Thin Straw Delayed swallow initiation-pyriform sinuses;Reduced laryngeal elevation;Reduced airway/laryngeal closure;Penetration/Aspiration during swallow Pharyngeal Material enters airway, remains ABOVE vocal cords then ejected out;Material enters airway, remains ABOVE vocal cords and not ejected out Pharyngeal- Puree WFL Pharyngeal Material does not enter airway Pharyngeal- Mechanical Soft WFL Pharyngeal Material does not enter airway Pharyngeal- Regular -- Pharyngeal -- Pharyngeal- Multi-consistency Penetration/Aspiration before swallow;Reduced airway/laryngeal closure;Reduced laryngeal elevation Pharyngeal Material enters airway, remains ABOVE vocal cords and not ejected out Pharyngeal- Pill [facility] Pharyngeal Material does not enter airway Pharyngeal Comment --  CHL IP CERVICAL ESOPHAGEAL PHASE 01/19/2021 Cervical Esophageal Phase WFL Pudding Teaspoon  -- Pudding Cup -- Honey Teaspoon -- Honey Cup -- Nectar Teaspoon -- Nectar Cup -- Nectar Straw -- Thin Teaspoon -- Thin Cup -- Thin Straw -- Puree -- Mechanical Soft -- Regular -- Multi-consistency -- Pill -- Cervical Esophageal Comment -- Kathleen Lime, MS [facility] SLP Acute Rehab Services Office [phone] Pager [phone] Macario Golds 01/19/2021, 4:03 PM              ECHOCARDIOGRAM COMPLETE  Result Date: 01/19/2021    ECHOCARDIOGRAM REPORT   Patient Name:   Timothy Barton Date of Exam: 01/19/2021 Medical Rec #:  [id number]        Height:       69.0 in Accession #:    [id number]       Weight:       277.1 lb Date of Birth:  February 14, 1951        BSA:          2.373 m Patient Age:    40 years         BP:           138/73 mmHg Patient Gender: M                HR:           94 bpm. Exam Location:  Inpatient Procedure: 2D Echo, Cardiac Doppler and Color Doppler Indications:    Stroke  History:        Patient has no prior history of Echocardiogram examinations.  Arrythmias:Atrial Fibrillation; Risk Factors:Hypertension.  Sonographer:    Bernadene Person RDCS Referring Phys: [id number] [place] T TU  Sonographer Comments: Technically difficult study due to poor echo windows. Image acquisition challenging due to respiratory motion and Image acquisition challenging due to patient body habitus. Pt unable to turn on his side. IMPRESSIONS  1. Left ventricular ejection fraction, by estimation, is 60 to 65%. Left ventricular ejection fraction by PLAX is 66 %. The left ventricle has normal function. The left ventricle has no regional wall motion abnormalities. Left ventricular diastolic parameters are consistent with Grade I diastolic dysfunction (impaired relaxation). Elevated left ventricular end-diastolic pressure.  2. Right ventricular systolic function is normal. The right ventricular size is normal. There is normal pulmonary artery systolic pressure.  3. The mitral valve is normal in structure. No evidence of mitral  valve regurgitation. No evidence of mitral stenosis.  4. The aortic valve is normal in structure. There is mild calcification of the aortic valve. Aortic valve regurgitation is not visualized. No aortic stenosis is present.  5. Aortic dilatation noted. There is mild dilatation of the aortic root, measuring 37 mm. There is mild dilatation of the ascending aorta, measuring 40 mm.  6. The inferior vena cava is normal in size with greater than 50% respiratory variability, suggesting right atrial pressure of 3 mmHg. FINDINGS  Left Ventricle: Left ventricular ejection fraction, by estimation, is 60 to 65%. Left ventricular ejection fraction by PLAX is 66 %. The left ventricle has normal function. The left ventricle has no regional wall motion abnormalities. The left ventricular internal cavity size was normal in size. There is no left ventricular hypertrophy. Left ventricular diastolic parameters are consistent with Grade I diastolic dysfunction (impaired relaxation). Elevated left ventricular end-diastolic pressure. Right Ventricle: The right ventricular size is normal. No increase in right ventricular wall thickness. Right ventricular systolic function is normal. There is normal pulmonary artery systolic pressure. The tricuspid regurgitant velocity is 1.94 m/s, and  with an assumed right atrial pressure of 3 mmHg, the estimated right ventricular systolic pressure is [id number] mmHg. Left Atrium: Left atrial size was normal in size. Right Atrium: Right atrial size was normal in size. Pericardium: There is no evidence of pericardial effusion. Mitral Valve: The mitral valve is normal in structure. No evidence of mitral valve regurgitation. No evidence of mitral valve stenosis. Tricuspid Valve: The tricuspid valve is normal in structure. Tricuspid valve regurgitation is trivial. No evidence of tricuspid stenosis. Aortic Valve: The aortic valve is normal in structure. There is mild calcification of the aortic valve. Aortic valve  regurgitation is not visualized. No aortic stenosis is present. Pulmonic Valve: The pulmonic valve was normal in structure. Pulmonic valve regurgitation is not visualized. No evidence of pulmonic stenosis. Aorta: Aortic dilatation noted. There is mild dilatation of the aortic root, measuring 37 mm. There is mild dilatation of the ascending aorta, measuring 40 mm. Venous: The inferior vena cava is normal in size with greater than 50% respiratory variability, suggesting right atrial pressure of 3 mmHg. IAS/Shunts: No atrial level shunt detected by color flow Doppler.  LEFT VENTRICLE PLAX 2D LV EF:         Left            Diastology                ventricular     LV e' medial:    5.87 cm/s                ejection  LV E/e' medial:  10.6                fraction by     LV e' lateral:   10.70 cm/s                PLAX is 66      LV E/e' lateral: 5.8                %. LVIDd:         5.40 cm LVIDs:         3.40 cm LV PW:         1.00 cm LV IVS:        0.80 cm LVOT diam:     2.30 cm LVOT Area:     4.15 cm  RIGHT VENTRICLE RV S prime:     17.00 cm/s TAPSE (M-mode): 1.9 cm LEFT ATRIUM           Index LA diam:      3.70 cm 1.56 cm/m LA Vol (A4C): 42.1 ml 17.74 ml/m   AORTA Ao Root diam: 3.70 cm Ao Asc diam:  4.00 cm MITRAL VALVE               TRICUSPID VALVE MV Area (PHT): 3.02 cm    TR Peak grad:   15.1 mmHg MV Decel Time: 251 msec    TR Vmax:        194.00 cm/s MV E velocity: 62.40 cm/s MV A velocity: 82.60 cm/s  SHUNTS MV E/A ratio:  0.76        Systemic Diam: 2.30 cm Skeet Latch MD Electronically signed by Skeet Latch MD Signature Date/Time: 01/19/2021/12:27:50 PM    Final    VAS US CAROTID (at [facility] and [facility] only)  Result Date: 01/18/2021 Carotid Arterial Duplex Study Patient Name:  Timothy Barton  Date of Exam:   01/18/2021 Medical Rec #: [id number]         Accession #:    [id number] Date of Birth: [birth year]         Patient Gender: M Patient Age:   70Y Exam Location:  [facility] Procedure:       VAS US CAROTID Referring Phys: [id number] [place] T TU --------------------------------------------------------------------------------  Indications:       CVA. Risk Factors:      Hypertension. Comparison Study:  no prior Performing Technologist: Archie Patten RVS  Examination Guidelines: A complete evaluation includes B-mode imaging, spectral Doppler, color Doppler, and power Doppler as needed of all accessible portions of each vessel. Bilateral testing is considered an integral part of a complete examination. Limited examinations for reoccurring indications may be performed as noted.  Right Carotid Findings: +----------+--------+--------+--------+-------------------------+---------+           PSV cm/sEDV cm/sStenosisPlaque Description       Comments  +----------+--------+--------+--------+-------------------------+---------+ CCA Prox  112     16              heterogenous                       +----------+--------+--------+--------+-------------------------+---------+ CCA Distal93      24              heterogenous and calcificShadowing +----------+--------+--------+--------+-------------------------+---------+ ICA Prox  87      22      1-39%   heterogenous                       +----------+--------+--------+--------+-------------------------+---------+  ICA Distal105     24                                                 +----------+--------+--------+--------+-------------------------+---------+ ECA       119     14                                                 +----------+--------+--------+--------+-------------------------+---------+ +----------+--------+-------+--------+-------------------+           PSV cm/sEDV cmsDescribeArm Pressure (mmHG) +----------+--------+-------+--------+-------------------+ [id number]                                         +----------+--------+-------+--------+-------------------+ +---------+--------+--+--------+--+---------+  VertebralPSV cm/s51EDV cm/s10Antegrade +---------+--------+--+--------+--+---------+  Left Carotid Findings: +----------+--------+--------+--------+------------------+--------+           PSV cm/sEDV cm/sStenosisPlaque DescriptionComments +----------+--------+--------+--------+------------------+--------+ CCA Prox  93      17              heterogenous               +----------+--------+--------+--------+------------------+--------+ CCA Distal83      18              heterogenous               +----------+--------+--------+--------+------------------+--------+ ICA Prox  85      30      1-39%   heterogenous               +----------+--------+--------+--------+------------------+--------+ ICA Distal79      24                                         +----------+--------+--------+--------+------------------+--------+ ECA       101     9                                          +----------+--------+--------+--------+------------------+--------+ +----------+--------+--------+--------+-------------------+           PSV cm/sEDV cm/sDescribeArm Pressure (mmHG) +----------+--------+--------+--------+-------------------+ [id number]                                          +----------+--------+--------+--------+-------------------+ +---------+--------+--+--------+-+---------+ VertebralPSV cm/s36EDV cm/s9Antegrade +---------+--------+--+--------+-+---------+   Summary: Right Carotid: Velocities in the right ICA are consistent with a 1-39% stenosis. Left Carotid: Velocities in the left ICA are consistent with a 1-39% stenosis. Vertebrals: Bilateral vertebral arteries demonstrate antegrade flow. *See table(s) above for measurements and observations.     Preliminary    VAS [place] LOWER EXTREMITY VENOUS (DVT)  Result Date: 01/20/2021  Lower Venous DVT Study Patient Name:  Timothy Barton  Date of Exam:   01/20/2021 Medical Rec #: [id number]         Accession #:     [id number] Date of Birth: Nov 09, 1950         Patient Gender: M Patient Age:   [phone]  Exam Location:  [facility] Procedure:      VAS [place] LOWER EXTREMITY VENOUS (DVT) Referring Phys: [id number] W[address]law[address] Disbro K Kailea Dannemiller --------------------------------------------------------------------------------  Indications: Stroke, and Edema.  Comparison Study: No prior study Performing Technologist: Maudry Mayhew MHA, RDMS, RVT, RDCS  Examination Guidelines: A complete evaluation includes B-mode imaging, spectral Doppler, color Doppler, and power Doppler as needed of all accessible portions of each vessel. Bilateral testing is considered an integral part of a complete examination. Limited examinations for reoccurring indications may be performed as noted. The reflux portion of the exam is performed with the patient in reverse Trendelenburg.  +---------+---------------+---------+-----------+----------+--------------+ RIGHT    CompressibilityPhasicitySpontaneityPropertiesThrombus Aging +---------+---------------+---------+-----------+----------+--------------+ CFV      Full           Yes      Yes                                 +---------+---------------+---------+-----------+----------+--------------+ SFJ      Full                                                        +---------+---------------+---------+-----------+----------+--------------+ FV Prox  Full                                                        +---------+---------------+---------+-----------+----------+--------------+ FV Mid   Full                                                        +---------+---------------+---------+-----------+----------+--------------+ FV DistalFull                                                        +---------+---------------+---------+-----------+----------+--------------+ PFV      Full                                                         +---------+---------------+---------+-----------+----------+--------------+ POP      Full           Yes      Yes                                 +---------+---------------+---------+-----------+----------+--------------+ PTV      Full                                                        +---------+---------------+---------+-----------+----------+--------------+ PERO  Full                                                        +---------+---------------+---------+-----------+----------+--------------+   Right Technical Findings: Not visualized segments include Limited evalutation of peroneal veins.  +---------+---------------+---------+-----------+----------+--------------+ LEFT     CompressibilityPhasicitySpontaneityPropertiesThrombus Aging +---------+---------------+---------+-----------+----------+--------------+ CFV      Full           Yes      Yes                                 +---------+---------------+---------+-----------+----------+--------------+ SFJ      Full                                                        +---------+---------------+---------+-----------+----------+--------------+ FV Prox  Full                                                        +---------+---------------+---------+-----------+----------+--------------+ FV Mid   Full                                                        +---------+---------------+---------+-----------+----------+--------------+ FV DistalFull                                                        +---------+---------------+---------+-----------+----------+--------------+ PFV      Full                                                        +---------+---------------+---------+-----------+----------+--------------+ POP      Full           Yes      Yes                                 +---------+---------------+---------+-----------+----------+--------------+ PTV      Full                                                         +---------+---------------+---------+-----------+----------+--------------+ PERO     Full                                                        +---------+---------------+---------+-----------+----------+--------------+  Summary: RIGHT: - There is no evidence of deep vein thrombosis in the lower extremity. However, portions of this examination were limited- see technologist comments above.  - A complex, avascular cystic structure is found in the popliteal fossa.  LEFT: - There is no evidence of deep vein thrombosis in the lower extremity. However, portions of this examination were limited- see technologist comments above.  - A vascularized, complex cystic structure with internal debris and septations is found in the popliteal fossa. Etiology is unknown, further imaging may be warranted if clinically indicated.  *See table(s) above for measurements and observations.    Preliminary    CT OUTSIDE FILMS CHEST  Result Date: 01/09/2021 This examination belongs to an outside facility and is stored here for comparison purposes only.  Contact the originating outside institution for any associated report or interpretation.     LAB RESULTS: Basic Metabolic Panel: Recent Labs  Lab 01/19/21 0712 01/20/21 0439  NA 137 137  K 3.6 3.7  CL 103 101  CO2 24 25  GLUCOSE 110* 112*  BUN 43* 42*  CREATININE 1.25* 1.10  CALCIUM 8.7* 8.9   Liver Function Tests: Recent Labs  Lab 01/17/21 1140  AST 21  ALT 10  ALKPHOS 72  BILITOT 0.7  PROT 6.5  ALBUMIN 2.6*   No results for input(s): LIPASE, AMYLASE in the last 168 hours. Recent Labs  Lab 01/17/21 1139  AMMONIA 24   CBC: Recent Labs  Lab 01/17/21 1140 01/20/21 0439  WBC 6.7 8.8  NEUTROABS 5.3  --   HGB 10.4* 10.5*  HCT 31.8* 32.5*  MCV 88.1 89.8  PLT 117* 111*   Cardiac Enzymes: No results for input(s): CKTOTAL, CKMB, CKMBINDEX, TROPONINI in the last 168 hours. BNP: Invalid  input(s): POCBNP CBG: No results for input(s): GLUCAP in the last 168 hours.     Disposition and Follow-up: Discharge Instructions     Ambulatory referral to Neurology   Complete by: As directed    An appointment is requested in approximately: 2 weeks. Acute CVA   Diet - low sodium heart healthy   Complete by: As directed    Discharge instructions   Complete by: As directed    Take aspirin 325 mg daily till 01/24/2021.  Start Eliquis 5 mg twice a day on 01/25/2021 and stop aspirin.   Increase activity slowly   Complete by: As directed         DISPOSITION: Home with home health   [facility]     Truitt Merle, MD. Schedule an appointment as soon as possible for a visit in 2 week(s).   Specialties: Hematology, Oncology Contact information: [place] [address] [phone]         [facility]. Schedule an appointment as soon as possible for a visit in 2 week(s).   Specialty: Neurology Why: for hospital follow-up for stroke Contact information: [address] [employer] [place] [phone]        Medicine, [facility]. Schedule an appointment as soon as possible for a visit in 2 week(s).   Specialty: Internal Medicine Contact information: [address] [place] [address] [phone]                  Time coordinating discharge:  40 minutes  Signed:   Estill Cotta M.D. [facility] 01/20/2021, 1:43 PM

## 2021-01-22 ENCOUNTER — Inpatient Hospital Stay: Payer: Medicare Other | Admitting: Hematology

## 2021-01-22 ENCOUNTER — Inpatient Hospital Stay: Payer: Medicare Other

## 2021-01-23 ENCOUNTER — Telehealth: Payer: Self-pay | Admitting: Hematology

## 2021-01-23 DIAGNOSIS — C220 Liver cell carcinoma: Secondary | ICD-10-CM | POA: Diagnosis not present

## 2021-01-23 DIAGNOSIS — C7951 Secondary malignant neoplasm of bone: Secondary | ICD-10-CM | POA: Diagnosis not present

## 2021-01-23 DIAGNOSIS — J449 Chronic obstructive pulmonary disease, unspecified: Secondary | ICD-10-CM | POA: Diagnosis not present

## 2021-01-23 DIAGNOSIS — K7460 Unspecified cirrhosis of liver: Secondary | ICD-10-CM | POA: Diagnosis not present

## 2021-01-23 DIAGNOSIS — Z299 Encounter for prophylactic measures, unspecified: Secondary | ICD-10-CM | POA: Diagnosis not present

## 2021-01-23 NOTE — Telephone Encounter (Signed)
Scheduled appt per 8/2 sch msg. Pt's wife is aware.

## 2021-01-25 ENCOUNTER — Inpatient Hospital Stay (HOSPITAL_BASED_OUTPATIENT_CLINIC_OR_DEPARTMENT_OTHER): Payer: Medicare Other | Admitting: Hematology

## 2021-01-25 ENCOUNTER — Other Ambulatory Visit: Payer: Self-pay | Admitting: Hematology

## 2021-01-25 ENCOUNTER — Encounter: Payer: Self-pay | Admitting: Hematology

## 2021-01-25 ENCOUNTER — Other Ambulatory Visit: Payer: Self-pay

## 2021-01-25 ENCOUNTER — Inpatient Hospital Stay: Payer: Medicare Other | Attending: Hematology

## 2021-01-25 VITALS — BP 79/51 | HR 87 | Temp 97.9°F | Resp 20 | Ht 69.0 in

## 2021-01-25 DIAGNOSIS — Z79899 Other long term (current) drug therapy: Secondary | ICD-10-CM | POA: Diagnosis not present

## 2021-01-25 DIAGNOSIS — C787 Secondary malignant neoplasm of liver and intrahepatic bile duct: Secondary | ICD-10-CM | POA: Insufficient documentation

## 2021-01-25 DIAGNOSIS — C801 Malignant (primary) neoplasm, unspecified: Secondary | ICD-10-CM | POA: Insufficient documentation

## 2021-01-25 DIAGNOSIS — G4730 Sleep apnea, unspecified: Secondary | ICD-10-CM | POA: Diagnosis not present

## 2021-01-25 DIAGNOSIS — E279 Disorder of adrenal gland, unspecified: Secondary | ICD-10-CM | POA: Insufficient documentation

## 2021-01-25 DIAGNOSIS — Z7982 Long term (current) use of aspirin: Secondary | ICD-10-CM | POA: Diagnosis not present

## 2021-01-25 DIAGNOSIS — I639 Cerebral infarction, unspecified: Secondary | ICD-10-CM | POA: Diagnosis not present

## 2021-01-25 DIAGNOSIS — C786 Secondary malignant neoplasm of retroperitoneum and peritoneum: Secondary | ICD-10-CM | POA: Insufficient documentation

## 2021-01-25 DIAGNOSIS — I10 Essential (primary) hypertension: Secondary | ICD-10-CM | POA: Diagnosis not present

## 2021-01-25 DIAGNOSIS — C7989 Secondary malignant neoplasm of other specified sites: Secondary | ICD-10-CM | POA: Insufficient documentation

## 2021-01-25 DIAGNOSIS — Z7901 Long term (current) use of anticoagulants: Secondary | ICD-10-CM | POA: Insufficient documentation

## 2021-01-25 DIAGNOSIS — R160 Hepatomegaly, not elsewhere classified: Secondary | ICD-10-CM

## 2021-01-25 DIAGNOSIS — I4891 Unspecified atrial fibrillation: Secondary | ICD-10-CM | POA: Insufficient documentation

## 2021-01-25 LAB — CBC WITH DIFFERENTIAL (CANCER CENTER ONLY)
Abs Immature Granulocytes: 0.15 10*3/uL — ABNORMAL HIGH (ref 0.00–0.07)
Basophils Absolute: 0 10*3/uL (ref 0.0–0.1)
Basophils Relative: 0 %
Eosinophils Absolute: 0.1 10*3/uL (ref 0.0–0.5)
Eosinophils Relative: 1 %
HCT: 31.8 % — ABNORMAL LOW (ref 39.0–52.0)
Hemoglobin: 10.6 g/dL — ABNORMAL LOW (ref 13.0–17.0)
Immature Granulocytes: 1 %
Lymphocytes Relative: 5 %
Lymphs Abs: 0.8 10*3/uL (ref 0.7–4.0)
MCH: 28.9 pg (ref 26.0–34.0)
MCHC: 33.3 g/dL (ref 30.0–36.0)
MCV: 86.6 fL (ref 80.0–100.0)
Monocytes Absolute: 0.8 10*3/uL (ref 0.1–1.0)
Monocytes Relative: 5 %
Neutro Abs: 14 10*3/uL — ABNORMAL HIGH (ref 1.7–7.7)
Neutrophils Relative %: 88 %
Platelet Count: 156 10*3/uL (ref 150–400)
RBC: 3.67 MIL/uL — ABNORMAL LOW (ref 4.22–5.81)
RDW: 15.5 % (ref 11.5–15.5)
WBC Count: 15.9 10*3/uL — ABNORMAL HIGH (ref 4.0–10.5)
nRBC: 0 % (ref 0.0–0.2)

## 2021-01-25 LAB — CMP (CANCER CENTER ONLY)
ALT: 11 U/L (ref 0–44)
AST: 39 U/L (ref 15–41)
Albumin: 2.4 g/dL — ABNORMAL LOW (ref 3.5–5.0)
Alkaline Phosphatase: 79 U/L (ref 38–126)
Anion gap: 14 (ref 5–15)
BUN: 56 mg/dL — ABNORMAL HIGH (ref 8–23)
CO2: 21 mmol/L — ABNORMAL LOW (ref 22–32)
Calcium: 9 mg/dL (ref 8.9–10.3)
Chloride: 99 mmol/L (ref 98–111)
Creatinine: 2.31 mg/dL — ABNORMAL HIGH (ref 0.61–1.24)
GFR, Estimated: 30 mL/min — ABNORMAL LOW (ref >60)
Glucose, Bld: 82 mg/dL (ref 70–99)
Potassium: 4.7 mmol/L (ref 3.5–5.1)
Sodium: 134 mmol/L — ABNORMAL LOW (ref 135–145)
Total Bilirubin: 1 mg/dL (ref 0.3–1.2)
Total Protein: 6.2 g/dL — ABNORMAL LOW (ref 6.5–8.1)

## 2021-01-25 LAB — SURGICAL PATHOLOGY

## 2021-01-25 NOTE — Progress Notes (Signed)
DISCONTINUE OFF PATHWAY REGIMEN - Other   OFF12406:Atezolizumab 1,200 mg IV D1 + Bevacizumab 15 mg/kg IV D1 q21 Days:   A cycle is every 21 Days:     Atezolizumab      Bevacizumab-xxxx   **Always confirm dose/schedule in your pharmacy ordering system**  REASON: Other Reason PRIOR TREATMENT: Atezolizumab 1,200 mg IV D1 + Bevacizumab 15 mg/kg IV D1 q21 Days TREATMENT RESPONSE: Unable to Evaluate  START OFF PATHWAY REGIMEN - Other   [id number]:Atezolizumab D1 + Carboplatin D1 + Etoposide  IV D1-3 q21 Days x 4 Cycles Followed by Huey Bienenstock D1 q21 Days:   Cycles 1 through 4, every 21 days:     Atezolizumab      Carboplatin      Etoposide    Cycles 5 and beyond, every 21 days:     Atezolizumab   **Always confirm dose/schedule in your pharmacy ordering system**  Patient Characteristics: Intent of Therapy: Non-Curative / Palliative Intent, Discussed with Patient

## 2021-01-25 NOTE — Progress Notes (Signed)
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 Right hepatic lobe mass is worrisome for hepatocellular carcinoma. Bilateral adrenal masses and upper abdominal/periesophageal adenopathy are indicative of metastatic disease. Further evaluation with MR abdomen without and with contrast is suggested, as clinically indicated. Small bilateral pleural effusions, right greater than left Small ascites Ascending aortic aneurysm.   12/29/2020 Imaging   MRI Abdomen  IMPRESSION: Peripheral enhancing lesions within the liver consistent with multifocal hepatocellular carcinoma versus hepatic metastasis. Recommend ultrasound-guided percutaneous sampling Extensive retroperitoneal, porta hepatis, peripancreatic, and paraesophageal metastatic adenopathy. Soft tissue metastasis to the left paraspinal musculature. Metastatic lesion could also be a biopsy target. Morphologic changes consistent with cirrhosis and evidence of mild portal hypertension. No ascites. Bilateral small pleural effusions.   01/10/2021 Imaging   US Abdomen  IMPRESSION: Small volume ascites.   01/10/2021 Tumor Marker   AFP - 1.1 (normal)   01/11/2021 Initial Diagnosis   Hepatocellular carcinoma ([place])    01/19/2021 Cancer Staging   Staging form: Liver, AJCC 8th Edition - Clinical stage from 01/19/2021: Stage IVB (cTX, cNX, pM1) - Signed by Truitt Merle, MD on 01/25/2021  Stage prefix: Initial diagnosis    01/19/2021 Pathology  Results   FINAL MICROSCOPIC DIAGNOSIS:   A. LEFT PARASPINAL MASS, BIOPSY:  - Poorly differentiated carcinoma, see comment.   COMMENT:  Immunohistochemistry is positive for CD56 and synaptophysin. There is weak (non-specific) staining with Hepar1 and glypican3. The tumor is negative for pancytokeratin, cytokeratin 7, cytokeratin 20, cytokeratin903, GATA3, PSA, Prostein, MOC31, TTF-1, CDX-2, S100, CD117, and cytokeratin 5/6. The morphology and immunohistochemistry are most consistent with small cell carcinoma.   01/29/2021 -  Chemotherapy    Patient is on Treatment Plan: LUNG SCLC CARBOPLATIN + ETOPOSIDE + ATEZOLIZUMAB INDUCTION Q21D / ATEZOLIZUMAB MAINTENANCE Q21D          CURRENT THERAPY:  Supportive care   INTERVAL HISTORY:  Timothy Barton is here for a follow up after his recent hospitalization for stroke, and to discuss his biopsy results. He was last seen by me in the office on 01/10/21 in consultation with NP Lacie. She presents to the clinic accompanied by his wife. Recall he was admitted on 01/17/21 for stroke and a biopsy was done during his hospital stay.  Per his wife, he has not been eating or drinking. He has been in the recliner the majority of the time. She notes he only understands some things when spoken to. His speech is vocally clear but his words are gibberish. He keeps his eye closed most of the visit due to double vision. His wife also notes he has increased anxiety, cannot handle light, sounds, etc.   All other systems were reviewed with the patient and are negative.  MEDICAL HISTORY:  Past Medical History:  Diagnosis Date   A-fib [facility])    Arthritis    Hypertension    Osteoarthritis of right hip 07/05/2012   Sleep apnea     SURGICAL HISTORY: Past Surgical History:  Procedure Laterality Date   arthrosopy  BACK SURGERY  2006    ruptured disc,and repair of leak x3   CARPAL TUNNEL RELEASE  2002   right and left   KNEE ARTHROSCOPY  2006   right   TOTAL  HIP ARTHROPLASTY  07/03/2012   Procedure: TOTAL HIP ARTHROPLASTY;  Surgeon: Yvette Rack., MD;  Location: [facility];  Service: Orthopedics;  Laterality: Right;  with autograft   TOTAL HIP ARTHROPLASTY Left 11/04/2014   Procedure: TOTAL HIP ARTHROPLASTY;  Surgeon: Earlie Server, MD;  Location: [place];  Service: Orthopedics;  Laterality: Left;    I have reviewed the social history and family history with the patient and they are unchanged from previous note.  ALLERGIES:  is allergic to tape.  MEDICATIONS:  Current Outpatient Medications  Medication Sig Dispense Refill   [START ON 01/25/2022] apixaban (ELIQUIS) 5 MG TABS tablet Take 1 tablet (5 mg total) by mouth 2 (two) times daily. Start on 01/25/2021 and stop aspirin while on eliquis. 60 tablet 3   atorvastatin (LIPITOR) 20 MG tablet Take 1 tablet (20 mg total) by mouth daily. 30 tablet 3   lidocaine-prilocaine (EMLA) cream Apply 1 application topically as needed. 30 g 1   losartan-hydrochlorothiazide (HYZAAR) 100-12.5 MG tablet Take 1 tablet by mouth daily. Hold for 3 days     metoprolol succinate (TOPROL-XL) 50 MG 24 hr tablet Take 50 mg by mouth daily.     Omega-3 Fatty Acids (FISH OIL) 1360 MG CAPS Take 2 capsules by mouth daily.      ondansetron (ZOFRAN) 8 MG tablet Take 1 tablet (8 mg total) by mouth every 8 (eight) hours as needed for nausea or vomiting. 20 tablet 1   prochlorperazine (COMPAZINE) 10 MG tablet Take 1 tablet (10 mg total) by mouth every 6 (six) hours as needed for nausea or vomiting. 30 tablet 1   traMADol (ULTRAM) 50 MG tablet Take 1 tablet (50 mg total) by mouth every 6 (six) hours as needed. 30 tablet 0   No current facility-administered medications for this visit.    PHYSICAL EXAMINATION: ECOG PERFORMANCE STATUS: 4 - Bedbound  Vitals:   01/25/21 1602  BP: (!) 79/51  Pulse: 87  Resp: 20  Temp: 97.9 F (36.6 C)  SpO2: 92%   Wt Readings from Last 3 Encounters:  01/18/21 277 lb 1.9 oz (125.7 kg)  01/17/21 274 lb  9.6 oz (124.6 kg)  01/10/21 266 lb 5 oz (120.8 kg)    Patient is very lethargic, in wheelchair, does open his eyes, but is still aphasic and does not answer all questions appropriately Bilateral lung sounds clear Abdomen is very distended, firm, nontender Bilateral lower extremity edema up to thigh   LABORATORY DATA:  I have reviewed the data as listed CBC Latest Ref Rng & Units 01/25/2021 01/20/2021 01/17/2021  WBC 4.0 - 10.5 K/uL 15.9(H) 8.8 6.7  Hemoglobin 13.0 - 17.0 g/dL 10.6(L) 10.5(L) 10.4(L)  Hematocrit 39.0 - 52.0 % 31.8(L) 32.5(L) 31.8(L)  Platelets 150 - 400 K/uL 156 111(L) 117(L)     CMP Latest Ref Rng & Units 01/25/2021 01/20/2021 01/19/2021  Glucose 70 - 99 mg/dL 82 112(H) 110(H)  BUN 8 - 23 mg/dL 56(H) 42(H) 43(H)  Creatinine 0.61 - 1.24 mg/dL 2.31(H) 1.10 1.25(H)  Sodium 135 - 145 mmol/L 134(L) 137 137  Potassium 3.5 - 5.1 mmol/L 4.7 3.7 3.6  Chloride 98 - 111 mmol/L 99 101 103  CO2 22 - 32 mmol/L 21(L) 25 24  Calcium 8.9 - 10.3 mg/dL 9.0 8.9  8.7(L)  Total Protein 6.5 - 8.1 g/dL 6.2(L) - -  Total Bilirubin 0.3 - 1.2 mg/dL 1.0 - -  Alkaline Phos 38 - 126 U/L 79 - -  AST 15 - 41 U/L 39 - -  ALT 0 - 44 U/L 11 - -      RADIOGRAPHIC STUDIES: I have personally reviewed the radiological images as listed and agreed with the findings in the report. No results found.   ASSESSMENT & PLAN:  CEFERINO LANG is a 70 y.o. 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 He is symptomatic from bloating/ascites. -Work up including [place], CT and MRI shows 2 large liver masses measuring 3.8 and 8.1 cm with image findings consistent with multifocal HCC. Unfortunately, work up also showed extensive bulky abdominal and retroperitoneal metastasis. A soft tissue deposit in the left paraspinal muscle is palpable on exam and it was biopsied -Baseline AFP normal at 1.1 on  01/10/21 -Unfortunately, he had a stroke on 01/17/21, which has worsened his overall condition. -Biopsy performed on 01/19/21 revealed poorly differentiated carcinoma, favor small cell carcinoma. The primary site is unknown. He has not undergone PET scan. -His overall condition has deteriorated rapidly in the past few weeks, he is lethargic, hypotensive, bedbound at home.  Labs showed worsening acute renal failure -At this point, I do not think he is a candidate for chemotherapy due to his extremely poor performance status and the multiorgan failure.  Patient has previously told his wife that he does not want to go back to the hospital.  His wife and daughter has decided to pursue hospice, which is very reasonable and also my recommendation.  I discussed the option of home hospice, but encouraged her to consider residential hospice.  Patient would like to go home, his wife will take him home -We made urgent hospice referral today to [place] care  -His life expectancy is likely a few days to a few weeks   2. Acute CVA 01/17/21 -possible related to his underlying malignancy, due to hypercoagulopathy. -his stroke has caused increased anxiety, he is on xanax now    3. Goals of care, DNR  -due to his recent stroke and poor PS, he is no longer a candidate for any form of cancer treatment. -We discussed hospice, including residential hospice. He and his wife would like to have home hospice and declined to residential hospice. I will make an urgent referral to hospice    PLAN: -urgent referral to [place] home hospice care today -I will remain to be his attending    No problem-specific Assessment & Plan notes found for this encounter.   No orders of the defined types were placed in this encounter.  All questions were answered. The patient knows to call the clinic with any problems, questions or concerns. No barriers to learning was detected. The total time spent in the appointment was 30 minutes.      Truitt Merle, MD 01/25/2021   I, Wilburn Mylar, am acting as scribe for Truitt Merle, MD.   I have reviewed the above documentation for accuracy and completeness, and I agree with the above.

## 2021-01-29 DIAGNOSIS — R0902 Hypoxemia: Secondary | ICD-10-CM | POA: Diagnosis not present

## 2021-01-29 DIAGNOSIS — I959 Hypotension, unspecified: Secondary | ICD-10-CM | POA: Diagnosis not present

## 2021-01-29 DIAGNOSIS — R404 Transient alteration of awareness: Secondary | ICD-10-CM | POA: Diagnosis not present

## 2021-01-29 DIAGNOSIS — R069 Unspecified abnormalities of breathing: Secondary | ICD-10-CM | POA: Diagnosis not present

## 2021-01-29 DIAGNOSIS — R0602 Shortness of breath: Secondary | ICD-10-CM | POA: Diagnosis not present

## 2021-01-30 ENCOUNTER — Other Ambulatory Visit (HOSPITAL_COMMUNITY): Payer: Medicare Other

## 2021-01-30 ENCOUNTER — Inpatient Hospital Stay: Payer: Medicare Other

## 2021-01-30 ENCOUNTER — Ambulatory Visit (HOSPITAL_COMMUNITY): Payer: Medicare Other

## 2021-01-31 ENCOUNTER — Inpatient Hospital Stay: Payer: Medicare Other

## 2021-02-01 ENCOUNTER — Ambulatory Visit: Payer: Medicare Other

## 2021-02-01 ENCOUNTER — Other Ambulatory Visit: Payer: Medicare Other

## 2021-02-06 ENCOUNTER — Telehealth: Payer: Self-pay

## 2021-02-06 NOTE — Telephone Encounter (Signed)
This nurse received written notification today from access nurse that patient expired 02/01/2021 at 8:24 pm.  MD made aware.

## 2021-02-22 DEATH — deceased

## 2022-06-24 IMAGING — MR MR MRA HEAD W/O CM
2 series · 21 of 48 positions shown · non-contrast
Comparison: No pertinent prior exam.

CLINICAL DATA: Stroke, follow-up

EXAM:
MRA HEAD WITHOUT CONTRAST
TECHNIQUE: Angiographic images of the Circle of Willis were acquired using MRA
technique without intravenous contrast.

[Series 5: vessel_[id number]_head_[id number] · sagittal · 6.0mm · 0.59mm/px · 5 of 20 slices shown]
[im 1/20]
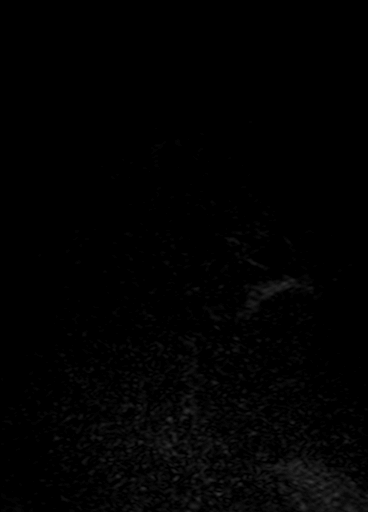
[im 5/20]
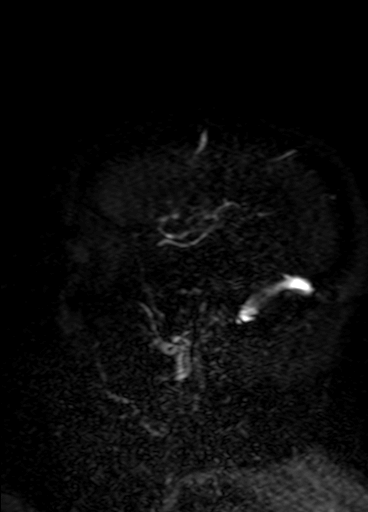
[im 10/20]
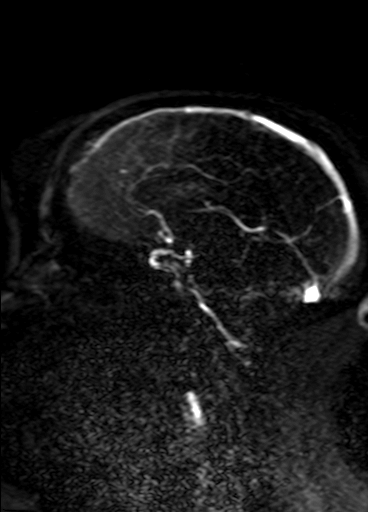
[im 15/20]
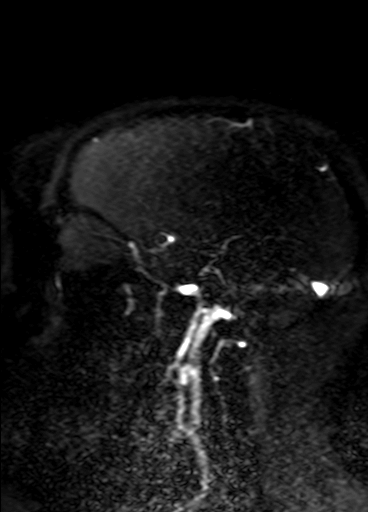
[im 20/20]
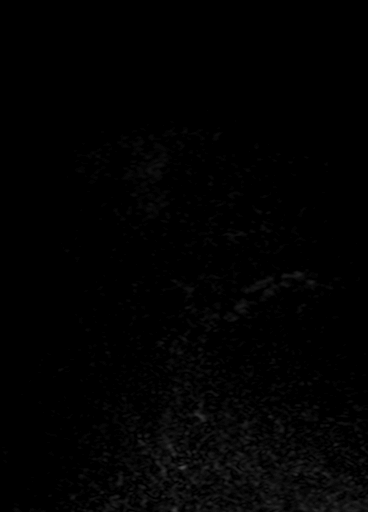

[Series 8: TOF · axial · 0.6mm · 0.35mm/px · z∈[-14,+83]mm · 16 of 172 slices shown]
[im 1/172]
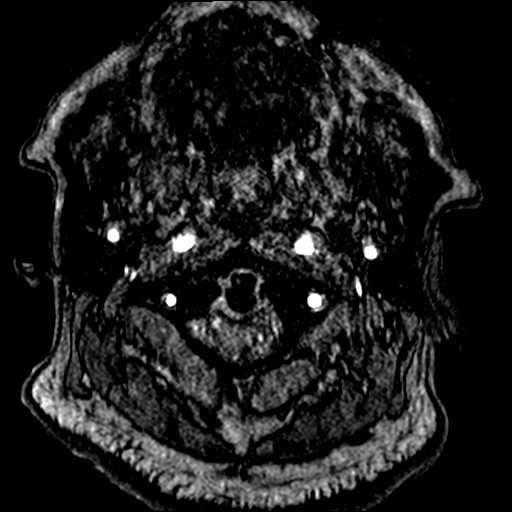
[im 5/172]
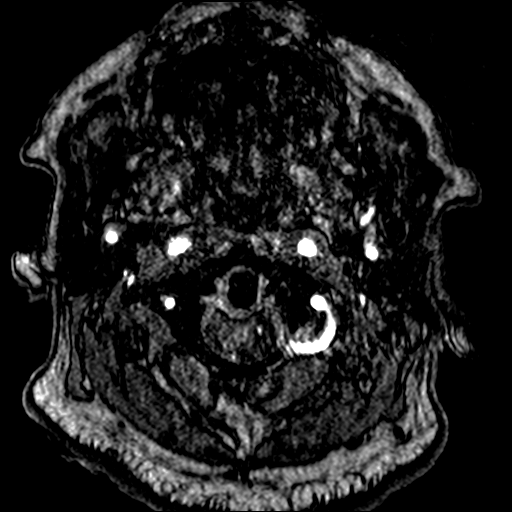
[im 9/172]
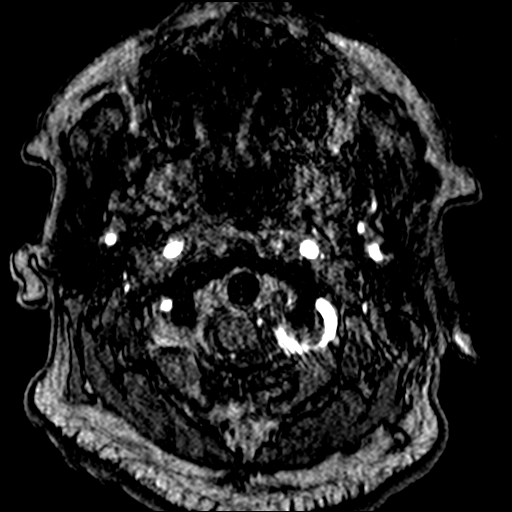
[im 13/172]
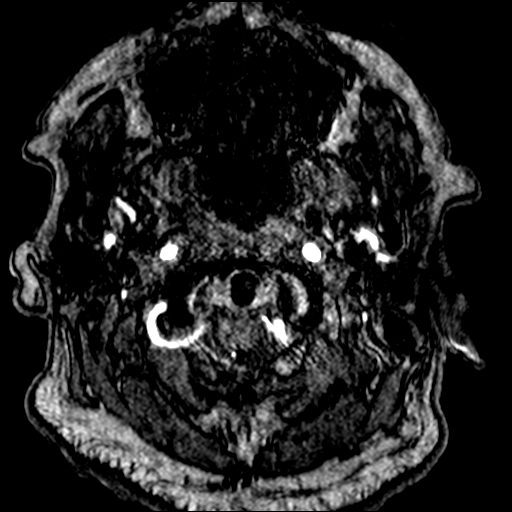
[im 17/172]
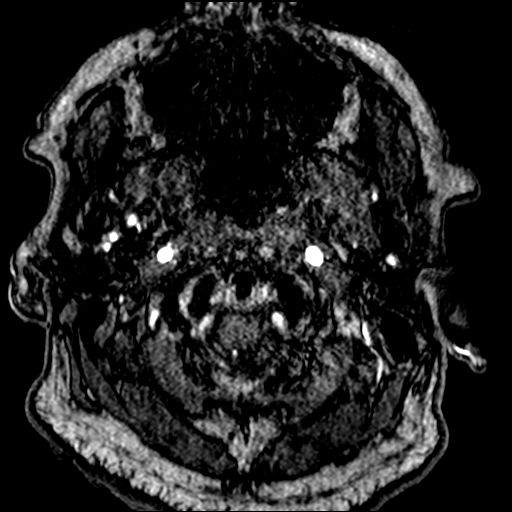
[im 21/172]
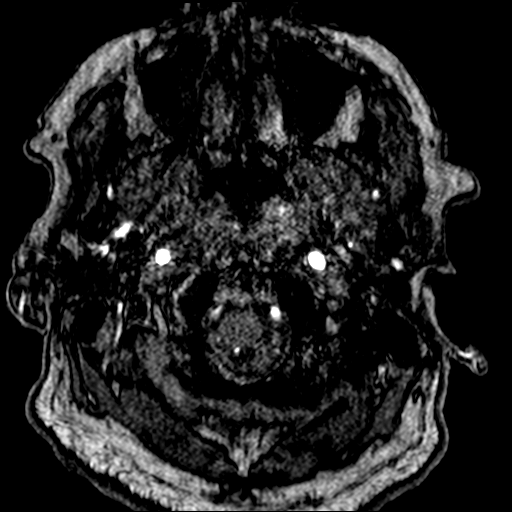
[im 29/172]
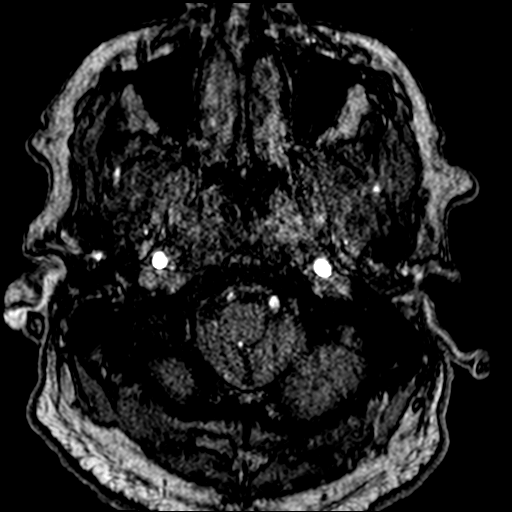
[im 33/172]
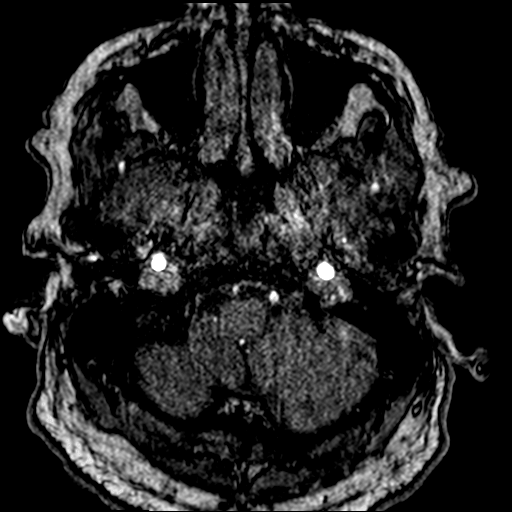
[im 53/172]
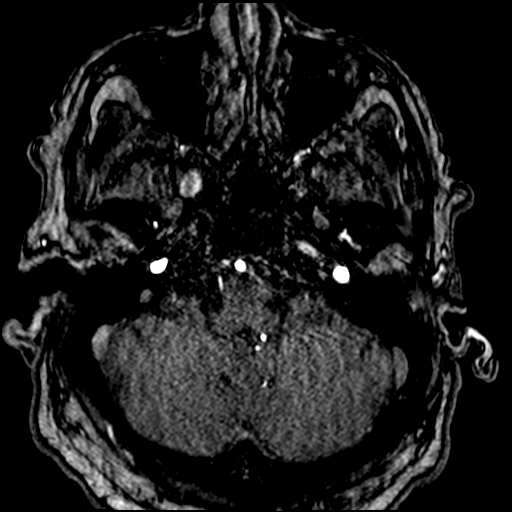
[im 74/172]
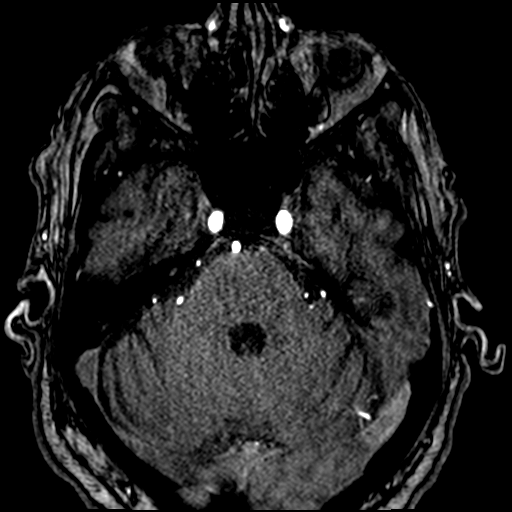
[im 86/172]
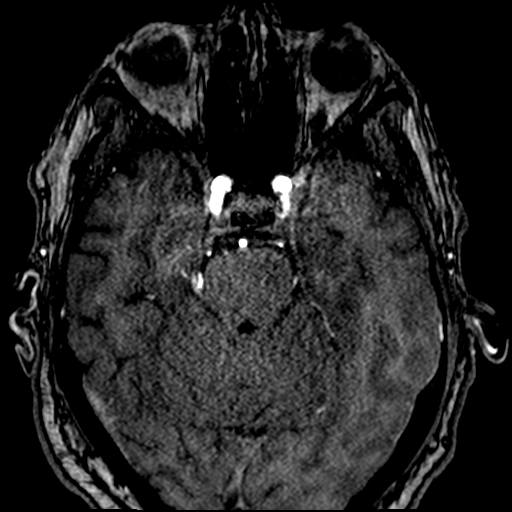
[im 98/172]
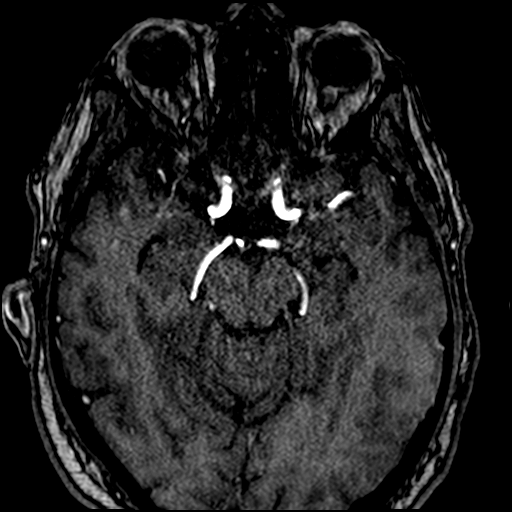
[im 119/172]
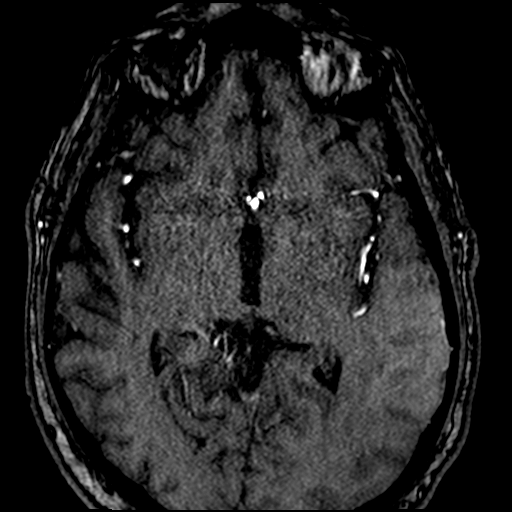
[im 139/172]
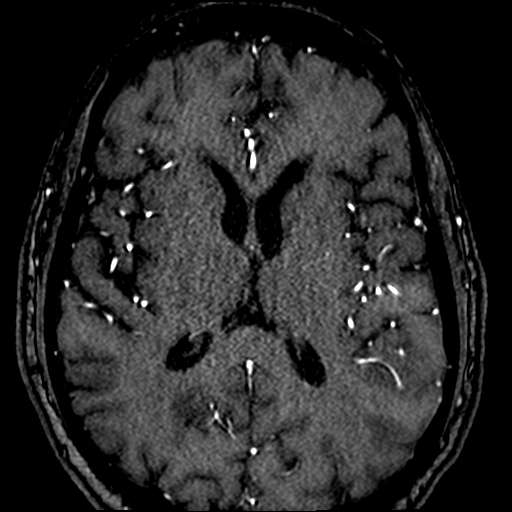
[im 143/172]
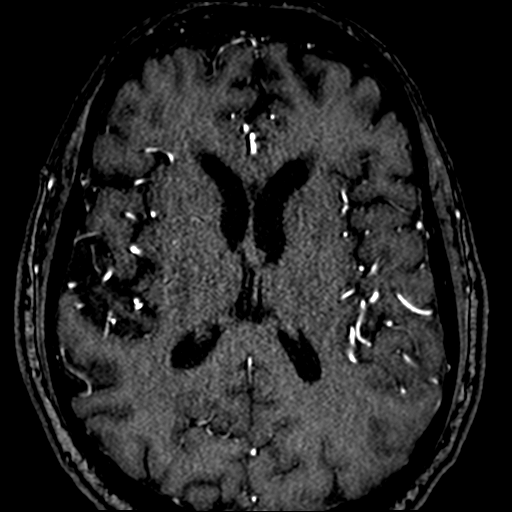
[im 163/172]
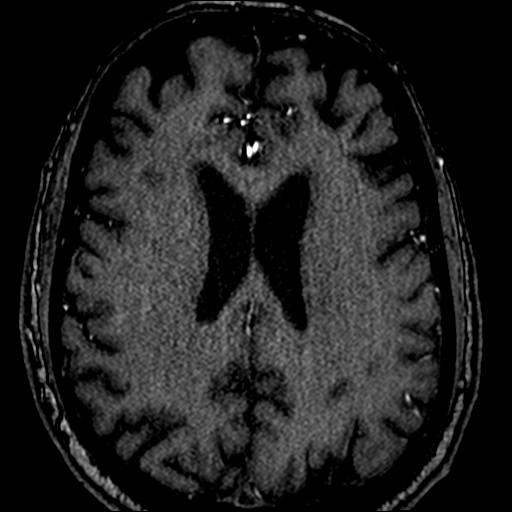

[21 of 48 positions shown; findings below may reference images not displayed]

FINDINGS: Intracranial internal carotid arteries are patent. Middle and
anterior cerebral arteries are patent. Intracranial vertebral
arteries, basilar artery, posterior cerebral arteries are patent.
Bilateral posterior communicating arteries are present. There is no
significant stenosis or aneurysm.
IMPRESSION: No proximal intracranial vessel occlusion or significant stenosis.
